# Patient Record
Sex: Male | Born: 1955 | Race: White | Hispanic: No | Smoking: Current every day smoker
Health system: Southern US, Community
[De-identification: ages and names within clinical notes are randomized; demographics above are authoritative.]

## PROBLEM LIST (undated history)

## (undated) DIAGNOSIS — H53452 Other localized visual field defect, left eye: Secondary | ICD-10-CM

## (undated) DIAGNOSIS — F1010 Alcohol abuse, uncomplicated: Secondary | ICD-10-CM

## (undated) DIAGNOSIS — I639 Cerebral infarction, unspecified: Secondary | ICD-10-CM

## (undated) DIAGNOSIS — J449 Chronic obstructive pulmonary disease, unspecified: Secondary | ICD-10-CM

## (undated) DIAGNOSIS — R0600 Dyspnea, unspecified: Secondary | ICD-10-CM

## (undated) DIAGNOSIS — J189 Pneumonia, unspecified organism: Secondary | ICD-10-CM

## (undated) DIAGNOSIS — H5440 Blindness, one eye, unspecified eye: Secondary | ICD-10-CM

## (undated) DIAGNOSIS — I10 Essential (primary) hypertension: Secondary | ICD-10-CM

## (undated) DIAGNOSIS — J869 Pyothorax without fistula: Secondary | ICD-10-CM

## (undated) DIAGNOSIS — I739 Peripheral vascular disease, unspecified: Secondary | ICD-10-CM

## (undated) HISTORY — PX: TALC PLEURODESIS: SHX2506

## (undated) HISTORY — PX: KNEE SURGERY: SHX244

## (undated) HISTORY — PX: BACK SURGERY: SHX140

## (undated) HISTORY — PX: CLAVICLE SURGERY: SHX598

---
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
## 2018-12-25 DIAGNOSIS — J189 Pneumonia, unspecified organism: Secondary | ICD-10-CM

## 2018-12-25 HISTORY — DX: Pneumonia, unspecified organism: J18.9

## 2019-09-07 ENCOUNTER — Other Ambulatory Visit: Payer: Self-pay

## 2019-09-07 ENCOUNTER — Inpatient Hospital Stay (HOSPITAL_COMMUNITY): Payer: Medicaid Other

## 2019-09-07 ENCOUNTER — Encounter (HOSPITAL_COMMUNITY): Payer: Self-pay | Admitting: Emergency Medicine

## 2019-09-07 ENCOUNTER — Emergency Department (HOSPITAL_COMMUNITY): Payer: Medicaid Other

## 2019-09-07 ENCOUNTER — Inpatient Hospital Stay (HOSPITAL_COMMUNITY)
Admission: EM | Admit: 2019-09-07 | Discharge: 2019-09-09 | DRG: 190 | Disposition: A | Discharge status: Home / Self Care | Payer: Medicaid Other | Attending: Family Medicine | Admitting: Family Medicine

## 2019-09-07 DIAGNOSIS — I6523 Occlusion and stenosis of bilateral carotid arteries: Secondary | ICD-10-CM | POA: Diagnosis present

## 2019-09-07 DIAGNOSIS — J9601 Acute respiratory failure with hypoxia: Secondary | ICD-10-CM | POA: Diagnosis present

## 2019-09-07 DIAGNOSIS — F17210 Nicotine dependence, cigarettes, uncomplicated: Secondary | ICD-10-CM | POA: Diagnosis present

## 2019-09-07 DIAGNOSIS — R0789 Other chest pain: Secondary | ICD-10-CM

## 2019-09-07 DIAGNOSIS — K8020 Calculus of gallbladder without cholecystitis without obstruction: Secondary | ICD-10-CM | POA: Diagnosis present

## 2019-09-07 DIAGNOSIS — I739 Peripheral vascular disease, unspecified: Secondary | ICD-10-CM | POA: Diagnosis present

## 2019-09-07 DIAGNOSIS — I771 Stricture of artery: Secondary | ICD-10-CM

## 2019-09-07 DIAGNOSIS — I10 Essential (primary) hypertension: Secondary | ICD-10-CM | POA: Diagnosis present

## 2019-09-07 DIAGNOSIS — W19XXXA Unspecified fall, initial encounter: Secondary | ICD-10-CM | POA: Diagnosis present

## 2019-09-07 DIAGNOSIS — R0902 Hypoxemia: Secondary | ICD-10-CM

## 2019-09-07 DIAGNOSIS — F10231 Alcohol dependence with withdrawal delirium: Secondary | ICD-10-CM | POA: Diagnosis present

## 2019-09-07 DIAGNOSIS — H548 Legal blindness, as defined in USA: Secondary | ICD-10-CM | POA: Diagnosis present

## 2019-09-07 DIAGNOSIS — J440 Chronic obstructive pulmonary disease with acute lower respiratory infection: Secondary | ICD-10-CM | POA: Diagnosis present

## 2019-09-07 DIAGNOSIS — Z20828 Contact with and (suspected) exposure to other viral communicable diseases: Secondary | ICD-10-CM | POA: Diagnosis present

## 2019-09-07 DIAGNOSIS — R55 Syncope and collapse: Secondary | ICD-10-CM

## 2019-09-07 DIAGNOSIS — J441 Chronic obstructive pulmonary disease with (acute) exacerbation: Principal | ICD-10-CM

## 2019-09-07 DIAGNOSIS — F10931 Alcohol use, unspecified with withdrawal delirium: Secondary | ICD-10-CM | POA: Diagnosis present

## 2019-09-07 DIAGNOSIS — R945 Abnormal results of liver function studies: Secondary | ICD-10-CM

## 2019-09-07 DIAGNOSIS — J189 Pneumonia, unspecified organism: Secondary | ICD-10-CM | POA: Diagnosis not present

## 2019-09-07 DIAGNOSIS — J449 Chronic obstructive pulmonary disease, unspecified: Secondary | ICD-10-CM | POA: Diagnosis present

## 2019-09-07 DIAGNOSIS — K7010 Alcoholic hepatitis without ascites: Secondary | ICD-10-CM | POA: Diagnosis present

## 2019-09-07 HISTORY — DX: Essential (primary) hypertension: I10

## 2019-09-07 HISTORY — DX: Alcohol abuse, uncomplicated: F10.10

## 2019-09-07 HISTORY — DX: Other localized visual field defect, left eye: H53.452

## 2019-09-07 HISTORY — DX: Chronic obstructive pulmonary disease, unspecified: J44.9

## 2019-09-07 HISTORY — DX: Pyothorax without fistula: J86.9

## 2019-09-07 HISTORY — DX: Blindness, one eye, unspecified eye: H54.40

## 2019-09-07 LAB — COMPREHENSIVE METABOLIC PANEL
ALT: 64 U/L — ABNORMAL HIGH (ref 0–44)
ALT: 63 U/L — ABNORMAL HIGH (ref 0–44)
AST: 56 U/L — ABNORMAL HIGH (ref 15–41)
AST: 52 U/L — ABNORMAL HIGH (ref 15–41)
Albumin: 4.1 g/dL (ref 3.5–5.0)
Albumin: 4 g/dL (ref 3.5–5.0)
Alkaline Phosphatase: 62 U/L (ref 38–126)
Alkaline Phosphatase: 49 U/L (ref 38–126)
Anion gap: 13 (ref 5–15)
Anion gap: 14 (ref 5–15)
BUN: 9 mg/dL (ref 8–23)
BUN: 9 mg/dL (ref 8–23)
CO2: 23 mmol/L (ref 22–32)
CO2: 20 mmol/L — ABNORMAL LOW (ref 22–32)
Calcium: 8.4 mg/dL — ABNORMAL LOW (ref 8.9–10.3)
Calcium: 8.4 mg/dL — ABNORMAL LOW (ref 8.9–10.3)
Chloride: 103 mmol/L (ref 98–111)
Chloride: 106 mmol/L (ref 98–111)
Creatinine, Ser: 0.72 mg/dL (ref 0.61–1.24)
Creatinine, Ser: 0.6 mg/dL — ABNORMAL LOW (ref 0.61–1.24)
GFR calc Af Amer: >60 mL/min (ref >60)
GFR calc Af Amer: >60 mL/min (ref >60)
GFR calc non Af Amer: >60 mL/min (ref >60)
GFR calc non Af Amer: >60 mL/min (ref >60)
Glucose, Bld: 141 mg/dL — ABNORMAL HIGH (ref 70–99)
Glucose, Bld: 173 mg/dL — ABNORMAL HIGH (ref 70–99)
Potassium: 4.1 mmol/L (ref 3.5–5.1)
Potassium: 3.9 mmol/L (ref 3.5–5.1)
Sodium: 139 mmol/L (ref 135–145)
Sodium: 140 mmol/L (ref 135–145)
Total Bilirubin: 1 mg/dL (ref 0.3–1.2)
Total Bilirubin: 1.1 mg/dL (ref 0.3–1.2)
Total Protein: 7.8 g/dL (ref 6.5–8.1)
Total Protein: 8 g/dL (ref 6.5–8.1)

## 2019-09-07 LAB — PHOSPHORUS: Phosphorus: 2.4 mg/dL — ABNORMAL LOW (ref 2.5–4.6)

## 2019-09-07 LAB — CBC
HCT: 53.8 % — ABNORMAL HIGH (ref 39.0–52.0)
Hemoglobin: 18.4 g/dL — ABNORMAL HIGH (ref 13.0–17.0)
MCH: 34.1 pg — ABNORMAL HIGH (ref 26.0–34.0)
MCHC: 34.2 g/dL (ref 30.0–36.0)
MCV: 99.8 fL (ref 80.0–100.0)
Platelets: 152 10*3/uL (ref 150–400)
RBC: 5.39 MIL/uL (ref 4.22–5.81)
RDW: 12.6 % (ref 11.5–15.5)
WBC: 5.5 10*3/uL (ref 4.0–10.5)
nRBC: 0 % (ref 0.0–0.2)

## 2019-09-07 LAB — ETHANOL: Alcohol, Ethyl (B): 139 mg/dL — ABNORMAL HIGH (ref <10)

## 2019-09-07 LAB — CBC WITH DIFFERENTIAL/PLATELET
Abs Immature Granulocytes: 0.03 10*3/uL (ref 0.00–0.07)
Basophils Absolute: 0.1 10*3/uL (ref 0.0–0.1)
Basophils Relative: 1 %
Eosinophils Absolute: 0.1 10*3/uL (ref 0.0–0.5)
Eosinophils Relative: 1 %
HCT: 55.1 % — ABNORMAL HIGH (ref 39.0–52.0)
Hemoglobin: 18.5 g/dL — ABNORMAL HIGH (ref 13.0–17.0)
Immature Granulocytes: 0 %
Lymphocytes Relative: 32 %
Lymphs Abs: 2.2 10*3/uL (ref 0.7–4.0)
MCH: 33.5 pg (ref 26.0–34.0)
MCHC: 33.6 g/dL (ref 30.0–36.0)
MCV: 99.6 fL (ref 80.0–100.0)
Monocytes Absolute: 0.4 10*3/uL (ref 0.1–1.0)
Monocytes Relative: 6 %
Neutro Abs: 4.2 10*3/uL (ref 1.7–7.7)
Neutrophils Relative %: 60 %
Platelets: 191 10*3/uL (ref 150–400)
RBC: 5.53 MIL/uL (ref 4.22–5.81)
RDW: 12.7 % (ref 11.5–15.5)
WBC: 6.9 10*3/uL (ref 4.0–10.5)
nRBC: 0 % (ref 0.0–0.2)

## 2019-09-07 LAB — SARS CORONAVIRUS 2 BY RT PCR (HOSPITAL ORDER, PERFORMED IN ~~LOC~~ HOSPITAL LAB): SARS Coronavirus 2: NEGATIVE

## 2019-09-07 LAB — CK TOTAL AND CKMB (NOT AT ARMC)
CK, MB: 4.7 ng/mL (ref 0.5–5.0)
Relative Index: 4.3 — ABNORMAL HIGH (ref 0.0–2.5)
Total CK: 109 U/L (ref 49–397)

## 2019-09-07 LAB — BLOOD GAS, ARTERIAL
Acid-base deficit: 5 mmol/L — ABNORMAL HIGH (ref 0.0–2.0)
Bicarbonate: 20.2 mmol/L (ref 20.0–28.0)
FIO2: 21
O2 Saturation: 88 %
Patient temperature: 37
pCO2 arterial: 36.6 mmHg (ref 32.0–48.0)
pH, Arterial: 7.348 — ABNORMAL LOW (ref 7.350–7.450)
pO2, Arterial: 59.9 mmHg — ABNORMAL LOW (ref 83.0–108.0)

## 2019-09-07 LAB — TROPONIN I (HIGH SENSITIVITY): Troponin I (High Sensitivity): 5 ng/L (ref <18)

## 2019-09-07 LAB — STREP PNEUMONIAE URINARY ANTIGEN: Strep Pneumo Urinary Antigen: NEGATIVE

## 2019-09-07 LAB — MAGNESIUM: Magnesium: 1.9 mg/dL (ref 1.7–2.4)

## 2019-09-07 MED ORDER — ALBUTEROL SULFATE (2.5 MG/3ML) 0.083% IN NEBU
2.5000 mg | INHALATION_SOLUTION | Freq: Four times a day (QID) | RESPIRATORY_TRACT | Status: DC
  Administered 2019-09-07 – 2019-09-09 (×10): 2.5 mg via RESPIRATORY_TRACT
  Filled 2019-09-07 (×11): qty 3

## 2019-09-07 MED ORDER — MORPHINE SULFATE (PF) 4 MG/ML IV SOLN
4.0000 mg | Freq: Four times a day (QID) | INTRAVENOUS | Status: DC | PRN
  Administered 2019-09-07 – 2019-09-09 (×5): 4 mg via INTRAVENOUS
  Filled 2019-09-07 (×5): qty 1

## 2019-09-07 MED ORDER — SODIUM CHLORIDE 0.9 % IV SOLN
500.0000 mg | INTRAVENOUS | Status: DC
  Administered 2019-09-08 – 2019-09-09 (×2): 500 mg via INTRAVENOUS
  Filled 2019-09-07 (×2): qty 500

## 2019-09-07 MED ORDER — SODIUM CHLORIDE 0.9 % IV SOLN
500.0000 mg | Freq: Once | INTRAVENOUS | Status: AC
  Administered 2019-09-07: 500 mg via INTRAVENOUS
  Filled 2019-09-07: qty 500

## 2019-09-07 MED ORDER — CHLORDIAZEPOXIDE HCL 25 MG PO CAPS
50.0000 mg | ORAL_CAPSULE | Freq: Once | ORAL | Status: DC
  Filled 2019-09-07: qty 2

## 2019-09-07 MED ORDER — ACETAMINOPHEN 650 MG RE SUPP: 650.0000 mg | Freq: Four times a day (QID) | RECTAL | Status: DC | PRN

## 2019-09-07 MED ORDER — ACETAMINOPHEN 325 MG PO TABS: 650.0000 mg | ORAL_TABLET | Freq: Four times a day (QID) | ORAL | Status: DC | PRN

## 2019-09-07 MED ORDER — ALBUTEROL SULFATE HFA 108 (90 BASE) MCG/ACT IN AERS
8.0000 | INHALATION_SPRAY | Freq: Once | RESPIRATORY_TRACT | Status: AC
  Administered 2019-09-07: 02:00:00 8 via RESPIRATORY_TRACT
  Filled 2019-09-07: qty 6.7

## 2019-09-07 MED ORDER — SODIUM CHLORIDE 0.9 % IV SOLN
1.0000 g | INTRAVENOUS | Status: DC
  Administered 2019-09-08 – 2019-09-09 (×2): 1 g via INTRAVENOUS
  Filled 2019-09-07 (×2): qty 10

## 2019-09-07 MED ORDER — CYCLOBENZAPRINE HCL 10 MG PO TABS
10.0000 mg | ORAL_TABLET | Freq: Once | ORAL | Status: AC
  Administered 2019-09-07: 10 mg via ORAL
  Filled 2019-09-07: qty 1

## 2019-09-07 MED ORDER — THIAMINE HCL 100 MG/ML IJ SOLN
100.0000 mg | Freq: Every day | INTRAMUSCULAR | Status: DC
  Filled 2019-09-07 (×2): qty 2

## 2019-09-07 MED ORDER — KETOROLAC TROMETHAMINE 30 MG/ML IJ SOLN
30.0000 mg | Freq: Once | INTRAMUSCULAR | Status: AC
  Administered 2019-09-07: 04:00:00 30 mg via INTRAVENOUS
  Filled 2019-09-07: qty 1

## 2019-09-07 MED ORDER — METHYLPREDNISOLONE SODIUM SUCC 40 MG IJ SOLR
40.0000 mg | Freq: Two times a day (BID) | INTRAMUSCULAR | Status: DC
  Administered 2019-09-07 – 2019-09-09 (×4): 40 mg via INTRAVENOUS
  Filled 2019-09-07 (×4): qty 1

## 2019-09-07 MED ORDER — VITAMIN B-1 100 MG PO TABS
100.0000 mg | ORAL_TABLET | Freq: Every day | ORAL | Status: DC
  Administered 2019-09-07 – 2019-09-09 (×3): 100 mg via ORAL
  Filled 2019-09-07 (×3): qty 1

## 2019-09-07 MED ORDER — CHLORDIAZEPOXIDE HCL 25 MG PO CAPS
25.0000 mg | ORAL_CAPSULE | Freq: Once | ORAL | Status: AC
  Administered 2019-09-08: 25 mg via ORAL
  Filled 2019-09-07: qty 1

## 2019-09-07 MED ORDER — FOLIC ACID 1 MG PO TABS
1.0000 mg | ORAL_TABLET | Freq: Every day | ORAL | Status: DC
  Administered 2019-09-07 – 2019-09-09 (×3): 1 mg via ORAL
  Filled 2019-09-07 (×3): qty 1

## 2019-09-07 MED ORDER — SODIUM CHLORIDE 0.9 % IV SOLN: 250.0000 mL | INTRAVENOUS | Status: DC | PRN

## 2019-09-07 MED ORDER — SODIUM CHLORIDE 0.9% FLUSH: 3.0000 mL | INTRAVENOUS | Status: DC | PRN

## 2019-09-07 MED ORDER — AMLODIPINE BESYLATE 5 MG PO TABS
10.0000 mg | ORAL_TABLET | Freq: Every day | ORAL | Status: DC
  Administered 2019-09-07 – 2019-09-09 (×3): 10 mg via ORAL
  Filled 2019-09-07 (×3): qty 2

## 2019-09-07 MED ORDER — ALBUTEROL SULFATE (2.5 MG/3ML) 0.083% IN NEBU: 2.5000 mg | INHALATION_SOLUTION | Freq: Four times a day (QID) | RESPIRATORY_TRACT | Status: DC | PRN

## 2019-09-07 MED ORDER — ENOXAPARIN SODIUM 40 MG/0.4ML ~~LOC~~ SOLN
40.0000 mg | SUBCUTANEOUS | Status: DC
  Administered 2019-09-08 – 2019-09-09 (×2): 40 mg via SUBCUTANEOUS
  Filled 2019-09-07 (×2): qty 0.4

## 2019-09-07 MED ORDER — METHYLPREDNISOLONE SODIUM SUCC 125 MG IJ SOLR
80.0000 mg | Freq: Three times a day (TID) | INTRAMUSCULAR | Status: DC
  Administered 2019-09-07: 80 mg via INTRAVENOUS
  Filled 2019-09-07: qty 2

## 2019-09-07 MED ORDER — LORAZEPAM 2 MG/ML IJ SOLN
0.0000 mg | Freq: Four times a day (QID) | INTRAMUSCULAR | Status: AC
  Administered 2019-09-07: 2 mg via INTRAVENOUS
  Administered 2019-09-07: 12:00:00 1 mg via INTRAVENOUS
  Administered 2019-09-08: 18:00:00 2 mg via INTRAVENOUS
  Filled 2019-09-07 (×4): qty 1

## 2019-09-07 MED ORDER — IOHEXOL 350 MG/ML SOLN
100.0000 mL | Freq: Once | INTRAVENOUS | Status: AC | PRN
  Administered 2019-09-07: 09:00:00 100 mL via INTRAVENOUS

## 2019-09-07 MED ORDER — PERFLUTREN LIPID MICROSPHERE
1.0000 mL | INTRAVENOUS | Status: AC | PRN
  Administered 2019-09-07: 4 mL via INTRAVENOUS
  Filled 2019-09-07: qty 10

## 2019-09-07 MED ORDER — LORAZEPAM 2 MG/ML IJ SOLN
1.0000 mg | INTRAMUSCULAR | Status: DC | PRN
  Administered 2019-09-07 – 2019-09-08 (×2): 2 mg via INTRAVENOUS
  Filled 2019-09-07 (×3): qty 1

## 2019-09-07 MED ORDER — LORAZEPAM 2 MG/ML IJ SOLN
0.0000 mg | Freq: Two times a day (BID) | INTRAMUSCULAR | Status: DC
  Administered 2019-09-09: 2 mg via INTRAVENOUS

## 2019-09-07 MED ORDER — NICOTINE 21 MG/24HR TD PT24
21.0000 mg | MEDICATED_PATCH | Freq: Every day | TRANSDERMAL | Status: DC | PRN
  Filled 2019-09-07: qty 1

## 2019-09-07 MED ORDER — UMECLIDINIUM BROMIDE 62.5 MCG/INH IN AEPB
1.0000 | INHALATION_SPRAY | Freq: Every day | RESPIRATORY_TRACT | Status: DC
  Administered 2019-09-07 – 2019-09-09 (×3): 1 via RESPIRATORY_TRACT
  Filled 2019-09-07: qty 7

## 2019-09-07 MED ORDER — OXYCODONE HCL 5 MG PO TABS
5.0000 mg | ORAL_TABLET | ORAL | Status: DC | PRN
  Administered 2019-09-07 – 2019-09-09 (×5): 5 mg via ORAL
  Filled 2019-09-07 (×6): qty 1

## 2019-09-07 MED ORDER — LORAZEPAM 1 MG PO TABS: 1.0000 mg | ORAL_TABLET | ORAL | Status: DC | PRN

## 2019-09-07 MED ORDER — ADULT MULTIVITAMIN W/MINERALS CH
1.0000 | ORAL_TABLET | Freq: Every day | ORAL | Status: DC
  Administered 2019-09-07 – 2019-09-09 (×3): 1 via ORAL
  Filled 2019-09-07 (×3): qty 1

## 2019-09-07 MED ORDER — LABETALOL HCL 5 MG/ML IV SOLN
10.0000 mg | INTRAVENOUS | Status: DC | PRN
  Administered 2019-09-07 (×2): 10 mg via INTRAVENOUS
  Filled 2019-09-07: qty 4

## 2019-09-07 MED ORDER — METHYLPREDNISOLONE SODIUM SUCC 125 MG IJ SOLR
125.0000 mg | Freq: Once | INTRAMUSCULAR | Status: AC
  Administered 2019-09-07: 02:00:00 125 mg via INTRAVENOUS
  Filled 2019-09-07: qty 2

## 2019-09-07 MED ORDER — SODIUM CHLORIDE 0.9 % IV SOLN
1.0000 g | Freq: Once | INTRAVENOUS | Status: AC
  Administered 2019-09-07: 04:00:00 1 g via INTRAVENOUS
  Filled 2019-09-07: qty 10

## 2019-09-07 MED ORDER — MORPHINE SULFATE (PF) 2 MG/ML IV SOLN
1.0000 mg | INTRAVENOUS | Status: DC | PRN
  Administered 2019-09-07 (×2): 1 mg via INTRAVENOUS
  Filled 2019-09-07 (×2): qty 1

## 2019-09-07 NOTE — ED Provider Notes (Signed)
[facility] EMERGENCY DEPARTMENT Provider Note   CSN: [id number] Arrival date & time: 09/07/19  0118   Time seen 1:34 AM  History   Chief Complaint Chief Complaint  Patient presents with  . Shortness of Breath    HPI Nathan Hart is a 63 y.o. male.     HPI patient states he has not seen a doctor in 3 years.  He states he has been diagnosed with COPD but he is not on home oxygen or any specific medications for it.  He states he continues to smoke 1-1/2 packs a day.  He relates he also had pneumococcal pneumonia about 10 years ago and had a thoracotomy done I suspect for a empyema.  He states he has a chronic daily cough that he states is not any worse.  He is unaware of fever and denies chills.  He has chronic rhinorrhea.  He denies sore throat, nausea, vomiting, or diarrhea.  He states he started feeling worse the evening of the 11th with cramps in his ribs and wheezing.  Patient denies being around anybody else who is sick.  He states he has been living in a camper at his son's house so he can avoid social interaction due to [place].  PCP none  Past Medical History:  Diagnosis Date  . COPD (chronic obstructive pulmonary disease) (HCC)     There are no active problems to display for this patient.   History reviewed. No pertinent surgical history.      Home Medications    Prior to Admission medications   Not on File    Family History No family history on file.  Social History Social History   Tobacco Use  . Smoking status: Not on file  Substance Use Topics  . Alcohol use: Not on file  . Drug use: Not on file  Patient moved here 3 years ago Patient states he is living in a camper at his son's house Patient is on disability for being legally blind   Allergies   Patient has no allergy information on record.   Review of Systems Review of Systems  All other systems reviewed and are negative.    Physical Exam Updated Vital Signs BP (!) 146/86    Pulse 95   Temp 98.4 F (36.9 C) (Oral)   Resp (!) 29   Ht 5\' 10"  (1.778 m)   Wt 79.4 kg   SpO2 91%   BMI 25.11 kg/m   Physical Exam Vitals signs and nursing note reviewed.  Constitutional:      General: He is in acute distress.     Appearance: He is well-developed.  HENT:     Head: Normocephalic and atraumatic.     Right Ear: External ear normal.     Left Ear: External ear normal.     Mouth/Throat:     Mouth: Mucous membranes are dry.  Eyes:     Extraocular Movements: Extraocular movements intact.     Conjunctiva/sclera: Conjunctivae normal.     Pupils: Pupils are equal, round, and reactive to light.  Neck:     Musculoskeletal: Normal range of motion.  Cardiovascular:     Rate and Rhythm: Normal rate and regular rhythm.  Pulmonary:     [place]: Tachypnea, accessory muscle usage, prolonged expiration and respiratory distress present.     Breath sounds: Decreased breath sounds and wheezing present.     Comments: Patient is clutching his chest when he tries to move. Musculoskeletal: Normal range of motion.  General: No swelling or tenderness.  Skin:    General: Skin is warm and dry.  Neurological:     General: No focal deficit present.     Mental Status: He is alert and oriented to person, place, and time.     Cranial Nerves: No cranial nerve deficit.  Psychiatric:        Mood and Affect: Mood is anxious.        Speech: Speech is rapid and pressured.        Behavior: Behavior is cooperative.      ED Treatments / Results  Labs (all labs ordered are listed, but only abnormal results are displayed) Results for orders placed or performed during the hospital encounter of 09/07/19  SARS Coronavirus 2 [facility] order, Performed in [facility] hospital lab) Nasopharyngeal Nasopharyngeal Swab   Specimen: Nasopharyngeal Swab  Result Value Ref Range   SARS Coronavirus 2 NEGATIVE NEGATIVE  Comprehensive metabolic panel  Result Value Ref Range   Sodium 139 135 - 145  mmol/L   Potassium 4.1 3.5 - 5.1 mmol/L   Chloride 103 98 - 111 mmol/L   CO2 23 22 - 32 mmol/L   Glucose, Bld 141 (H) 70 - 99 mg/dL   BUN 9 8 - 23 mg/dL   Creatinine, Ser 0.72 0.61 - 1.24 mg/dL   Calcium 8.4 (L) 8.9 - 10.3 mg/dL   Total Protein 7.8 6.5 - 8.1 g/dL   Albumin 4.1 3.5 - 5.0 g/dL   AST 56 (H) 15 - 41 U/L   ALT 64 (H) 0 - 44 U/L   Alkaline Phosphatase 62 38 - 126 U/L   Total Bilirubin 1.0 0.3 - 1.2 mg/dL   GFR calc non Af Amer >60 >60 mL/min   GFR calc Af Amer >60 >60 mL/min   Anion gap 13 5 - 15  CBC with Differential  Result Value Ref Range   WBC 6.9 4.0 - 10.5 K/uL   RBC 5.53 4.22 - 5.81 MIL/uL   Hemoglobin 18.5 (H) 13.0 - 17.0 g/dL   HCT 55.1 (H) 39.0 - 52.0 %   MCV 99.6 80.0 - 100.0 fL   MCH 33.5 26.0 - 34.0 pg   MCHC 33.6 30.0 - 36.0 g/dL   RDW 12.7 11.5 - 15.5 %   Platelets 191 150 - 400 K/uL   nRBC 0.0 0.0 - 0.2 %   Neutrophils Relative % 60 %   Neutro Abs 4.2 1.7 - 7.7 K/uL   Lymphocytes Relative 32 %   Lymphs Abs 2.2 0.7 - 4.0 K/uL   Monocytes Relative 6 %   Monocytes Absolute 0.4 0.1 - 1.0 K/uL   Eosinophils Relative 1 %   Eosinophils Absolute 0.1 0.0 - 0.5 K/uL   Basophils Relative 1 %   Basophils Absolute 0.1 0.0 - 0.1 K/uL   Immature Granulocytes 0 %   Abs Immature Granulocytes 0.03 0.00 - 0.07 K/uL  Blood gas, arterial  Result Value Ref Range   FIO2 21.00    pH, Arterial 7.348 (L) 7.350 - 7.450   pCO2 arterial 36.6 32.0 - 48.0 mmHg   pO2, Arterial 59.9 (L) 83.0 - 108.0 mmHg   Bicarbonate 20.2 20.0 - 28.0 mmol/L   Acid-base deficit 5.0 (H) 0.0 - 2.0 mmol/L   O2 Saturation 88.0 %   Patient temperature 37.0    Allens test (pass/fail) PASS PASS   Laboratory interpretation all normal except hypoxia on room air ABG, elevation of LFTs, elevated hemoglobin without elevated white blood cell  count   EKG EKG Interpretation  Date/Time:  Sunday September 07 2019 01:23:53 EDT Ventricular Rate:  94 PR Interval:    QRS Duration: 91 QT Interval:   363 QTC Calculation: 454 R Axis:   80 Text Interpretation:  Sinus rhythm Anteroseptal infarct, old Borderline repolarization abnormality Baseline wander in lead(s) II III aVF Electrode noise No old tracing to compare Confirmed by Rolland Porter [id number]) on 09/07/2019 1:31:55 AM   Radiology Dg Chest Port 1 View  Result Date: 09/07/2019 CLINICAL DATA:  Wheezing and shortness of breath. History of COPD. EXAM: PORTABLE CHEST 1 VIEW COMPARISON:  None. FINDINGS: Heart is normal in size. Aortic atherosclerosis. Mild bilateral hilar prominence. There is central peribronchial thickening. Patchy bibasilar opacities. No pleural effusion, right costophrenic angle not entirely included in the field of view. No pneumothorax. IMPRESSION: 1. Central bronchial thickening. Patchy bibasilar opacities, atelectasis versus pneumonia. 2. Mild bilateral hilar prominence is likely related to overlapping vascular structures, but nonspecific. Aortic Atherosclerosis (ICD10-I70.0). Electronically Signed   By: Keith Rake M.D.   On: 09/07/2019 02:08    Procedures .Critical Care Performed by: Rolland Porter, MD Authorized by: Rolland Porter, MD   Critical care provider statement:    Critical care time (minutes):  33   Critical care was necessary to treat or prevent imminent or life-threatening deterioration of the following conditions:  Respiratory failure   Critical care was time spent personally by me on the following activities:  Discussions with consultants, examination of patient, ordering and review of laboratory studies, ordering and review of radiographic studies, pulse oximetry and re-evaluation of patient's condition   (including critical care time)  Medications Ordered in ED Medications  azithromycin (ZITHROMAX) 500 mg in sodium chloride 0.9 % 250 mL IVPB (500 mg Intravenous New Bag/Given 09/07/19 0342)  albuterol (VENTOLIN HFA) 108 (90 Base) MCG/ACT inhaler 8 puff (8 puffs Inhalation Given 09/07/19 0156)   methylPREDNISolone sodium succinate (SOLU-MEDROL) 125 mg/2 mL injection 125 mg (125 mg Intravenous Given 09/07/19 0207)  cefTRIAXone (ROCEPHIN) 1 g in sodium chloride 0.9 % 100 mL IVPB (0 g Intravenous Stopped 09/07/19 0416)  ketorolac (TORADOL) 30 MG/ML injection 30 mg (30 mg Intravenous Given 09/07/19 0337)  cyclobenzaprine (FLEXERIL) tablet 10 mg (10 mg Oral Given 09/07/19 [id number])     Initial Impression / Assessment and Plan / ED Course  I have reviewed the triage vital signs and the nursing notes.  Pertinent labs & imaging results that were available during my care of the patient were reviewed by me and considered in my medical decision making (see chart for details).    Patient has no old records here.   Patient was given albuterol inhaler x8 puffs, and IV Solu-Medrol for suspected COPD exacerbation.  Recheck at 3:30 AM patient is very dramatic about his chest wall pain.  He was given IV Toradol and oral Flexeril.  When I listen to email the wheezing is gone however he has rales at the bases.  When I look in his x-ray they described bibasilar infiltrates.  He was started on community-acquired pneumonia antibiotics.  4:15 AM Dr. Maudie Mercury, hospitalist will admit  Final Clinical Impressions(s) / ED Diagnoses   Final diagnoses:  COPD exacerbation ([place])  Hypoxia  Community acquired pneumonia, unspecified laterality  Chest wall pain    Plan admission  Rolland Porter, MD, Barbette Or, MD 09/07/19 [id number]

## 2019-09-07 NOTE — Progress Notes (Signed)
  Echocardiogram 2D Echocardiogram with definity has been performed.  Darlina Sicilian M 09/07/2019, 4:04 PM

## 2019-09-07 NOTE — H&P (Signed)
TRH H&P    Patient Demographics:    Nathan Hart, is a 63 y.o. male  MRN: [id number]  DOB - December 17, 1956  Admit Date - 09/07/2019  Referring MD/NP/PA:   Rolland Porter  Outpatient Primary MD for the patient is Patient, No Pcp Per  Patient coming from:  home  Chief complaint-  Chest pain, syncope, cough   HPI:    Nathan Hart  is a 63 y.o. male,  w ETOH abuse, Tobacco dep, Copd, not on home o2, apparently was found on the floor and does not recall how he got there.  Pt notes right sided chest pain worse with cough, slight dyspnea.   Pt thinks that he might have cracked a rib. Possibly fell against something.  Pt denies fever, chills, productive cough, palp, n/v, abd pain, diarrhea, brbpr, black stool, dysuria, hematuria.  Drinks about 20 beers this past week , maybe more.   In ED,  T 98.4, P 97 R 32 Bp 177/98 pox 89% on RA  Wt 79.4 kg   CXR IMPRESSION: 1. Central bronchial thickening. Patchy bibasilar opacities, atelectasis versus pneumonia. 2. Mild bilateral hilar prominence is likely related to overlapping vascular structures, but nonspecific.  coivd-19 negative Na 139, K 4.1, Bun 9, Creatinine 0.72 Ast 56, Alt 64, Alk phos 62, T. bili 1.0 Wbc 6.9, Hgb 18.5, Plt 191  PH 7.348, Co2 36.6, Po2 59.9  Pt will be admitted for Copd exacerbation, ? CAP and abnormal liver function and ? Syncope vs inebriation.      Review of systems:    In addition to the HPI above,  No Fever-chills, No Headache, No changes with Vision or hearing, No problems swallowing food or Liquids, + slight sob No Abdominal pain, No Nausea or Vomiting, bowel movements are regular, No Blood in stool or Urine, No dysuria, No new skin rashes or bruises, No new joints pains-aches,  No new weakness, tingling, numbness in any extremity, No recent weight gain or loss, No polyuria, polydypsia or polyphagia, No significant  Mental Stressors.  All other systems reviewed and are negative.    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    COPD (chronic obstructive pulmonary disease) (HCC)    not on home o2   HTN (hypertension)       Past Surgical History:  Procedure Laterality Date   BACK SURGERY     CLAVICLE SURGERY     KNEE SURGERY     TALC PLEURODESIS     [place], [place]      Social History:      Social History   Tobacco Use   Smoking status: Current Every Day Smoker    Types: Cigarettes   Smokeless tobacco: Never Used  Substance Use Topics   Alcohol use: Yes    Alcohol/week: 20.0 standard drinks    Types: 20 Cans of beer per week       Family History :  Family History  Problem Relation Age of Onset   Cancer Mother        Home Medications:   Prior to Admission medications   Not on File     Allergies:    No Known Allergies   Physical Exam:   Vitals  Blood pressure (!) 155/98, pulse 97, temperature 98.4 F (36.9 C), temperature source Oral, resp. rate (!) 29, height '5\' 10"'  (1.778 m), weight 81.8 kg, SpO2 93 %.  1.  General: axoxo3  2. Psychiatric: euthymic  3. Neurologic: Nonfocal, cn 2-12 intact, reflexes 2+ symmetric, diffuse with no clonus, motor 5/5 in all 4 ext  4. HEENMT:  Anicteric, pupils 1.19m symmetric, direct, consensual, near intact Neck: no jvd  5. Respiratory : Slight bilateral exp wheeze, slight crackles right lung base  6. Cardiovascular : rrr s1, s2, no m/g/r,  R rib pain with palpation  7. Gastrointestinal:  Abd: soft, nt, nd, +bs  8. Skin:  Ext: no c/c/e,  No rash  9.Musculoskeletal:  Good ROM    Data Review:    CBC Recent Labs  Lab 09/07/19 0140  WBC 6.9  HGB 18.5*  HCT 55.1*  PLT 191  MCV 99.6  MCH 33.5  MCHC 33.6  RDW 12.7  LYMPHSABS 2.2  MONOABS 0.4  EOSABS 0.1  BASOSABS 0.1    ------------------------------------------------------------------------------------------------------------------  Results for orders placed or performed during the hospital encounter of 09/07/19 (from the past 48 hour(s))  SARS Coronavirus 2 ([facility], [facility]) Nasopharyngeal Nasopharyngeal Swab     Status: None   Collection Time: 09/07/19  1:29 AM   Specimen: Nasopharyngeal Swab  Result Value Ref Range   SARS Coronavirus 2 NEGATIVE NEGATIVE    Comment: (NOTE) If result is NEGATIVE SARS-CoV-2 target nucleic acids are NOT DETECTED. The SARS-CoV-2 RNA is generally detectable in upper and lower  respiratory specimens during the acute phase of infection. The lowest  concentration of SARS-CoV-2 viral copies this assay can detect is 250  copies / mL. A negative result does not preclude SARS-CoV-2 infection  and should not be used as the sole basis for treatment or other  patient management decisions.  A negative result may occur with  improper specimen collection / handling, submission of specimen other  than nasopharyngeal swab, presence of viral mutation(s) within the  areas targeted by this assay, and inadequate number of viral copies  (<250 copies / mL). A negative result must be combined with clinical  observations, patient history, and epidemiological information. If result is POSITIVE SARS-CoV-2 target nucleic acids are DETECTED. The SARS-CoV-2 RNA is generally detectable in upper and lower  respiratory specimens dur ing the acute phase of infection.  Positive  results are indicative of active infection with SARS-CoV-2.  Clinical  correlation with patient history and other diagnostic information is  necessary to determine patient infection status.  Positive results do  not rule out bacterial infection or co-infection with other viruses. If result is PRESUMPTIVE POSTIVE SARS-CoV-2 nucleic acids MAY BE PRESENT.   A presumptive positive result  was obtained on the submitted specimen  and confirmed on repeat testing.  While 2019 novel coronavirus  (SARS-CoV-2) nucleic acids may be present in the submitted sample  additional confirmatory testing may be necessary for epidemiological  and / or clinical management purposes  to differentiate between  SARS-CoV-2 and other Sarbecovirus currently known to infect humans.  If clinically indicated additional testing with an alternate test  methodology [phone] is advised. The  SARS-CoV-2 RNA is generally  detectable in upper and lower respiratory sp ecimens during the acute  phase of infection. The expected result is Negative. Fact Sheet for Patients:  StrictlyIdeas.no Fact Sheet for Healthcare Providers: BankingDealers.co.za This test is not yet approved or cleared by the [place] FDA and has been authorized for detection and/or diagnosis of SARS-CoV-2 by FDA under an Emergency Use Authorization (EUA).  This EUA will remain in effect (meaning this test can be used) for the duration of the COVID-19 declaration under Section 564(b)(1) of the Act, 21 U.S.C. section 360bbb-3(b)(1), unless the authorization is terminated or revoked sooner. Performed at [facility], [address], [place], [place] [address]   Comprehensive metabolic panel     Status: Abnormal   Collection Time: 09/07/19  1:40 AM  Result Value Ref Range   Sodium 139 135 - 145 mmol/L   Potassium 4.1 3.5 - 5.1 mmol/L   Chloride 103 98 - 111 mmol/L   CO2 23 22 - 32 mmol/L   Glucose, Bld 141 (H) 70 - 99 mg/dL   BUN 9 8 - 23 mg/dL   Creatinine, Ser 0.72 0.61 - 1.24 mg/dL   Calcium 8.4 (L) 8.9 - 10.3 mg/dL   Total Protein 7.8 6.5 - 8.1 g/dL   Albumin 4.1 3.5 - 5.0 g/dL   AST 56 (H) 15 - 41 U/L   ALT 64 (H) 0 - 44 U/L   Alkaline Phosphatase 62 38 - 126 U/L   Total Bilirubin 1.0 0.3 - 1.2 mg/dL   GFR calc non Af Amer >60 >60 mL/min   GFR calc Af Amer >60 >60 mL/min    Anion gap 13 5 - 15    Comment: Performed at [facility], [address] [place], [place] [address]  CBC with Differential     Status: Abnormal   Collection Time: 09/07/19  1:40 AM  Result Value Ref Range   WBC 6.9 4.0 - 10.5 K/uL   RBC 5.53 4.22 - 5.81 MIL/uL   Hemoglobin 18.5 (H) 13.0 - 17.0 g/dL   HCT 55.1 (H) 39.0 - 52.0 %   MCV 99.6 80.0 - 100.0 fL   MCH 33.5 26.0 - 34.0 pg   MCHC 33.6 30.0 - 36.0 g/dL   RDW 12.7 11.5 - 15.5 %   Platelets 191 150 - 400 K/uL   nRBC 0.0 0.0 - 0.2 %   Neutrophils Relative % 60 %   Neutro Abs 4.2 1.7 - 7.7 K/uL   Lymphocytes Relative 32 %   Lymphs Abs 2.2 0.7 - 4.0 K/uL   Monocytes Relative 6 %   Monocytes Absolute 0.4 0.1 - 1.0 K/uL   Eosinophils Relative 1 %   Eosinophils Absolute 0.1 0.0 - 0.5 K/uL   Basophils Relative 1 %   Basophils Absolute 0.1 0.0 - 0.1 K/uL   Immature Granulocytes 0 %   Abs Immature Granulocytes 0.03 0.00 - 0.07 K/uL    Comment: Performed at [facility], [address] [place], [place] [address]  Blood gas, arterial     Status: Abnormal   Collection Time: 09/07/19  4:00 AM  Result Value Ref Range   FIO2 21.00    pH, Arterial 7.348 (L) 7.350 - 7.450   pCO2 arterial 36.6 32.0 - 48.0 mmHg   pO2, Arterial 59.9 (L) 83.0 - 108.0 mmHg   Bicarbonate 20.2 20.0 - 28.0 mmol/L   Acid-base deficit 5.0 (H) 0.0 - 2.0 mmol/L   O2 Saturation 88.0 %   Patient  temperature 37.0    Allens test (pass/fail) PASS PASS    Comment: Performed at [facility], [address] [place], [place] [address]    Chemistries  Recent Labs  Lab 09/07/19 0140  NA 139  K 4.1  CL 103  CO2 23  GLUCOSE 141*  BUN 9  CREATININE 0.72  CALCIUM 8.4*  AST 56*  ALT 64*  ALKPHOS 62  BILITOT 1.0   ------------------------------------------------------------------------------------------------------------------  ------------------------------------------------------------------------------------------------------------------ GFR: Estimated  Creatinine Clearance: 97.6 mL/min (by C-G formula based on SCr of 0.72 mg/dL). Liver Function Tests: Recent Labs  Lab 09/07/19 0140  AST 56*  ALT 64*  ALKPHOS 62  BILITOT 1.0  PROT 7.8  ALBUMIN 4.1   No results for input(s): LIPASE, AMYLASE in the last 168 hours. No results for input(s): AMMONIA in the last 168 hours. Coagulation Profile: No results for input(s): INR, PROTIME in the last 168 hours. Cardiac Enzymes: No results for input(s): CKTOTAL, CKMB, CKMBINDEX, TROPONINI in the last 168 hours. BNP (last 3 results) No results for input(s): PROBNP in the last 8760 hours. HbA1C: No results for input(s): HGBA1C in the last 72 hours. CBG: No results for input(s): GLUCAP in the last 168 hours. Lipid Profile: No results for input(s): CHOL, HDL, LDLCALC, TRIG, CHOLHDL, LDLDIRECT in the last 72 hours. Thyroid Function Tests: No results for input(s): TSH, T4TOTAL, FREET4, T3FREE, THYROIDAB in the last 72 hours. Anemia Panel: No results for input(s): VITAMINB12, FOLATE, FERRITIN, TIBC, IRON, RETICCTPCT in the last 72 hours.  --------------------------------------------------------------------------------------------------------------- Urine analysis: No results found for: COLORURINE, APPEARANCEUR, LABSPEC, PHURINE, GLUCOSEU, HGBUR, BILIRUBINUR, KETONESUR, PROTEINUR, UROBILINOGEN, NITRITE, LEUKOCYTESUR    Imaging Results:    Dg Chest Port 1 View  Result Date: 09/07/2019 CLINICAL DATA:  Wheezing and shortness of breath. History of COPD. EXAM: PORTABLE CHEST 1 VIEW COMPARISON:  None. FINDINGS: Heart is normal in size. Aortic atherosclerosis. Mild bilateral hilar prominence. There is central peribronchial thickening. Patchy bibasilar opacities. No pleural effusion, right costophrenic angle not entirely included in the field of view. No pneumothorax. IMPRESSION: 1. Central bronchial thickening. Patchy bibasilar opacities, atelectasis versus pneumonia. 2. Mild bilateral hilar prominence is  likely related to overlapping vascular structures, but nonspecific. Aortic Atherosclerosis (ICD10-I70.0). Electronically Signed   By: Keith Rake M.D.   On: 09/07/2019 02:08    nsr at 94, nl axis, nl int, q in v1,2  No st-t changes c/w ischemia   Assessment & Plan:    Active Problems:   COPD exacerbation (HCC)   Chest wall pain   Syncope   Community acquired pneumonia   Abnormal liver function  Syncope Tele Trop I Check carotid ultrasound  Check cardiac echo Check CTA chest r/o PE  R sided chest pain, likely noncardiac since reproducible with palpation.  Tele Trop I  Check CTA chest r/o rib fracture Morphine sulfate 67m iv q4h prn   Copd exacerbation Solumedrol 839miv q8h Rocephin 1gm iv qday, Zithromax 50030mv qday Incruse 1puff qday Albuterol neb q6h and q6h prn   CAP Blood culture x2 Check urine strep antigen, urine legionella antigen Abx as above  Abnormal liver function Check acute hepatitis panel Check RUQ ultrasound  ETOH abuse Librium 39m70m x1 CIWA  Tobacco dep Pt counselled on smoking cessation x 3 minutes Nicotine patch prn      DVT Prophylaxis-   Lovenox - SCDs   AM Labs Ordered, also please review Full Orders  Family Communication: Admission, patients condition and plan of care including tests being ordered  have been discussed with the patient  who indicate understanding and agree with the plan and Code Status.  Code Status:  FULL CODE  Admission status:  Inpatient: Based on patients clinical presentation and evaluation of above clinical data, I have made determination that patient meets Inpatient criteria at this time.  Pt requires iv solumedrol and iv abx for Copd exacerbation as well as w/up of syncope.  Pt has high risk of clincal deterioration without admission and will require > 2nite stay.   Time spent in minutes : 70   Jani Gravel M.D on 09/07/2019 at 6:20 AM

## 2019-09-07 NOTE — Progress Notes (Signed)
BP earlier was 208/103 and 186/98 pulse 114.  Pain uncontrolled with 1mg  morphine q4.  Contacted Dr.  Denton Brick and he gave new medication orders.

## 2019-09-07 NOTE — Progress Notes (Signed)
Patient seen and evaluated, chart reviewed, please see EMR for updated orders. Please see full H&P dictated by admitting physician Dr. Maudie Mercury for same date of service.    Patient son at bedside, questions answered  RN Izora Gala at bedside   Brief Summary- 63 y.o. male,  w ETOH abuse, Tobacco dep, Copd, not on home o2 and HTN-- admitted 09/07/19 post fall in the setting of ongoing alcohol use   Lt eye vision loss, tremors noted  A/p 1) alcohol abuse--- patient and patient's son states that patient drinks about 15 beers on a daily basis--- significantly high risk for DTs -Completed Librium as prescribed by admitting provider, continue lorazepam per CIWA protocol along with multivitamins --Patient declines info on alcohol rehab or other services  2)HTN--BP running high due to rib cage pain and some degree of DTs -Previously on antihypertensives at home, but was not on antihypertensives PTA.  -Start amlodipine 10 mg daily may use PRN labetalol for elevated BP  3) right-sided rib cage pain--- suspect mechanical fall, CTA chest and abdominal ultrasound with cholelithiasis without acute cholecystitis or other acute findings   4) COPD/tobacco abuse--- patient not interested in quitting smoking, continue bronchodilators, currently on antibiotics and steroids for presumed COPD flareup  5) status post fall--- possible syncope in the setting of heavy alcohol use--- echo and carotid artery Dopplers pending -CTA chest without acute findings

## 2019-09-07 NOTE — ED Triage Notes (Signed)
Pt c/o shortness of breathe and body pain x 2 days.

## 2019-09-08 ENCOUNTER — Inpatient Hospital Stay (HOSPITAL_COMMUNITY): Payer: Medicaid Other

## 2019-09-08 DIAGNOSIS — F10931 Alcohol use, unspecified with withdrawal delirium: Secondary | ICD-10-CM | POA: Diagnosis present

## 2019-09-08 DIAGNOSIS — F10231 Alcohol dependence with withdrawal delirium: Secondary | ICD-10-CM | POA: Diagnosis present

## 2019-09-08 DIAGNOSIS — I739 Peripheral vascular disease, unspecified: Secondary | ICD-10-CM | POA: Diagnosis present

## 2019-09-08 LAB — CBC
HCT: 53.1 % — ABNORMAL HIGH (ref 39.0–52.0)
Hemoglobin: 18.1 g/dL — ABNORMAL HIGH (ref 13.0–17.0)
MCH: 34.2 pg — ABNORMAL HIGH (ref 26.0–34.0)
MCHC: 34.1 g/dL (ref 30.0–36.0)
MCV: 100.2 fL — ABNORMAL HIGH (ref 80.0–100.0)
Platelets: 142 10*3/uL — ABNORMAL LOW (ref 150–400)
RBC: 5.3 MIL/uL (ref 4.22–5.81)
RDW: 12.5 % (ref 11.5–15.5)
WBC: 8 10*3/uL (ref 4.0–10.5)
nRBC: 0 % (ref 0.0–0.2)

## 2019-09-08 LAB — COMPREHENSIVE METABOLIC PANEL
ALT: 52 U/L — ABNORMAL HIGH (ref 0–44)
AST: 41 U/L (ref 15–41)
Albumin: 3.8 g/dL (ref 3.5–5.0)
Alkaline Phosphatase: 49 U/L (ref 38–126)
Anion gap: 10 (ref 5–15)
BUN: 13 mg/dL (ref 8–23)
CO2: 27 mmol/L (ref 22–32)
Calcium: 8.9 mg/dL (ref 8.9–10.3)
Chloride: 102 mmol/L (ref 98–111)
Creatinine, Ser: 0.69 mg/dL (ref 0.61–1.24)
GFR calc Af Amer: >60 mL/min (ref >60)
GFR calc non Af Amer: >60 mL/min (ref >60)
Glucose, Bld: 215 mg/dL — ABNORMAL HIGH (ref 70–99)
Potassium: 3.9 mmol/L (ref 3.5–5.1)
Sodium: 139 mmol/L (ref 135–145)
Total Bilirubin: 1.6 mg/dL — ABNORMAL HIGH (ref 0.3–1.2)
Total Protein: 7.6 g/dL (ref 6.5–8.1)

## 2019-09-08 LAB — ECHOCARDIOGRAM COMPLETE
Height: 70 in
Weight: 2885.38 oz

## 2019-09-08 MED ORDER — METHOCARBAMOL 500 MG PO TABS
750.0000 mg | ORAL_TABLET | Freq: Four times a day (QID) | ORAL | Status: DC
  Administered 2019-09-08 – 2019-09-09 (×3): 750 mg via ORAL
  Filled 2019-09-08 (×3): qty 2

## 2019-09-08 MED ORDER — SODIUM CHLORIDE 0.9% FLUSH: 3.0000 mL | Freq: Two times a day (BID) | INTRAVENOUS | Status: DC

## 2019-09-08 MED ORDER — POLYETHYLENE GLYCOL 3350 17 G PO PACK
17.0000 g | PACK | Freq: Every day | ORAL | Status: DC
  Administered 2019-09-09: 09:00:00 17 g via ORAL
  Filled 2019-09-08: qty 1

## 2019-09-08 MED ORDER — IOHEXOL 350 MG/ML SOLN
100.0000 mL | Freq: Once | INTRAVENOUS | Status: AC | PRN
  Administered 2019-09-08: 10:00:00 100 mL via INTRAVENOUS

## 2019-09-08 MED ORDER — DIAZEPAM 5 MG PO TABS
5.0000 mg | ORAL_TABLET | Freq: Once | ORAL | Status: AC
  Administered 2019-09-08: 19:00:00 5 mg via ORAL
  Filled 2019-09-08: qty 1

## 2019-09-08 MED ORDER — SODIUM CHLORIDE 0.9 % IV SOLN
INTRAVENOUS | Status: DC
  Administered 2019-09-08 – 2019-09-09 (×2): via INTRAVENOUS

## 2019-09-08 MED ORDER — ATORVASTATIN CALCIUM 40 MG PO TABS
40.0000 mg | ORAL_TABLET | Freq: Every day | ORAL | Status: DC
  Administered 2019-09-08: 40 mg via ORAL
  Filled 2019-09-08: qty 1

## 2019-09-08 MED ORDER — ASPIRIN 81 MG PO CHEW
81.0000 mg | CHEWABLE_TABLET | Freq: Every day | ORAL | Status: DC
  Administered 2019-09-08 – 2019-09-09 (×2): 81 mg via ORAL
  Filled 2019-09-08 (×2): qty 1

## 2019-09-08 MED ORDER — DIAZEPAM 5 MG PO TABS
5.0000 mg | ORAL_TABLET | Freq: Once | ORAL | Status: AC
  Administered 2019-09-08: 5 mg via ORAL
  Filled 2019-09-08: qty 1

## 2019-09-08 MED ORDER — ATENOLOL 25 MG PO TABS
50.0000 mg | ORAL_TABLET | Freq: Every day | ORAL | Status: DC
  Administered 2019-09-08 – 2019-09-09 (×2): 50 mg via ORAL
  Filled 2019-09-08 (×2): qty 2

## 2019-09-08 MED ORDER — IOHEXOL 300 MG/ML  SOLN: 100.0000 mL | Freq: Once | INTRAMUSCULAR | Status: DC | PRN

## 2019-09-08 MED ORDER — SENNOSIDES-DOCUSATE SODIUM 8.6-50 MG PO TABS
2.0000 | ORAL_TABLET | Freq: Every day | ORAL | Status: DC
  Administered 2019-09-08: 23:00:00 2 via ORAL
  Filled 2019-09-08 (×2): qty 2

## 2019-09-08 NOTE — TOC Initial Note (Signed)
Transition of Care [facility]) - Initial/Assessment Note    Patient Details  Name: Nathan Hart MRN: [id number] Date of Birth: 04-14-1956  [facility]) CM/SW Contact:    Boneta Lucks, RN Phone Number: 09/08/2019, 1:01 PM  Clinical Narrative:      Patient admitted for COPD, Per MD discharge planning with be to go home on with Home 02.  [facility] took the referral. TOC to follow for orders and discharge.          Expected Discharge Plan: Home/Self Care Barriers to Discharge: Continued Medical Work up   Patient Goals and CMS Choice Patient states their goals for this hospitalization and ongoing recovery are:: to go home. CMS Medicare.gov Compare Post Acute Care list provided to:: Patient Choice offered to / list presented to : Patient  Expected Discharge Plan and Services Expected Discharge Plan: Home/Self Care   Discharge Planning Services: Other - See comment(Home 02)     Expected Discharge Date: 09/09/19               DME Arranged: [employer]xygen DME Agency: [employer] Date DME Agency Contacted: 09/08/19 Time DME Agency Contacted: 74 Representative spoke with at DME Agency: Blake Divine            Prior Living Arrangements/Services   Lives with:: Adult Children   Do you feel safe going back to the place where you live?: Yes      Need for Family Participation in Patient Care: Yes (Comment)        Activities of Daily Living Home Assistive Devices/Equipment: None ADL Screening (condition at time of admission) Patient's cognitive ability adequate to safely complete daily activities?: Yes Is the patient deaf or have difficulty hearing?: No Does the patient have difficulty seeing, even when wearing glasses/contacts?: 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) Does the patient have difficulty concentrating, remembering, or making decisions?: No Patient able to express need for assistance with ADLs?: Yes Does the patient have difficulty dressing or  bathing?: No Independently performs ADLs?: Yes (appropriate for developmental age) Does the patient have difficulty walking or climbing stairs?: Yes(due to breathing problems) Weakness of Legs: None Weakness of Arms/Hands: None     Admission diagnosis:  Chest wall pain [R07.89] Hypoxia [R09.02] COPD exacerbation ([place]) [J44.1] Community acquired pneumonia, unspecified laterality [J18.9] Patient Active Problem List   Diagnosis Date Noted  . COPD exacerbation ([place]) 09/07/2019  . Chest wall pain 09/07/2019  . Syncope 09/07/2019  . Community acquired pneumonia 09/07/2019  . Abnormal liver function 09/07/2019   PCP:  Patient, No Pcp Per Pharmacy:   [facility], [address] [place] [address] Phone: [phone] Fax: [phone]        Readmission Risk Interventions No flowsheet data found.

## 2019-09-08 NOTE — Progress Notes (Addendum)
Patient Demographics:    Nathan Hart, is a 63 y.o. male, DOB - 1956/12/09, QA:[id number]  Admit date - 09/07/2019   Admitting Physician Jani Gravel, MD  Outpatient Primary MD for the patient is Patient, No Pcp Per  LOS - 1   Chief Complaint  Patient presents with   Shortness of Breath        Subjective:    Nathan Hart today has no fevers, no emesis, c/o Rib cage pain,   tremors noted,   Assessment  & Plan :    Principal Problem:   COPD exacerbation ([place]) Active Problems:   PAD (peripheral artery disease) /Bil ICA Stenosis   Delirium tremens ([place])   Chest wall pain   Syncope   Abnormal liver function/Alcoholic Hepatitis   Brief Summary- 63 y.o. male,  w/ ETOH abuse, Tobacco dep, Copd, not on home o2 and HTN-- admitted 09/07/2019 post fall in the setting of ongoing alcohol use  A/p 1)Alcohol Abuse/Delirium Tremens--- patient and patient's son states that patient drinks about 15 beers on a daily basis--- significantly high risk for DTs -Completed Librium as prescribed by admitting provider, continue lorazepam per CIWA protocol along with multivitamins --Patient declines info on alcohol rehab or other services -Significant tremors and anxiety persist, BP is elevated despite BP meds   2)HTN--BP running high due to rib cage pain and some degree of Delirium tremors -Previously on antihypertensives at home, but was not on antihypertensives PTA.  -c/n amlodipine 10 mg daily may use PRN labetalol for elevated BP  3)Right-sided rib cage pain--- suspect mechanical fall, CTA chest without rib fractures or other acute findings and abdominal ultrasound with cholelithiasis without acute cholecystitis or other acute findings  4)COPD/Tobacco Abuse--- patient not interested in quitting smoking, continue bronchodilators, currently on antibiotics and steroids for presumed COPD flareup  5)Status post  fall--- possible syncope in the setting of heavy alcohol use--- echo with EF of 60 to [id number] , with diastolic dysfunction .Carotid artery Dopplers with right and left ICA stenosis -CTA chest without acute findings  6)PAD/Carotid atherosclerosis/Stenosis-- Extensive bilateral carotid atherosclerosis. Carotid Artery Dopplers-Bilateral ICA stenoses estimated at greater than 70% by ultrasound Criteria. -CTA Neck Advanced atherosclerosis with right ICA stenosis measuring ~75% and left bifurcation stenosis measuring ~70%. 2. 60% distal left common carotid stenosis. 3. Thready flow in the proximal non-dominant left vertebral artery which is patent at the dura and basilar. Discussed with Dr Estanislado Pandy (Neuro interventional Radiologist)--- recommends outpatient diagnostic cerebral angiogram  Disposition/Need for in-Hospital Stay- patient unable to be discharged at this time due to Delirium Tremens-requiring IV lorazepam  Code Status : Full Code  Family Communication:   NA (patient is alert, awake and coherent)   Disposition Plan  : home when delirium tremens symptoms improved  Consults  :   Dr Estanislado Pandy (Neuro interventional Radiologist)  DVT Prophylaxis  :  Lovenox   SCDs   Lab Results  Component Value Date   PLT 142 (L) 09/08/2019    Inpatient Medications  Scheduled Meds:  albuterol  2.5 mg Nebulization QID   amLODipine  10 mg Oral Daily   aspirin  81 mg Oral Daily   atenolol  50 mg Oral Daily   atorvastatin  40 mg Oral q1800   chlordiazePOXIDE  50 mg Oral Once   diazepam  5 mg Oral Once   enoxaparin (LOVENOX) injection  40 mg Subcutaneous [id number]   folic acid  1 mg Oral Daily   LORazepam  0-4 mg Intravenous Q6H   Followed by   Derrill Memo ON 09/09/2019] LORazepam  0-4 mg Intravenous Q12H   methocarbamol  750 mg Oral QID   methylPREDNISolone (SOLU-MEDROL) injection  40 mg Intravenous Q12H   multivitamin with minerals  1 tablet Oral Daily   polyethylene glycol  17 g Oral  Daily   senna-docusate  2 tablet Oral QHS   thiamine  100 mg Oral Daily   Or   thiamine  100 mg Intravenous Daily   umeclidinium bromide  1 puff Inhalation Daily   Continuous Infusions:  sodium chloride     sodium chloride 100 mL/hr at 09/08/19 0909   azithromycin 500 mg (09/08/19 0452)   cefTRIAXone (ROCEPHIN)  IV 1 g (09/08/19 0455)   PRN Meds:.sodium chloride, acetaminophen **OR** acetaminophen, albuterol, labetalol, LORazepam **OR** LORazepam, morphine injection, nicotine, oxyCODONE, sodium chloride flush    Anti-infectives (From admission, onward)   Start     Dose/Rate Route Frequency Ordered Stop   09/08/19 0400  cefTRIAXone (ROCEPHIN) 1 g in sodium chloride 0.9 % 100 mL IVPB     1 g 200 mL/hr over 30 Minutes Intravenous Every 24 hours 09/07/19 0446     09/08/19 0400  azithromycin (ZITHROMAX) 500 mg in sodium chloride 0.9 % 250 mL IVPB     500 mg 250 mL/hr over 60 Minutes Intravenous Every 24 hours 09/07/19 0446     09/07/19 0345  cefTRIAXone (ROCEPHIN) 1 g in sodium chloride 0.9 % 100 mL IVPB     1 g 200 mL/hr over 30 Minutes Intravenous  Once 09/07/19 0332 09/07/19 0416   09/07/19 0345  azithromycin (ZITHROMAX) 500 mg in sodium chloride 0.9 % 250 mL IVPB     500 mg 250 mL/hr over 60 Minutes Intravenous  Once 09/07/19 0332 09/07/19 0451        Objective:   Vitals:   09/08/19 1233 09/08/19 1500 09/08/19 1700 09/08/19 1800  BP:   (!) 159/83 (!) 148/83  Pulse:   96 89  Resp:    20  Temp:    97.6 F (36.4 C)  TempSrc:    Oral  SpO2: 90% 93%  97%  Weight:      Height:        Wt Readings from Last 3 Encounters:  09/08/19 82.3 kg     Intake/Output Summary (Last 24 hours) at 09/08/2019 1836 Last data filed at 09/08/2019 1700 Gross per 24 hour  Intake 240 ml  Output --  Net 240 ml   Physical Exam Gen:- Awake Alert, anxious HEENT:- [email or web address].AT, No sclera icterus, Eye---Lt eye vision loss,  Neck-Supple Neck,No JVD,.  Lungs-  CTAB , fair symmetrical air  movement CV- S1, S2 normal, regular  Abd-  +ve B.Sounds, Abd Soft, No tenderness,    Extremity/Skin:- No  edema, pedal pulses present  Psych-affect is anxious,  oriented x3 Neuro-no new focal deficits, +ve Tremors   Data Review:   Micro Results Recent Results (from the past 240 hour(s))  SARS Coronavirus 2 [facility] order, Performed in [facility] hospital lab) Nasopharyngeal Nasopharyngeal Swab     Status: None   Collection Time: 09/07/19  1:29 AM   Specimen: Nasopharyngeal Swab  Result Value Ref Range Status   SARS Coronavirus 2 NEGATIVE NEGATIVE Final    Comment: (NOTE)  If result is NEGATIVE SARS-CoV-2 target nucleic acids are NOT DETECTED. The SARS-CoV-2 RNA is generally detectable in upper and lower  respiratory specimens during the acute phase of infection. The lowest  concentration of SARS-CoV-2 viral copies this assay can detect is 250  copies / mL. A negative result does not preclude SARS-CoV-2 infection  and should not be used as the sole basis for treatment or other  patient management decisions.  A negative result may occur with  improper specimen collection / handling, submission of specimen other  than nasopharyngeal swab, presence of viral mutation(s) within the  areas targeted by this assay, and inadequate number of viral copies  (<250 copies / mL). A negative result must be combined with clinical  observations, patient history, and epidemiological information. If result is POSITIVE SARS-CoV-2 target nucleic acids are DETECTED. The SARS-CoV-2 RNA is generally detectable in upper and lower  respiratory specimens dur ing the acute phase of infection.  Positive  results are indicative of active infection with SARS-CoV-2.  Clinical  correlation with patient history and other diagnostic information is  necessary to determine patient infection status.  Positive results do  not rule out bacterial infection or co-infection with other viruses. If result is PRESUMPTIVE  POSTIVE SARS-CoV-2 nucleic acids MAY BE PRESENT.   A presumptive positive result was obtained on the submitted specimen  and confirmed on repeat testing.  While 2019 novel coronavirus  (SARS-CoV-2) nucleic acids may be present in the submitted sample  additional confirmatory testing may be necessary for epidemiological  and / or clinical management purposes  to differentiate between  SARS-CoV-2 and other Sarbecovirus currently known to infect humans.  If clinically indicated additional testing with an alternate test  methodology [phone]) is advised. The SARS-CoV-2 RNA is generally  detectable in upper and lower respiratory sp ecimens during the acute  phase of infection. The expected result is Negative. Fact Sheet for Patients:  StrictlyIdeas.no Fact Sheet for Healthcare Providers: BankingDealers.co.za This test is not yet approved or cleared by the [place] FDA and has been authorized for detection and/or diagnosis of SARS-CoV-2 by FDA under an Emergency Use Authorization (EUA).  This EUA will remain in effect (meaning this test can be used) for the duration of the COVID-19 declaration under Section 564(b)(1) of the Act, 21 U.S.C. section 360bbb-3(b)(1), unless the authorization is terminated or revoked sooner. Performed at [facility], [address] [place], [place] [address]   Culture, blood (routine x 2)     Status: None (Preliminary result)   Collection Time: 09/07/19  7:55 AM   Specimen: Right Antecubital; Blood  Result Value Ref Range Status   Specimen Description   Final    RIGHT ANTECUBITAL BOTTLES DRAWN AEROBIC AND ANAEROBIC   Special Requests Blood Culture adequate volume  Final   Culture   Final    NO GROWTH 1 DAY Performed at [facility], [address], [place], [place] [address]    Report Status PENDING  Incomplete  Culture, blood (routine x 2)     Status: None (Preliminary result)   Collection Time: 09/07/19   7:56 AM   Specimen: BLOOD RIGHT ARM  Result Value Ref Range Status   Specimen Description BLOOD RIGHT ARM BOTTLES DRAWN AEROBIC ONLY  Final   Special Requests Blood Culture adequate volume  Final   Culture   Final    NO GROWTH 1 DAY Performed at [facility], [address] [place], [place] [address]    Report Status PENDING  Incomplete    Radiology Reports Ct Head Wo Contrast  Result Date: 09/07/2019 CLINICAL DATA:  Found on the floor last night. EXAM: CT HEAD WITHOUT CONTRAST TECHNIQUE: Contiguous axial images were obtained from the base of the skull through the vertex without intravenous contrast. COMPARISON:  None. FINDINGS: Brain: No focal abnormality is seen affecting the brainstem or cerebellum. Right cerebral hemisphere shows an old right frontoparietal cortical and subcortical infarction consistent with MCA branch vessel territory infarction, progressed to atrophy, encephalomalacia and gliosis. There is low-density in the left basal ganglia which probably represents an old stroke, though the possibility of subacute infarction in that region is not completely excluded. No evidence of mass effect or hemorrhage. No hydrocephalus or extra-axial collection. Vascular: There is atherosclerotic calcification of the major vessels at the base of the brain. Skull: Negative Sinuses/Orbits: Chronic opacification of the right maxillary sinus. Other sinuses clear. Other: None IMPRESSION: No definite acute finding by CT. 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 Probably old left basal ganglia infarction. There is some potential there could be subacute infarction in the left basal ganglia. No hemorrhage or mass effect. Chronic inflammatory changes of the right maxillary sinus. Electronically Signed   By: Nelson Chimes M.D.   On: 09/07/2019 09:39   Ct Angio Neck W Or Wo Contrast  Result Date: 09/08/2019 CLINICAL DATA:  Syncope.  Bilateral ICA stenosis by ultrasound EXAM: CT ANGIOGRAPHY NECK  TECHNIQUE: Multidetector CT imaging of the neck was performed using the standard protocol during bolus administration of intravenous contrast. Multiplanar CT image reconstructions and MIPs were obtained to evaluate the vascular anatomy. Carotid stenosis measurements (when applicable) are obtained utilizing NASCET criteria, using the distal internal carotid diameter as the denominator. CONTRAST:  161mL OMNIPAQUE IOHEXOL 350 MG/ML SOLN COMPARISON:  None. FINDINGS: Aortic arch: Atherosclerotic plaque.  Three vessel branching. Right carotid system: Diffuse atheromatous wall thickening of the common carotid artery with particularly bulky plaque about the bifurcation. Proximal ICA stenosis measures 75%. Left carotid system: Extensive atherosclerotic plaque on the common carotid with 60% stenosis in the distal ICA. Bulky plaque at the bifurcation causes 70% stenosis when compared to the downstream ICA. No downstream beading. Heavily calcified ICA at the skull base. Vertebral arteries: Proximal atherosclerosis along the subclavians. No flow limiting stenosis in the subclavians. Strong right vertebral artery dominance without superimposed flow limiting stenosis. Thready/intermittent flow in the proximal left vertebral artery with improved flow by the dura. Heavily calcified proximal left V4 segment atherosclerotic plaque with at least moderate stenosis. The distal left V4 segment and left PICA are patent. Skeleton: Advanced cervical disc degeneration with osteitis and reversal of cervical lordosis. Other neck: Right maxillary sinusitis with complete opacification. Upper chest: Centrilobular emphysema. IMPRESSION: 1. Advanced atherosclerosis with right ICA stenosis measuring ~75% and left bifurcation stenosis measuring ~70%. 2. 60% distal left common carotid stenosis. 3. Thready flow in the proximal non-dominant left vertebral artery which is patent at the dura and basilar. Electronically Signed   By: Monte Fantasia M.D.    On: 09/08/2019 10:31   Ct Angio Chest Pe W Or Wo Contrast  Result Date: 09/07/2019 CLINICAL DATA:  Syncope. Found on floor at home last night. Right-sided chest pain. Possible right rib fracture. EXAM: CT ANGIOGRAPHY CHEST WITH CONTRAST TECHNIQUE: Multidetector CT imaging of the chest was performed using the standard protocol during bolus administration of intravenous contrast. Multiplanar CT image reconstructions and MIPs were obtained to evaluate the vascular anatomy. CONTRAST:  164mL OMNIPAQUE IOHEXOL 350 MG/ML SOLN COMPARISON:  None. FINDINGS: Cardiovascular: Heart is normal size. Mild calcified plaque over the left main, left anterior descending and lateral circumflex coronary arteries. Mild calcified plaque over the thoracic aorta. Thoracic aorta is otherwise normal in caliber. Pulmonary arterial system is well opacified and demonstrates no evidence of emboli. Remaining vascular structures are unremarkable. Mediastinum/Nodes: No mediastinal or hilar adenopathy. Remaining mediastinal structures are unremarkable. Lungs/Pleura: Lungs are adequately inflated with mild centrilobular emphysematous disease. Minimal lingular scarring. Tiny peripheral calcified granuloma over the left apex. Minimal opacification over the left lower lobe likely atelectasis and less likely infection. Small amount of aspirate material over the dependent portion of the distal trachea. Remaining airways are unremarkable. Upper Abdomen: Minimal sludge versus cholelithiasis as the gallbladder is only partially visualized. Calcified plaque over the abdominal aorta. 3.7 cm partially visualized right adrenal adenoma. No acute findings. Musculoskeletal: Degenerative change of the spine. Review of the MIP images confirms the above findings. IMPRESSION: No evidence of pulmonary embolism. Mild right basilar opacification likely atelectasis and less likely infection. Minimal aspirate material over the dependent portion of the distal trachea.  Minimal lingular scarring. Aortic Atherosclerosis (ICD10-I70.0) and Emphysema (ICD10-J43.9). Minimal atherosclerotic coronary artery disease. Mild cholelithiasis/sludge. 3.7 cm right adrenal adenoma partially visualized. Electronically Signed   By: Marin Olp M.D.   On: 09/07/2019 09:50   US Carotid Bilateral  Result Date: 09/07/2019 CLINICAL DATA:  Hypertension, syncope, hyperlipidemia EXAM: BILATERAL CAROTID DUPLEX ULTRASOUND TECHNIQUE: Pearline Cables scale imaging, color Doppler and duplex ultrasound were performed of bilateral carotid and vertebral arteries in the neck. COMPARISON:  None. FINDINGS: Criteria: Quantification of carotid stenosis is based on velocity parameters that correlate the residual internal carotid diameter with NASCET-based stenosis levels, using the diameter of the distal internal carotid lumen as the denominator for stenosis measurement. The following velocity measurements were obtained: RIGHT ICA: 425/69 cm/sec CCA: 0000000 cm/sec SYSTOLIC ICA/CCA RATIO:  3.6 ECA: 445 cm/sec LEFT ICA: 351/46 cm/sec CCA: [id number] cm/sec SYSTOLIC ICA/CCA RATIO:  2.6 ECA: 328 cm/sec RIGHT CAROTID ARTERY: Extensive carotid bifurcation atherosclerosis. Marked velocity elevation in the proximal right ICA measuring 425/69 centimeters/second. Exam is difficult because of the degree of calcification. Right ICA stenosis estimated at greater than 70% by ultrasound criteria. RIGHT VERTEBRAL ARTERY:  Antegrade LEFT CAROTID ARTERY: Extensive left carotid bifurcation atherosclerosis. Degree of calcification limits the exam. Proximal left ICA velocity elevation measures 351/46 centimeters/second with turbulent flow noted. Left ICA stenosis also estimated greater than 70%. LEFT VERTEBRAL ARTERY:  Not visualized, likely occluded IMPRESSION: Extensive bilateral carotid atherosclerosis. Bilateral ICA stenoses estimated at greater than 70% by ultrasound criteria. Patent antegrade right vertebral artery. Suspect occluded left  vertebral artery. These results will be called to the ordering clinician or representative by the Radiologist Assistant, and communication documented in the PACS or zVision Dashboard. Electronically Signed   By: Jerilynn Mages.  Shick M.D.   On: 09/07/2019 14:55   Dg Chest Port 1 View  Result Date: 09/07/2019 CLINICAL DATA:  Wheezing and shortness of breath. History of COPD. EXAM: PORTABLE CHEST 1 VIEW COMPARISON:  None. FINDINGS: Heart is normal in size. Aortic atherosclerosis. Mild bilateral hilar prominence. There is central peribronchial thickening. Patchy bibasilar opacities. No pleural effusion, right costophrenic angle not entirely included in the field of view. No pneumothorax. IMPRESSION: 1. Central bronchial thickening. Patchy bibasilar opacities, atelectasis versus pneumonia. 2. Mild bilateral hilar prominence is likely related to overlapping vascular structures, but nonspecific. Aortic Atherosclerosis (ICD10-I70.0). Electronically Signed   By: Keith Rake M.D.   On: 09/07/2019 02:08  US Abdomen Limited Ruq  Result Date: 09/07/2019 CLINICAL DATA:  Right upper quadrant pain and mid abdominal pain. EXAM: ULTRASOUND ABDOMEN LIMITED RIGHT UPPER QUADRANT COMPARISON:  Chest CT today. FINDINGS: Gallbladder: Mild to moderate cholelithiasis with borderline wall thickening of 3.3 mm. Largest stone measures 1 cm. Negative sonographic Murphy sign. No adjacent pericholecystic fluid. Common bile duct: Diameter: 3.6 mm. Liver: Moderate coarse increased parenchymal echogenicity without focal mass compatible with steatosis. Portal vein is patent on color Doppler imaging with normal direction of blood flow towards the liver. Other: No free fluid. IMPRESSION: Mild to moderate cholelithiasis with borderline wall thickening. No definitive findings for acute cholecystitis. Hepatic steatosis without focal mass. Electronically Signed   By: Marin Olp M.D.   On: 09/07/2019 10:51     CBC Recent Labs  Lab 09/07/19 0140  09/07/19 0747 09/08/19 0429  WBC 6.9 5.5 8.0  HGB 18.5* 18.4* 18.1*  HCT 55.1* 53.8* 53.1*  PLT 191 152 142*  MCV 99.6 99.8 100.2*  MCH 33.5 34.1* 34.2*  MCHC 33.6 34.2 34.1  RDW 12.7 12.6 12.5  LYMPHSABS 2.2  --   --   MONOABS 0.4  --   --   EOSABS 0.1  --   --   BASOSABS 0.1  --   --     Chemistries  Recent Labs  Lab 09/07/19 0140 09/07/19 0747 09/08/19 0429  NA 139 140 139  K 4.1 3.9 3.9  CL 103 106 102  CO2 23 20* 27  GLUCOSE 141* 173* 215*  BUN 9 9 13   CREATININE 0.72 0.60* 0.69  CALCIUM 8.4* 8.4* 8.9  MG  --  1.9  --   AST 56* 52* 41  ALT 64* 63* 52*  ALKPHOS 62 49 49  BILITOT 1.0 1.1 1.6*   ------------------------------------------------------------------------------------------------------------------ No results for input(s): CHOL, HDL, LDLCALC, TRIG, CHOLHDL, LDLDIRECT in the last 72 hours.  No results found for: HGBA1C ------------------------------------------------------------------------------------------------------------------ No results for input(s): TSH, T4TOTAL, T3FREE, THYROIDAB in the last 72 hours.  Invalid input(s): FREET3 ------------------------------------------------------------------------------------------------------------------ No results for input(s): VITAMINB12, FOLATE, FERRITIN, TIBC, IRON, RETICCTPCT in the last 72 hours.  Coagulation profile No results for input(s): INR, PROTIME in the last 168 hours.  No results for input(s): DDIMER in the last 72 hours.  Cardiac Enzymes Recent Labs  Lab 09/07/19 0747  CKMB 4.7   ------------------------------------------------------------------------------------------------------------------ No results found for: BNP   Roxan Hockey M.D on 09/08/2019 at 6:36 PM  Go to [email or web address] - for contact info  [facility] - Office  [phone]

## 2019-09-08 NOTE — Progress Notes (Signed)
DME Choices: [facility] [address] [place], [place]  [phone]  [facility] [address] [place], [place] [address] [phone]   [facility] [address] , [place] [address] [phone] 

## 2019-09-09 ENCOUNTER — Encounter (HOSPITAL_COMMUNITY): Payer: Self-pay

## 2019-09-09 LAB — LEGIONELLA PNEUMOPHILA SEROGP 1 UR AG: L. pneumophila Serogp 1 Ur Ag: NEGATIVE

## 2019-09-09 LAB — LIPID PANEL
Cholesterol: 167 mg/dL (ref 0–200)
HDL: 70 mg/dL (ref >40)
LDL Cholesterol: 81 mg/dL (ref 0–99)
Total CHOL/HDL Ratio: 2.4 RATIO
Triglycerides: 79 mg/dL (ref <150)
VLDL: 16 mg/dL (ref 0–40)

## 2019-09-09 LAB — HEPATITIS PANEL, ACUTE
HCV Ab: 0.4 s/co ratio (ref 0.0–0.9)
Hep A IgM: NEGATIVE
Hep B C IgM: NEGATIVE
Hepatitis B Surface Ag: NEGATIVE

## 2019-09-09 LAB — HIV ANTIBODY (ROUTINE TESTING W REFLEX): HIV Screen 4th Generation wRfx: NONREACTIVE

## 2019-09-09 MED ORDER — AMLODIPINE BESYLATE 10 MG PO TABS: 10.0000 mg | ORAL_TABLET | Freq: Every day | ORAL | 2 refills | Status: DC

## 2019-09-09 MED ORDER — OXYCODONE HCL 5 MG PO TABS: 5.0000 mg | ORAL_TABLET | Freq: Four times a day (QID) | ORAL | 0 refills | Status: AC | PRN

## 2019-09-09 MED ORDER — POLYETHYLENE GLYCOL 3350 17 G PO PACK: 17.0000 g | PACK | Freq: Every day | ORAL | 0 refills | Status: DC

## 2019-09-09 MED ORDER — METHOCARBAMOL 750 MG PO TABS: 750.0000 mg | ORAL_TABLET | Freq: Three times a day (TID) | ORAL | 0 refills | Status: DC

## 2019-09-09 MED ORDER — NICOTINE 21 MG/24HR TD PT24: 21.0000 mg | MEDICATED_PATCH | Freq: Every day | TRANSDERMAL | 0 refills | Status: DC | PRN

## 2019-09-09 MED ORDER — ATORVASTATIN CALCIUM 40 MG PO TABS: 40.0000 mg | ORAL_TABLET | Freq: Every evening | ORAL | 5 refills | Status: DC

## 2019-09-09 MED ORDER — ASPIRIN EC 81 MG PO TBEC: 81.0000 mg | DELAYED_RELEASE_TABLET | Freq: Every day | ORAL | 2 refills | Status: DC

## 2019-09-09 MED ORDER — DOXYCYCLINE HYCLATE 100 MG PO TABS: 100.0000 mg | ORAL_TABLET | Freq: Two times a day (BID) | ORAL | 0 refills | Status: AC

## 2019-09-09 MED ORDER — GUAIFENESIN ER 600 MG PO TB12: 600.0000 mg | ORAL_TABLET | Freq: Two times a day (BID) | ORAL | 0 refills | Status: AC

## 2019-09-09 MED ORDER — UMECLIDINIUM BROMIDE 62.5 MCG/INH IN AEPB: 1.0000 | INHALATION_SPRAY | Freq: Every day | RESPIRATORY_TRACT | 5 refills | Status: DC

## 2019-09-09 MED ORDER — FOLIC ACID 1 MG PO TABS: 1.0000 mg | ORAL_TABLET | Freq: Every day | ORAL | 5 refills | Status: DC

## 2019-09-09 MED ORDER — PREDNISONE 20 MG PO TABS: 40.0000 mg | ORAL_TABLET | Freq: Every day | ORAL | 0 refills | Status: DC

## 2019-09-09 MED ORDER — NICOTINE 21 MG/24HR TD PT24: 21.0000 mg | MEDICATED_PATCH | Freq: Every day | TRANSDERMAL | 0 refills | Status: DC

## 2019-09-09 MED ORDER — ALBUTEROL SULFATE (2.5 MG/3ML) 0.083% IN NEBU: 2.5000 mg | INHALATION_SOLUTION | Freq: Four times a day (QID) | RESPIRATORY_TRACT | 12 refills | Status: DC | PRN

## 2019-09-09 MED ORDER — ATENOLOL 50 MG PO TABS: 50.0000 mg | ORAL_TABLET | Freq: Every day | ORAL | 2 refills | Status: DC

## 2019-09-09 MED ORDER — DOXYCYCLINE HYCLATE 100 MG PO TABS: 100.0000 mg | ORAL_TABLET | Freq: Two times a day (BID) | ORAL | 0 refills | Status: DC

## 2019-09-09 MED ORDER — DIAZEPAM 5 MG PO TABS
5.0000 mg | ORAL_TABLET | Freq: Once | ORAL | Status: AC
  Administered 2019-09-09: 5 mg via ORAL
  Filled 2019-09-09: qty 1

## 2019-09-09 MED ORDER — AZITHROMYCIN 250 MG PO TABS: 500.0000 mg | ORAL_TABLET | Freq: Every day | ORAL | Status: DC

## 2019-09-09 MED ORDER — THIAMINE HCL 100 MG PO TABS: 100.0000 mg | ORAL_TABLET | Freq: Every day | ORAL | 5 refills | Status: DC

## 2019-09-09 NOTE — Discharge Summary (Signed)
Nathan Hart, is a 63 y.o. male  DOB July 20, 1956  MRN [id number].  Admission date:  09/07/2019  Admitting Physician  Jani Gravel, MD  Discharge Date:  09/09/2019   Primary MD  Patient, No Pcp Per  Recommendations for primary care physician for things to follow:   - 1)You are taking prednisone and aspirin so avoid ibuprofen/Advil/Aleve/Motrin/Goody Powders/Naproxen/BC powders/Meloxicam/Diclofenac/Indomethacin and other Nonsteroidal anti-inflammatory medications as these will make you more likely to bleed and can cause stomach ulcers, can also cause Kidney problems.  2)Use oxygen continuously at 2 L/min via nasal cannula as prescribed--- absolutely no open fires and No  smoking around oxygen due to risk of fire, and Death 3)Complete smoking cessation advised--- please use nicotine patch to help you quit smoking 4)Abstinence from alcohol strongly encouraged--consider alcohol rehab programs and AA meetings.  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 Delray Alt diagnostic Cerebral arteriogram--on Friday 09/12/2019 at 1000 am at [facility] have to be fasting for this test (Nothing by Mouth except for Medications)--- talk to the main entrance at [facility] ([address]-  -You will need a driver as you will be unable to drive after this procedure  Admission Diagnosis  Chest wall pain [R07.89] Hypoxia [R09.02] COPD exacerbation ([place]) [J44.1] Community acquired pneumonia, unspecified laterality [J18.9]  Discharge Diagnosis  Chest wall pain [R07.89] Hypoxia [R09.02] COPD exacerbation ([place]) [J44.1] Community acquired pneumonia, unspecified laterality [J18.9]   Principal Problem:   COPD exacerbation ([place]) Active Problems:   PAD (peripheral artery disease) /Bil ICA Stenosis   Delirium tremens ([place])   Chest wall pain  Syncope   Abnormal liver function/Alcoholic Hepatitis      Past Medical History:  Diagnosis Date   Abnormal peripheral vision of left eye    Blind right eye    COPD (chronic obstructive pulmonary disease) ([place])    not on home o2   Empyema lung ([place])    ETOH abuse    HTN (hypertension)     Past Surgical History:  Procedure Laterality Date   [place]       HPI  from the history and physical done on the day of admission:    - Nathan Hart  is a 63 y.o. male,  w ETOH abuse, Tobacco dep, Copd, not on home o2, apparently was found on the floor and does not recall how he got there.  Pt notes right sided chest pain worse with cough, slight dyspnea.   Pt thinks that he might have cracked a rib. Possibly fell against something.  Pt denies fever, chills, productive cough, palp, n/v, abd pain, diarrhea, brbpr, black stool, dysuria, hematuria.  Drinks about 20 beers this past week , maybe more.   In ED,  T 98.4, P 97 R 32 Bp 177/98 pox 89% on RA  Wt  79.4 kg   CXR IMPRESSION: 1. Central bronchial thickening. Patchy bibasilar opacities, atelectasis versus pneumonia. 2. Mild bilateral hilar prominence is likely related to overlapping vascular structures, but nonspecific.  coivd-19 negative Na 139, K 4.1, Bun 9, Creatinine 0.72 Ast 56, Alt 64, Alk phos 62, T. bili 1.0 Wbc 6.9, Hgb 18.5, Plt 191  PH 7.348, Co2 36.6, Po2 59.9  Pt will be admitted for Copd exacerbation, ? CAP and abnormal liver function and ? Syncope vs inebriation.     Hospital Course:    Brief Summary- 63 y.o.male,w/ ETOH abuse, Tobacco dep, Copd, not on home o2 and HTN-- admitted 09/07/2019 post fall in the setting of ongoing alcohol use  A/p 1)Alcohol Abuse/Delirium Tremens--- patient and patient's son states that patient drinks about 15 beers on a daily basis--- patient had delirium tremens during his  hospital stay, treated with benzos -delirium tremens appears to be resolving at this time -Continue multivitamins --Patient declines info on alcohol rehab or other services -  2)HTN--okay to discharge home on amlodipine and atenolol  3)Right-sided rib cage pain--- suspect mechanical fall, CTA chest without rib fractures or other acute findings and abdominal ultrasound with cholelithiasis without acute cholecystitis or other acute findings -Methocarbamol and as needed oxycodone as prescribed  4) acute COPD exacerbation /Tobacco Abuse--- patient not interested in quitting smoking, continue bronchodilators, -Treated with Solu-Medrol and antibiotics -Discharge home on bronchodilators, Mucinex doxycycline and prednisone  5)Status post fall--- possible syncope in the setting of heavy alcohol use--- echo with EF of 60 to 48% , with diastolic dysfunction .Carotid artery Dopplers with right and left ICA stenosis -CTA chest without acute findings  6)PAD/Carotid atherosclerosis/Stenosis-- Extensive bilateral carotid atherosclerosis. Carotid Artery Dopplers-Bilateral ICA stenoses estimated at greater than 70% by ultrasound Criteria. -CTA Neck Advanced atherosclerosis with right ICA stenosis measuring ~75% and left bifurcation stenosis measuring ~70%. 2. 60% distal left common carotid stenosis. 3. Thready flow in the proximal non-dominant left vertebral artery which is patent at the dura and basilar. Discussed with Dr Estanislado Pandy (Neuro interventional Radiologist)--- recommends outpatient diagnostic cerebral angiogram -HDL is 70 with an LDL of 81 and triglycerides of 79 in the setting of heavy alcohol use -Given severe PAD Even if his lipid panel is within desired limits, patient should still take Lipitor for it's Pleiotropic effects (beyond cholesterol lowering benefits) -follow with Dr. Delray Alt diagnostic Cerebral arteriogram--on Friday 09/12/2019 at 1000 am at [facility]-  7)Hypoxic Resp Failure--- suspect patient has acute hypoxic respiratory failure that was previously undiagnosed, patient has underlying COPD, and smokes 2 packs of cigarettes a day --Discharge on home O2 at 2 L via nasal cannula please see discharge instructions -Risk of oxygen and  open fire/smoking emphasized to patient who verbalizes understanding  Code Status : Full Code  Family Communication:   (patient is alert, awake and coherent) Discussed with son    Consults  :   Dr Estanislado Pandy (Neuro interventional Radiologist)--- -follow with Dr. Delray Alt diagnostic Cerebral arteriogram--on Friday 09/12/2019 at 1000 am at [facility]-   Discharge Condition: stable  Follow UP-follow with Dr. Delray Alt diagnostic Cerebral arteriogram--on Friday 09/12/2019 at 1000 am at [facility].   Why:  For Home Oxygen         Diet and Activity recommendation:  As advised  Discharge Instructions    Discharge Instructions    Call MD for:  difficulty breathing, headache or visual disturbances   Complete by: As  directed    Call MD for:  persistant dizziness or light-headedness   Complete by: As directed    Call MD for:  persistant nausea and vomiting   Complete by: As directed    Call MD for:  severe uncontrolled pain   Complete by: As directed    Call MD for:  temperature >100.4   Complete by: As directed    Diet - low sodium heart healthy   Complete by: As directed    Discharge instructions   Complete by: As directed    1) you are taking prednisone and aspirin so avoid ibuprofen/Advil/Aleve/Motrin/Goody Powders/Naproxen/BC powders/Meloxicam/Diclofenac/Indomethacin and other Nonsteroidal anti-inflammatory medications as these will make you more likely to bleed and can cause stomach ulcers, can also cause Kidney problems.  2) use oxygen continuously at 2 L/min via nasal cannula as prescribed---  absolutely no open fires and No  smoking around oxygen due to risk of fire, and Death 3) complete smoking cessation advised--- please use nicotine patch to help you quit smoking 4) abstinence from alcohol strongly encouraged--consider alcohol rehab programs and AA meetings.   Increase activity slowly   Complete by: As directed         Discharge Medications     Allergies as of 09/09/2019   No Known Allergies     Medication List    TAKE these medications   albuterol (2.5 MG/3ML) 0.083% nebulizer solution Commonly known as: PROVENTIL Take 3 mLs (2.5 mg total) by nebulization every 6 (six) hours as needed for wheezing or shortness of breath. For breathing   amLODipine 10 MG tablet Commonly known as: NORVASC Take 1 tablet (10 mg total) by mouth daily. For BP Start taking on: September 10, 2019   aspirin EC 81 MG tablet Take 1 tablet (81 mg total) by mouth daily with breakfast.   atenolol 50 MG tablet Commonly known as: TENORMIN Take 1 tablet (50 mg total) by mouth daily. For BP Start taking on: September 10, 2019   atorvastatin 40 MG tablet Commonly known as: LIPITOR Take 1 tablet (40 mg total) by mouth every evening. For Cholesterol   doxycycline 100 MG tablet Commonly known as: VIBRA-TABS Take 1 tablet (100 mg total) by mouth 2 (two) times daily for 5 days.   folic acid 1 MG tablet Commonly known as: FOLVITE Take 1 tablet (1 mg total) by mouth daily. Start taking on: September 10, 2019   guaiFENesin 600 MG 12 hr tablet Commonly known as: Mucinex Take 1 tablet (600 mg total) by mouth 2 (two) times daily for 10 days.   methocarbamol 750 MG tablet Commonly known as: ROBAXIN Take 1 tablet (750 mg total) by mouth 3 (three) times daily. For muscle pain   nicotine 21 mg/24hr patch Commonly known as: NICODERM CQ - dosed in mg/24 hours Place 1 patch (21 mg total) onto the skin daily.   oxyCODONE 5 MG immediate release tablet Commonly known as: Oxy IR/ROXICODONE Take  1 tablet (5 mg total) by mouth every 6 (six) hours as needed for up to 5 days for severe pain.   polyethylene glycol 17 g packet Commonly known as: MIRALAX / GLYCOLAX Take 17 g by mouth daily. For bowel Start taking on: September 10, 2019   predniSONE 20 MG tablet Commonly known as: Deltasone Take 2 tablets (40 mg total) by mouth daily with breakfast. For lungs   thiamine 100 MG tablet Take 1 tablet (100 mg total) by mouth daily. Start taking on: September 10, 2019  umeclidinium bromide 62.5 MCG/INH Aepb Commonly known as: INCRUSE ELLIPTA Inhale 1 puff into the lungs daily. For breathing Start taking on: September 10, 2019            Durable Medical Equipment  (From admission, onward)         Start     Ordered   09/09/19 1538  For home use only DME Nebulizer/meds  Once    Question Answer Comment  Patient needs a nebulizer to treat with the following condition Dyspnea   Length of Need Lifetime      09/09/19 1537   09/09/19 1537  For home use only DME Nebulizer machine  Once    Question Answer Comment  Patient needs a nebulizer to treat with the following condition COPD (chronic obstructive pulmonary disease) ([place])   Length of Need Lifetime      09/09/19 1537   09/09/19 1251  For home use only DME oxygen  Once    Comments: SATURATION QUALIFICATIONS: (Thisnote is usedto comply with regulatory documentation for home oxygen)  Patient Saturations on Room Air at Rest =90%  Patient Saturations on [employer] while Ambulating =87 %  Patient Saturations on2Liters of oxygen while Ambulating = 96 %   Patient needs continuous O2 at 2 L/min continuously via nasal cannula with humidifier, with gaseous portability and conserving device  Diagnosis--- COPD  Question Answer Comment  Length of Need Lifetime   Mode or (Route) Nasal cannula   Liters per Minute 2   Frequency Continuous (stationary and portable oxygen unit needed)   Oxygen conserving device Yes   Oxygen  delivery system Gas      09/09/19 1252          Major procedures and Radiology Reports - PLEASE review detailed and final reports for all details, in brief -   Ct Head Wo Contrast  Result Date: 09/07/2019 CLINICAL DATA:  Found on the floor last night. EXAM: CT HEAD WITHOUT CONTRAST TECHNIQUE: Contiguous axial images were obtained from the base of the skull through the vertex without intravenous contrast. COMPARISON:  None. FINDINGS: Brain: No focal abnormality is seen affecting the brainstem or cerebellum. Right cerebral hemisphere shows an old right frontoparietal cortical and subcortical infarction consistent with MCA branch vessel territory infarction, progressed to atrophy, encephalomalacia and gliosis. There is low-density in the left basal ganglia which probably represents an old stroke, though the possibility of subacute infarction in that region is not completely excluded. No evidence of mass effect or hemorrhage. No hydrocephalus or extra-axial collection. Vascular: There is atherosclerotic calcification of the major vessels at the base of the brain. Skull: Negative Sinuses/Orbits: Chronic opacification of the right maxillary sinus. Other sinuses clear. Other: None IMPRESSION: No definite acute finding by CT. 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 Probably old left basal ganglia infarction. There is some potential there could be subacute infarction in the left basal ganglia. No hemorrhage or mass effect. Chronic inflammatory changes of the right maxillary sinus. Electronically Signed   By: Nelson Chimes M.D.   On: 09/07/2019 09:39   Ct Angio Neck W Or Wo Contrast  Result Date: 09/08/2019 CLINICAL DATA:  Syncope.  Bilateral ICA stenosis by ultrasound EXAM: CT ANGIOGRAPHY NECK TECHNIQUE: Multidetector CT imaging of the neck was performed using the standard protocol during bolus administration of intravenous contrast. Multiplanar CT image reconstructions and MIPs were obtained to  evaluate the vascular anatomy. Carotid stenosis measurements (when applicable) are obtained utilizing NASCET criteria, using the distal internal carotid  diameter as the denominator. CONTRAST:  171m OMNIPAQUE IOHEXOL 350 MG/ML SOLN COMPARISON:  None. FINDINGS: Aortic arch: Atherosclerotic plaque.  Three vessel branching. Right carotid system: Diffuse atheromatous wall thickening of the common carotid artery with particularly bulky plaque about the bifurcation. Proximal ICA stenosis measures 75%. Left carotid system: Extensive atherosclerotic plaque on the common carotid with 60% stenosis in the distal ICA. Bulky plaque at the bifurcation causes 70% stenosis when compared to the downstream ICA. No downstream beading. Heavily calcified ICA at the skull base. Vertebral arteries: Proximal atherosclerosis along the subclavians. No flow limiting stenosis in the subclavians. Strong right vertebral artery dominance without superimposed flow limiting stenosis. Thready/intermittent flow in the proximal left vertebral artery with improved flow by the dura. Heavily calcified proximal left V4 segment atherosclerotic plaque with at least moderate stenosis. The distal left V4 segment and left PICA are patent. Skeleton: Advanced cervical disc degeneration with osteitis and reversal of cervical lordosis. Other neck: Right maxillary sinusitis with complete opacification. Upper chest: Centrilobular emphysema. IMPRESSION: 1. Advanced atherosclerosis with right ICA stenosis measuring ~75% and left bifurcation stenosis measuring ~70%. 2. 60% distal left common carotid stenosis. 3. Thready flow in the proximal non-dominant left vertebral artery which is patent at the dura and basilar. Electronically Signed   By: JMonte FantasiaM.D.   On: 09/08/2019 10:31   Ct Angio Chest Pe W Or Wo Contrast  Result Date: 09/07/2019 CLINICAL DATA:  Syncope. Found on floor at home last night. Right-sided chest pain. Possible right rib fracture. EXAM:  CT ANGIOGRAPHY CHEST WITH CONTRAST TECHNIQUE: Multidetector CT imaging of the chest was performed using the standard protocol during bolus administration of intravenous contrast. Multiplanar CT image reconstructions and MIPs were obtained to evaluate the vascular anatomy. CONTRAST:  1033mOMNIPAQUE IOHEXOL 350 MG/ML SOLN COMPARISON:  None. FINDINGS: Cardiovascular: Heart is normal size. Mild calcified plaque over the left main, left anterior descending and lateral circumflex coronary arteries. Mild calcified plaque over the thoracic aorta. Thoracic aorta is otherwise normal in caliber. Pulmonary arterial system is well opacified and demonstrates no evidence of emboli. Remaining vascular structures are unremarkable. Mediastinum/Nodes: No mediastinal or hilar adenopathy. Remaining mediastinal structures are unremarkable. Lungs/Pleura: Lungs are adequately inflated with mild centrilobular emphysematous disease. Minimal lingular scarring. Tiny peripheral calcified granuloma over the left apex. Minimal opacification over the left lower lobe likely atelectasis and less likely infection. Small amount of aspirate material over the dependent portion of the distal trachea. Remaining airways are unremarkable. Upper Abdomen: Minimal sludge versus cholelithiasis as the gallbladder is only partially visualized. Calcified plaque over the abdominal aorta. 3.7 cm partially visualized right adrenal adenoma. No acute findings. Musculoskeletal: Degenerative change of the spine. Review of the MIP images confirms the above findings. IMPRESSION: No evidence of pulmonary embolism. Mild right basilar opacification likely atelectasis and less likely infection. Minimal aspirate material over the dependent portion of the distal trachea. Minimal lingular scarring. Aortic Atherosclerosis (ICD10-I70.0) and Emphysema (ICD10-J43.9). Minimal atherosclerotic coronary artery disease. Mild cholelithiasis/sludge. 3.7 cm right adrenal adenoma partially  visualized. Electronically Signed   By: DaMarin Olp.D.   On: 09/07/2019 09:50   Us[place]arotid Bilateral  Result Date: 09/07/2019 CLINICAL DATA:  Hypertension, syncope, hyperlipidemia EXAM: BILATERAL CAROTID DUPLEX ULTRASOUND TECHNIQUE: GrPearline Cablescale imaging, color Doppler and duplex ultrasound were performed of bilateral carotid and vertebral arteries in the neck. COMPARISON:  None. FINDINGS: Criteria: Quantification of carotid stenosis is based on velocity parameters that correlate the residual internal carotid diameter with NASCET-based stenosis levels, using  the diameter of the distal internal carotid lumen as the denominator for stenosis measurement. The following velocity measurements were obtained: RIGHT ICA: 425/69 cm/sec CCA: 676/72 cm/sec SYSTOLIC ICA/CCA RATIO:  3.6 ECA: 445 cm/sec LEFT ICA: 351/46 cm/sec CCA: 094/70 cm/sec SYSTOLIC ICA/CCA RATIO:  2.6 ECA: 328 cm/sec RIGHT CAROTID ARTERY: Extensive carotid bifurcation atherosclerosis. Marked velocity elevation in the proximal right ICA measuring 425/69 centimeters/second. Exam is difficult because of the degree of calcification. Right ICA stenosis estimated at greater than 70% by ultrasound criteria. RIGHT VERTEBRAL ARTERY:  Antegrade LEFT CAROTID ARTERY: Extensive left carotid bifurcation atherosclerosis. Degree of calcification limits the exam. Proximal left ICA velocity elevation measures 351/46 centimeters/second with turbulent flow noted. Left ICA stenosis also estimated greater than 70%. LEFT VERTEBRAL ARTERY:  Not visualized, likely occluded IMPRESSION: Extensive bilateral carotid atherosclerosis. Bilateral ICA stenoses estimated at greater than 70% by ultrasound criteria. Patent antegrade right vertebral artery. 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 These results will be called to the ordering clinician or representative by the Radiologist Assistant, and communication documented in the PACS or zVision Dashboard. Electronically Signed   By: Jerilynn Mages.   Shick M.D.   On: 09/07/2019 14:55   Dg Chest Port 1 View  Result Date: 09/07/2019 CLINICAL DATA:  Wheezing and shortness of breath. History of COPD. EXAM: PORTABLE CHEST 1 VIEW COMPARISON:  None. FINDINGS: Heart is normal in size. Aortic atherosclerosis. Mild bilateral hilar prominence. There is central peribronchial thickening. Patchy bibasilar opacities. No pleural effusion, right costophrenic angle not entirely included in the field of view. No pneumothorax. IMPRESSION: 1. Central bronchial thickening. Patchy bibasilar opacities, atelectasis versus pneumonia. 2. Mild bilateral hilar prominence is likely related to overlapping vascular structures, but nonspecific. Aortic Atherosclerosis (ICD10-I70.0). Electronically Signed   By: Keith Rake M.D.   On: 09/07/2019 02:08   US Abdomen Limited Ruq  Result Date: 09/07/2019 CLINICAL DATA:  Right upper quadrant pain and mid abdominal pain. EXAM: ULTRASOUND ABDOMEN LIMITED RIGHT UPPER QUADRANT COMPARISON:  Chest CT today. FINDINGS: Gallbladder: Mild to moderate cholelithiasis with borderline wall thickening of 3.3 mm. Largest stone measures 1 cm. Negative sonographic Murphy sign. No adjacent pericholecystic fluid. Common bile duct: Diameter: 3.6 mm. Liver: Moderate coarse increased parenchymal echogenicity without focal mass compatible with steatosis. Portal vein is patent on color Doppler imaging with normal direction of blood flow towards the liver. Other: No free fluid. IMPRESSION: Mild to moderate cholelithiasis with borderline wall thickening. No definitive findings for acute cholecystitis. Hepatic steatosis without focal mass. Electronically Signed   By: Marin Olp M.D.   On: 09/07/2019 10:51    Micro Results    Recent Results (from the past 240 hour(s))  SARS Coronavirus 2 [facility] order, Performed in [facility] hospital lab) Nasopharyngeal Nasopharyngeal Swab     Status: None   Collection Time: 09/07/19  1:29 AM   Specimen: Nasopharyngeal  Swab  Result Value Ref Range Status   SARS Coronavirus 2 NEGATIVE NEGATIVE Final    Comment: (NOTE) If result is NEGATIVE SARS-CoV-2 target nucleic acids are NOT DETECTED. The SARS-CoV-2 RNA is generally detectable in upper and lower  respiratory specimens during the acute phase of infection. The lowest  concentration of SARS-CoV-2 viral copies this assay can detect is 250  copies / mL. A negative result does not preclude SARS-CoV-2 infection  and should not be used as the sole basis for treatment or other  patient management decisions.  A negative result may occur with  improper specimen collection / handling, submission of specimen other  than nasopharyngeal swab, presence of viral mutation(s) within the  areas targeted by this assay, and inadequate number of viral copies  (<250 copies / mL). A negative result must be combined with clinical  observations, patient history, and epidemiological information. If result is POSITIVE SARS-CoV-2 target nucleic acids are DETECTED. The SARS-CoV-2 RNA is generally detectable in upper and lower  respiratory specimens dur ing the acute phase of infection.  Positive  results are indicative of active infection with SARS-CoV-2.  Clinical  correlation with patient history and other diagnostic information is  necessary to determine patient infection status.  Positive results do  not rule out bacterial infection or co-infection with other viruses. If result is PRESUMPTIVE POSTIVE SARS-CoV-2 nucleic acids MAY BE PRESENT.   A presumptive positive result was obtained on the submitted specimen  and confirmed on repeat testing.  While 2019 novel coronavirus  (SARS-CoV-2) nucleic acids may be present in the submitted sample  additional confirmatory testing may be necessary for epidemiological  and / or clinical management purposes  to differentiate between  SARS-CoV-2 and other Sarbecovirus currently known to infect humans.  If clinically indicated  additional testing with an alternate test  methodology [phone]) is advised. The SARS-CoV-2 RNA is generally  detectable in upper and lower respiratory sp ecimens during the acute  phase of infection. The expected result is Negative. Fact Sheet for Patients:  StrictlyIdeas.no Fact Sheet for Healthcare Providers: BankingDealers.co.za This test is not yet approved or cleared by the [place] FDA and has been authorized for detection and/or diagnosis of SARS-CoV-2 by FDA under an Emergency Use Authorization (EUA).  This EUA will remain in effect (meaning this test can be used) for the duration of the COVID-19 declaration under Section 564(b)(1) of the Act, 21 U.S.C. section 360bbb-3(b)(1), unless the authorization is terminated or revoked sooner. Performed at [facility], [address], [place], [place] [address]   Culture, blood (routine x 2)     Status: None (Preliminary result)   Collection Time: 09/07/19  7:55 AM   Specimen: Right Antecubital; Blood  Result Value Ref Range Status   Specimen Description   Final    RIGHT ANTECUBITAL BOTTLES DRAWN AEROBIC AND ANAEROBIC   Special Requests Blood Culture adequate volume  Final   Culture   Final    NO GROWTH 2 DAYS Performed at [facility], [address], [place], [place] [address]    Report Status PENDING  Incomplete  Culture, blood (routine x 2)     Status: None (Preliminary result)   Collection Time: 09/07/19  7:56 AM   Specimen: BLOOD RIGHT ARM  Result Value Ref Range Status   Specimen Description BLOOD RIGHT ARM BOTTLES DRAWN AEROBIC ONLY  Final   Special Requests Blood Culture adequate volume  Final   Culture   Final    NO GROWTH 2 DAYS Performed at [facility], [address] [place], [place] [address]    Report Status PENDING  Incomplete       Today   Subjective    Nathan Hart today has no new concerns -Eager to go home          Patient has been seen and  examined prior to discharge   Objective   Blood pressure (!) 159/101, pulse 66, temperature 97.6 F (36.4 C), temperature source Oral, resp. rate 20, height _0  (1.778 m), weight 85.5 kg, SpO2 94 %.   Intake/Output Summary (Last 24 hours) at 09/09/2019 1539 Last data filed at 09/09/2019 [phone]  Gross per 24 hour  Intake 2654.38 ml  Output 1300 ml  Net 1354.38 ml    Exam Gen:- Awake Alert, no acute distress  HEENT:- [place].AT, No sclera icterus Neck-Supple Neck,No JVD,.  Nose- [place] 2L/min Lungs-improving air movement bilaterally, no wheezing  CV- S1, S2 normal, regular Abd-  +ve B.Sounds, Abd Soft, No tenderness,    Extremity/Skin:- No  edema,   good pulses Psych-affect is appropriate, oriented x3 Neuro-no new focal deficits,   Data Review   CBC w Diff:  Lab Results  Component Value Date   WBC 8.0 09/08/2019   HGB 18.1 (H) 09/08/2019   HCT 53.1 (H) 09/08/2019   PLT 142 (L) 09/08/2019   LYMPHOPCT 32 09/07/2019   MONOPCT 6 09/07/2019   EOSPCT 1 09/07/2019   BASOPCT 1 09/07/2019    CMP:  Lab Results  Component Value Date   NA 139 09/08/2019   K 3.9 09/08/2019   CL 102 09/08/2019   CO2 27 09/08/2019   BUN 13 09/08/2019   CREATININE 0.69 09/08/2019   PROT 7.6 09/08/2019   ALBUMIN 3.8 09/08/2019   BILITOT 1.6 (H) 09/08/2019   ALKPHOS 49 09/08/2019   AST 41 09/08/2019   ALT 52 (H) 09/08/2019  . Total Discharge time is about 33 minutes  Roxan Hockey M.D on 09/09/2019 at 3:39 PM  Go to [email or web address] -  for contact info  [facility] - Office  [phone]

## 2019-09-09 NOTE — Care Plan (Signed)
SATURATION QUALIFICATIONS: (This note is used to comply with regulatory documentation for home oxygen)  Patient Saturations on Room Air at Rest 90%  Patient Saturations on Room Air while Ambulating = 87%  Patient Saturations on 96% Liters of oxygen while Ambulating = 2L

## 2019-09-09 NOTE — Discharge Instructions (Signed)
1)You are taking prednisone and aspirin so avoid ibuprofen/Advil/Aleve/Motrin/Goody Powders/Naproxen/BC powders/Meloxicam/Diclofenac/Indomethacin and other Nonsteroidal anti-inflammatory medications as these will make you more likely to bleed and can cause stomach ulcers, can also cause Kidney problems.  2)Use oxygen continuously at 2 L/min via nasal cannula as prescribed--- absolutely no open fires and No  smoking around oxygen due to risk of fire, and Death 3)Complete smoking cessation advised--- please use nicotine patch to help you quit smoking 4)Abstinence from alcohol strongly encouraged--consider alcohol rehab programs and AA meetings.  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 Delray Alt diagnostic Cerebral arteriogram--on Friday 09/12/2019 at 1000 am at [facility] have to be fasting for this test (Nothing by Mouth except for Medications)--- talk to the main entrance at [facility] ([address]-  -You will need a driver as you will be unable to drive after this procedure

## 2019-09-09 NOTE — TOC Transition Note (Signed)
Transition of Care [facility]) - CM/SW Discharge Note   Patient Details  Name: Nathan Hart MRN: [id number] Date of Birth: 11-23-1956  [facility] CM/SW Contact:  Boneta Lucks, RN Phone Number: 09/09/2019, 1:21 PM   Clinical Narrative:   Patient discharging home with home oxygen, orders placed, Juliann Pulse with [place] on site to deliver tank.  Patient was offered SA resources refused. Patient states he will continue to smoke and drink. Patient was advised of danger of smoke and oxygen. " I will not do them at the same time."  States he understands danger.       Barriers to Discharge: Continued Medical Work up   Patient Goals and CMS Choice Patient states their goals for this hospitalization and ongoing recovery are:: to go home. CMS Medicare.gov Compare Post Acute Care list provided to:: Patient Choice offered to / list presented to : Patient  Discharge Placement     Discharge Plan and Services   Discharge Planning Services: Other - See comment(Home 02)            DME Arranged: Oxygen DME Agency: [employer] Date DME Agency Contacted: 09/08/19 Time DME Agency Contacted: 80 Representative spoke with at DME Agency: Blake Divine    Readmission Risk Interventions No flowsheet data found.

## 2019-09-11 ENCOUNTER — Other Ambulatory Visit: Payer: Self-pay | Admitting: Radiology

## 2019-09-11 ENCOUNTER — Other Ambulatory Visit (HOSPITAL_COMMUNITY): Payer: Self-pay | Admitting: Interventional Radiology

## 2019-09-11 DIAGNOSIS — I771 Stricture of artery: Secondary | ICD-10-CM

## 2019-09-12 ENCOUNTER — Encounter (HOSPITAL_COMMUNITY): Payer: Self-pay

## 2019-09-12 ENCOUNTER — Ambulatory Visit (HOSPITAL_COMMUNITY): Admit: 2019-09-12 | Payer: Medicaid Other

## 2019-09-12 ENCOUNTER — Ambulatory Visit (HOSPITAL_COMMUNITY): Payer: Medicaid Other

## 2019-09-12 ENCOUNTER — Other Ambulatory Visit (HOSPITAL_COMMUNITY): Payer: Self-pay | Admitting: Interventional Radiology

## 2019-09-12 ENCOUNTER — Ambulatory Visit (HOSPITAL_COMMUNITY)
Admission: RE | Admit: 2019-09-12 | Discharge: 2019-09-12 | Disposition: A | Discharge status: Home / Self Care | Payer: Medicaid Other | Source: Ambulatory Visit | Attending: Interventional Radiology | Admitting: Interventional Radiology

## 2019-09-12 ENCOUNTER — Other Ambulatory Visit: Payer: Self-pay

## 2019-09-12 DIAGNOSIS — Z79899 Other long term (current) drug therapy: Secondary | ICD-10-CM | POA: Diagnosis not present

## 2019-09-12 DIAGNOSIS — F1010 Alcohol abuse, uncomplicated: Secondary | ICD-10-CM | POA: Insufficient documentation

## 2019-09-12 DIAGNOSIS — I10 Essential (primary) hypertension: Secondary | ICD-10-CM | POA: Diagnosis not present

## 2019-09-12 DIAGNOSIS — Z7982 Long term (current) use of aspirin: Secondary | ICD-10-CM | POA: Diagnosis not present

## 2019-09-12 DIAGNOSIS — H5440 Blindness, one eye, unspecified eye: Secondary | ICD-10-CM | POA: Diagnosis not present

## 2019-09-12 DIAGNOSIS — F17210 Nicotine dependence, cigarettes, uncomplicated: Secondary | ICD-10-CM | POA: Insufficient documentation

## 2019-09-12 DIAGNOSIS — I6523 Occlusion and stenosis of bilateral carotid arteries: Secondary | ICD-10-CM | POA: Insufficient documentation

## 2019-09-12 DIAGNOSIS — I771 Stricture of artery: Secondary | ICD-10-CM

## 2019-09-12 DIAGNOSIS — J449 Chronic obstructive pulmonary disease, unspecified: Secondary | ICD-10-CM | POA: Insufficient documentation

## 2019-09-12 HISTORY — PX: IR ANGIO VERTEBRAL SEL VERTEBRAL UNI R MOD SED: IMG5368

## 2019-09-12 HISTORY — PX: IR ANGIO VERTEBRAL SEL SUBCLAVIAN INNOMINATE UNI L MOD SED: IMG5364

## 2019-09-12 HISTORY — PX: IR ANGIO INTRA EXTRACRAN SEL COM CAROTID INNOMINATE BILAT MOD SED: IMG5360

## 2019-09-12 HISTORY — PX: IR US GUIDE VASC ACCESS RIGHT: IMG2390

## 2019-09-12 LAB — BASIC METABOLIC PANEL
Anion gap: 8 (ref 5–15)
BUN: 15 mg/dL (ref 8–23)
CO2: 26 mmol/L (ref 22–32)
Calcium: 8.4 mg/dL — ABNORMAL LOW (ref 8.9–10.3)
Chloride: 103 mmol/L (ref 98–111)
Creatinine, Ser: 0.81 mg/dL (ref 0.61–1.24)
GFR calc Af Amer: >60 mL/min (ref >60)
GFR calc non Af Amer: >60 mL/min (ref >60)
Glucose, Bld: 117 mg/dL — ABNORMAL HIGH (ref 70–99)
Potassium: 3.7 mmol/L (ref 3.5–5.1)
Sodium: 137 mmol/L (ref 135–145)

## 2019-09-12 LAB — CBC
HCT: 49.3 % (ref 39.0–52.0)
Hemoglobin: 17.8 g/dL — ABNORMAL HIGH (ref 13.0–17.0)
MCH: 35.3 pg — ABNORMAL HIGH (ref 26.0–34.0)
MCHC: 36.1 g/dL — ABNORMAL HIGH (ref 30.0–36.0)
MCV: 97.8 fL (ref 80.0–100.0)
Platelets: 162 10*3/uL (ref 150–400)
RBC: 5.04 MIL/uL (ref 4.22–5.81)
RDW: 12.5 % (ref 11.5–15.5)
WBC: 8.6 10*3/uL (ref 4.0–10.5)
nRBC: 0 % (ref 0.0–0.2)

## 2019-09-12 LAB — ETHANOL: Alcohol, Ethyl (B): <10 mg/dL (ref <10)

## 2019-09-12 LAB — PROTIME-INR
INR: 1.1 (ref 0.8–1.2)
Prothrombin Time: 14 seconds (ref 11.4–15.2)

## 2019-09-12 MED ORDER — HEPARIN SODIUM (PORCINE) 1000 UNIT/ML IJ SOLN
INTRAMUSCULAR | Status: AC
  Filled 2019-09-12: qty 1

## 2019-09-12 MED ORDER — NITROGLYCERIN 1 MG/10 ML FOR IR/CATH LAB
INTRA_ARTERIAL | Status: AC | PRN
  Administered 2019-09-12: 400 ug via INTRA_ARTERIAL

## 2019-09-12 MED ORDER — NITROGLYCERIN 1 MG/10 ML FOR IR/CATH LAB
INTRA_ARTERIAL | Status: AC
  Filled 2019-09-12: qty 10

## 2019-09-12 MED ORDER — MIDAZOLAM HCL 2 MG/2ML IJ SOLN
INTRAMUSCULAR | Status: AC
  Filled 2019-09-12: qty 2

## 2019-09-12 MED ORDER — SODIUM CHLORIDE 0.9 % IV SOLN: Freq: Once | INTRAVENOUS | Status: DC

## 2019-09-12 MED ORDER — LIDOCAINE HCL 1 % IJ SOLN
INTRAMUSCULAR | Status: AC
  Filled 2019-09-12: qty 20

## 2019-09-12 MED ORDER — IOHEXOL 300 MG/ML  SOLN: 150.0000 mL | Freq: Once | INTRAMUSCULAR | Status: DC | PRN

## 2019-09-12 MED ORDER — VERAPAMIL HCL 2.5 MG/ML IV SOLN
INTRAVENOUS | Status: AC | PRN
  Administered 2019-09-12: 2.5 mg via INTRA_ARTERIAL

## 2019-09-12 MED ORDER — SODIUM CHLORIDE 0.9 % IV SOLN: INTRAVENOUS | Status: AC

## 2019-09-12 MED ORDER — FENTANYL CITRATE (PF) 100 MCG/2ML IJ SOLN
INTRAMUSCULAR | Status: AC | PRN
  Administered 2019-09-12: 25 ug via INTRAVENOUS

## 2019-09-12 MED ORDER — LIDOCAINE HCL 1 % IJ SOLN
INTRAMUSCULAR | Status: AC | PRN
  Administered 2019-09-12: 2 mL

## 2019-09-12 MED ORDER — HEPARIN SODIUM (PORCINE) 1000 UNIT/ML IJ SOLN
INTRAMUSCULAR | Status: AC | PRN
  Administered 2019-09-12: 2000 [IU] via INTRA_ARTERIAL

## 2019-09-12 MED ORDER — FENTANYL CITRATE (PF) 100 MCG/2ML IJ SOLN
INTRAMUSCULAR | Status: AC
  Filled 2019-09-12: qty 2

## 2019-09-12 MED ORDER — MIDAZOLAM HCL 2 MG/2ML IJ SOLN
INTRAMUSCULAR | Status: AC | PRN
  Administered 2019-09-12: 1 mg via INTRAVENOUS

## 2019-09-12 MED ORDER — VERAPAMIL HCL 2.5 MG/ML IV SOLN
INTRAVENOUS | Status: AC
  Filled 2019-09-12: qty 2

## 2019-09-12 NOTE — Discharge Instructions (Addendum)
Radial Site Care ° °This sheet gives you information about how to care for yourself after your procedure. Your health care provider may also give you more specific instructions. If you have problems or questions, contact your health care provider. °What can I expect after the procedure? °After the procedure, it is common to have: °· Bruising and tenderness at the catheter insertion area. °Follow these instructions at home: °Medicines °· Take over-the-counter and prescription medicines only as told by your health care provider. °Insertion site care °· Follow instructions from your health care provider about how to take care of your insertion site. Make sure you: °? Wash your hands with soap and water before you change your bandage (dressing). If soap and water are not available, use hand sanitizer. °? Change your dressing as told by your health care provider. °? Leave stitches (sutures), skin glue, or adhesive strips in place. These skin closures may need to stay in place for 2 weeks or longer. If adhesive strip edges start to loosen and curl up, you may trim the loose edges. Do not remove adhesive strips completely unless your health care provider tells you to do that. °· Check your insertion site every day for signs of infection. Check for: °? Redness, swelling, or pain. °? Fluid or blood. °? Pus or a bad smell. °? Warmth. °· Do not take baths, swim, or use a hot tub until your health care provider approves. °· You may shower 24-48 hours after the procedure, or as directed by your health care provider. °? Remove the dressing and gently wash the site with plain soap and water. °? Pat the area dry with a clean towel. °? Do not rub the site. That could cause bleeding. °· Do not apply powder or lotion to the site. °Activity ° °· For 24 hours after the procedure, or as directed by your health care provider: °? Do not flex or bend the affected arm. °? Do not push or pull heavy objects with the affected arm. °? Do not  drive yourself home from the hospital or clinic. You may drive 24 hours after the procedure unless your health care provider tells you not to. °? Do not operate machinery or power tools. °· Do not lift anything that is heavier than 10 lb (4.5 kg), or the limit that you are told, until your health care provider says that it is safe. °· Ask your health care provider when it is okay to: °? Return to work or school. °? Resume usual physical activities or sports. °? Resume sexual activity. °General instructions °· If the catheter site starts to bleed, raise your arm and put firm pressure on the site. If the bleeding does not stop, get help right away. This is a medical emergency. °· If you went home on the same day as your procedure, a responsible adult should be with you for the first 24 hours after you arrive home. °· Keep all follow-up visits as told by your health care provider. This is important. °Contact a health care provider if: °· You have a fever. °· You have redness, swelling, or yellow drainage around your insertion site. °Get help right away if: °· You have unusual pain at the radial site. °· The catheter insertion area swells very fast. °· The insertion area is bleeding, and the bleeding does not stop when you hold steady pressure on the area. °· Your arm or hand becomes pale, cool, tingly, or numb. °These symptoms may represent a serious problem   that is an emergency. Do not wait to see if the symptoms will go away. Get medical help right away. Call your local emergency services (911 in the [place] Do not drive yourself to the hospital. °Summary °· After the procedure, it is common to have bruising and tenderness at the site. °· Follow instructions from your health care provider about how to take care of your radial site wound. Check the wound every day for signs of infection. °· Do not lift anything that is heavier than 10 lb (4.5 kg), or the limit that you are told, until your health care provider says  that it is safe. °This information is not intended to replace advice given to you by your health care provider. Make sure you discuss any questions you have with your health care provider. °Document Released: 01/13/2011 Document Revised: 01/16/2018 Document Reviewed: 01/16/2018 °Elsevier Patient Education © 2020 [employer] Inc[employer] °Cerebral Angiogram, Care After °This sheet gives you information about how to care for yourself after your procedure. Your health care provider may also give you more specific instructions. If you have problems or questions, contact your health care provider. °What can I expect after the procedure? °After your procedure, it is common to have: °· Bruising and tenderness at the catheter insertion site. °· A mild headache. °Follow these instructions at home: °Insertion site care ° °· Follow instructions from your health care provider about how to take care of the insertion site. Make sure you: °? Wash your hands with soap and water before you change your bandage (dressing). If soap and water are not available, use hand sanitizer. °? Change your dressing as told by your health care provider. °? Leave stitches (sutures), skin glue, or adhesive strips in place. These skin closures may need to stay in place for 2 weeks or longer. If adhesive strip edges start to loosen and curl up, you may trim the loose edges. Do not remove adhesive strips completely unless your health care provider tells you to do that. °· Do not apply powder or lotion to the site. °· Do not take baths, swim, or use a hot tub until your health care provider says it is okay to do so. °· You may shower 24-48 hours after the procedure or as told by your health care provider. °? Gently wash the site with plain soap and water. °? Pat the area dry with a clean towel. °? Do not rub the site. This may cause bleeding. °· Check your incision area every day for signs of infection. Check for: °? Redness, swelling, or pain. °? Fluid or  blood. °? Warmth. °? Pus or a bad smell. °Activity °· Rest as told by your health care provider. °· Do not lift anything that is heavier than 10 lb (4.5 kg), or the limit that your health care provider tells you, until he or she says that it is safe. °· Do not drive for 24 hours if you were given a medicine to help you relax (sedative). °· Do not drive or use heavy machinery while taking prescription pain medicine. °General instructions ° °· Return to your normal activities as told by your health care provider. Ask your health care provider what activities are safe for you. °· If the catheter site starts to bleed, lie flat and put pressure on the site. °· Drink enough fluid to keep your urine clear or pale yellow. This helps flush the contrast dye from your body. °· Take over-the-counter and prescription medicines only as told   by your health care provider. °· Keep all follow-up visits as directed by your health care provider. This is important. °Contact a health care provider if: °· You have a fever or chills. °· You have redness, swelling, or more pain around your insertion site. °· You have fluid or blood coming from your insertion site. °· The insertion site feels warm to the touch. °· You have pus or a bad smell coming from your insertion site. °· You have more bruising around the insertion site. °· Blood collects in the tissue around the catheter site (hematoma), and the hematoma may be painful to the touch. °Get help right away if: °· You have vision changes or loss of vision. °· The catheter insertion area swells very fast. °· You have numbness or weakness on one side of your body. °· You have trouble talking, or you have slurred speech or cannot speak (aphasia). °· You feel confused or have trouble remembering. °· You have severe pain at the catheter insertion area. °· The catheter insertion area is bleeding, and the bleeding does not stop after 30 minutes of holding steady pressure on the site. °· The arm  or leg where the catheter was inserted is numb, tingling, or cold. °· You have chest pain. °· You have trouble breathing. °· You have a rash. °· You have trouble using the arm or leg where the catheter was inserted. °These symptoms may represent a serious problem that is an emergency. Do not wait to see if the symptoms will go away. Get medical help right away. Call your local emergency services (911 in [place] Do not drive yourself to the hospital. °Summary °· After your procedure, it is common to have bruising and tenderness at the site where the catheter was inserted. °· If your catheter insertion site begins to bleed, put direct pressure on it until the bleeding stops. °· After your procedure, it is important to rest and drink plenty of fluids. °· Return to your normal activities as told by your health care provider. Ask your health care provider what activities are safe for you. °This information is not intended to replace advice given to you by your health care provider. Make sure you discuss any questions you have with your health care provider. °Document Released: 04/27/2014 Document Revised: 11/23/2017 Document Reviewed: 01/15/2017 °Elsevier Patient Education © 2020 [employer] Inc[employer] °Moderate Conscious Sedation, Adult, Care After °These instructions provide you with information about caring for yourself after your procedure. Your health care provider may also give you more specific instructions. Your treatment has been planned according to current medical practices, but problems sometimes occur. Call your health care provider if you have any problems or questions after your procedure. °What can I expect after the procedure? °After your procedure, it is common: °· To feel sleepy for several hours. °· To feel clumsy and have poor balance for several hours. °· To have poor judgment for several hours. °· To vomit if you eat too soon. °Follow these instructions at home: °For at least 24 hours after the  procedure: ° °· Do not: °? Participate in activities where you could fall or become injured. °? Drive. °? Use heavy machinery. °? Drink alcohol. °? Take sleeping pills or medicines that cause drowsiness. °? Make important decisions or sign legal documents. °? Take care of children on your own. °· Rest. °Eating and drinking °· Follow the diet recommended by your health care provider. °· If you vomit: °? Drink water, juice, or soup when   you can drink without vomiting. °? Make sure you have little or no nausea before eating solid foods. °General instructions °· Have a responsible adult stay with you until you are awake and alert. °· Take over-the-counter and prescription medicines only as told by your health care provider. °· If you smoke, do not smoke without supervision. °· Keep all follow-up visits as told by your health care provider. This is important. °Contact a health care provider if: °· You keep feeling nauseous or you keep vomiting. °· You feel light-headed. °· You develop a rash. °· You have a fever. °Get help right away if: °· You have trouble breathing. °This information is not intended to replace advice given to you by your health care provider. Make sure you discuss any questions you have with your health care provider. °Document Released: 10/01/2013 Document Revised: 11/23/2017 Document Reviewed: 04/01/2016 °Elsevier Patient Education © 2020 Elsevier Inc. ° °

## 2019-09-12 NOTE — Procedures (Signed)
S/P bilateral CCA ,RT  VA and Lr subclavian arteriograms. RT Radial approach.. Findings. 1.Occluded Lt VA prox. 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

## 2019-09-12 NOTE — Progress Notes (Signed)
Patient ID: TREVORIS PIETERS, male   DOB: 01-03-1956, 63 y.o.   MRN: [id number]   S/P bilateral CCA ,RT  VA and Lt subclavian arteriograms. RT Radial approach.. Findings. 1.Occluded Lt VA prox. 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  Pt now to start Plavix 75 mg daily along with ASA 81 mg Rx given to wife Laverta Baltimore  To be scheduled R ICA revascularization in 2 weeks Will hear from [facility] scheduler for date and time  She has good understanding of plan

## 2019-09-12 NOTE — H&P (Signed)
Chief Complaint: Patient was seen in consultation today for cerebral arteriogram   Referring Physician(s): Dr Maurene Capes  Supervising Physician: Luanne Bras  Patient Status: [facility] - Out-pt  History of Present Illness: Nathan Hart is a 63 y.o. male   Pt presented to ED 09/07/19-- found down at home Right sided chest pain Known ETOH abuse     apparent fall at home  Work up included CT and [place]  US neck revealed Bilat Carotid stenosis  CTA:  IMPRESSION: 1. Advanced atherosclerosis with right ICA stenosis measuring ~75% and left bifurcation stenosis measuring ~70%. 2. 60% distal left common carotid stenosis. 3. Thready flow in the proximal non-dominant left vertebral artery which is patent at the dura and basilar.  TRH followed pt during admission--- DC 09/09/19 Dr Denton Brick discussed with Dr Estanislado Pandy CTA findings Planned for OP Cerebral arteriogram to evaluate stenosis  Scheduled now for cerebral arteriogram Pt is legally blind Denies dizziness; N/V Denies vision or speech changes  Complaint is pain in Rt side-- persistent  Past Medical History:  Diagnosis Date   Abnormal peripheral vision of left eye    Blind right eye    COPD (chronic obstructive pulmonary disease) ([place])    not on home o2   Empyema lung (HCC)    ETOH abuse    HTN (hypertension)     Past Surgical History:  Procedure Laterality Date   BACK SURGERY     CLAVICLE SURGERY     KNEE SURGERY     TALC PLEURODESIS     [place]    Allergies: Patient has no known allergies.  Medications: Prior to Admission medications   Medication Sig Start Date End Date Taking? Authorizing Provider  albuterol (PROVENTIL) (2.5 MG/3ML) 0.083% nebulizer solution Take 3 mLs (2.5 mg total) by nebulization every 6 (six) hours as needed for wheezing or shortness of breath. For breathing 09/09/19   Roxan Hockey, MD  amLODipine (NORVASC) 10 MG tablet Take 1 tablet (10 mg total) by mouth  daily. For BP 09/10/19   Roxan Hockey, MD  aspirin EC 81 MG tablet Take 1 tablet (81 mg total) by mouth daily with breakfast. 09/09/19 09/08/20  Roxan Hockey, MD  atenolol (TENORMIN) 50 MG tablet Take 1 tablet (50 mg total) by mouth daily. For BP 09/10/19   Emokpae, Courage, MD  atorvastatin (LIPITOR) 40 MG tablet Take 1 tablet (40 mg total) by mouth every evening. For Cholesterol 09/09/19   Roxan Hockey, MD  doxycycline (VIBRA-TABS) 100 MG tablet Take 1 tablet (100 mg total) by mouth 2 (two) times daily for 5 days. 09/09/19 09/14/19  Roxan Hockey, MD  folic acid (FOLVITE) 1 MG tablet Take 1 tablet (1 mg total) by mouth daily. 09/10/19   Roxan Hockey, MD  guaiFENesin (MUCINEX) 600 MG 12 hr tablet Take 1 tablet (600 mg total) by mouth 2 (two) times daily for 10 days. 09/09/19 09/19/19  Roxan Hockey, MD  methocarbamol (ROBAXIN) 750 MG tablet Take 1 tablet (750 mg total) by mouth 3 (three) times daily. For muscle pain 09/09/19   Roxan Hockey, MD  nicotine (NICODERM CQ - DOSED IN MG/24 HOURS) 21 mg/24hr patch Place 1 patch (21 mg total) onto the skin daily. 09/09/19   Roxan Hockey, MD  oxyCODONE (OXY IR/ROXICODONE) 5 MG immediate release tablet Take 1 tablet (5 mg total) by mouth every 6 (six) hours as needed for up to 5 days for severe pain. 09/09/19 09/14/19  Roxan Hockey, MD  polyethylene glycol (MIRALAX / Floria Raveling)  17 g packet Take 17 g by mouth daily. For bowel 09/10/19   Roxan Hockey, MD  predniSONE (DELTASONE) 20 MG tablet Take 2 tablets (40 mg total) by mouth daily with breakfast. For lungs 09/09/19   Roxan Hockey, MD  thiamine 100 MG tablet Take 1 tablet (100 mg total) by mouth daily. 09/10/19   Roxan Hockey, MD  umeclidinium bromide (INCRUSE ELLIPTA) 62.5 MCG/INH AEPB Inhale 1 puff into the lungs daily. For breathing 09/10/19   Roxan Hockey, MD     Family History  Problem Relation Age of Onset   Cancer Mother     Social History   Socioeconomic History     Marital status: Legally Separated    Spouse name: Not on file   Number of children: Not on file   Years of education: Not on file   Highest education level: Not on file  Occupational History   Not on file  Social Needs   Financial resource strain: Not on file   Food insecurity    Worry: Not on file    Inability: Not on file   Transportation needs    Medical: Not on file    Non-medical: Not on file  Tobacco Use   Smoking status: Current Every Day Smoker    Types: Cigarettes   Smokeless tobacco: Never Used  Substance and Sexual Activity   Alcohol use: Yes    Alcohol/week: 20.0 standard drinks    Types: 20 Cans of beer per week   Drug use: Not on file   Sexual activity: Not on file  Lifestyle   Physical activity    Days per week: Not on file    Minutes per session: Not on file   Stress: Not on file  Relationships   Social connections    Talks on phone: Not on file    Gets together: Not on file    Attends religious service: Not on file    Active member of club or organization: Not on file    Attends meetings of clubs or organizations: Not on file    Relationship status: Not on file  Other Topics Concern   Not on file  Social History Narrative   Not on file    Review of Systems: A 12 point ROS discussed and pertinent positives are indicated in the HPI above.  All other systems are negative.  Review of Systems  Constitutional: Positive for activity change and appetite change. Negative for fatigue and fever.  HENT: Negative for sore throat and tinnitus.   Eyes:       Legally blind  Respiratory: Negative for cough.   Gastrointestinal: Negative for abdominal pain.  Musculoskeletal: Negative for back pain and gait problem.  Neurological: Negative for dizziness, tremors, seizures, syncope, facial asymmetry, speech difficulty, weakness, light-headedness, numbness and headaches.  Psychiatric/Behavioral: Negative for behavioral problems and confusion.     Vital Signs: BP (!) 154/77    Pulse 63    Temp 97.8 F (36.6 C) (Skin)    Resp 18    Ht 5\' 10"  (1.778 m)    Wt 175 lb (79.4 kg)    SpO2 100%    BMI 25.11 kg/m   Physical Exam Vitals signs reviewed.  Constitutional:      Appearance: Normal appearance.  HENT:     Head: Atraumatic.     Mouth/Throat:     Mouth: Mucous membranes are moist.  Eyes:     Extraocular Movements: Extraocular movements intact.  Neck:  Musculoskeletal: Normal range of motion.  Cardiovascular:     Rate and Rhythm: Normal rate and regular rhythm.     Heart sounds: Normal heart sounds.  Pulmonary:     Effort: Pulmonary effort is normal.     Breath sounds: Normal breath sounds.  Abdominal:     Palpations: Abdomen is soft.  Musculoskeletal: Normal range of motion.     Comments: Right sided chest area pain  Skin:    General: Skin is warm and dry.  Neurological:     Mental Status: He is alert and oriented to person, place, and time.  Psychiatric:        Mood and Affect: Mood normal.        Behavior: Behavior normal.        Thought Content: Thought content normal.        Judgment: Judgment normal.     Imaging: Ct Head Wo Contrast  Result Date: 09/07/2019 CLINICAL DATA:  Found on the floor last night. EXAM: CT HEAD WITHOUT CONTRAST TECHNIQUE: Contiguous axial images were obtained from the base of the skull through the vertex without intravenous contrast. COMPARISON:  None. FINDINGS: Brain: No focal abnormality is seen affecting the brainstem or cerebellum. Right cerebral hemisphere shows an old right frontoparietal cortical and subcortical infarction consistent with MCA branch vessel territory infarction, progressed to atrophy, encephalomalacia and gliosis. There is low-density in the left basal ganglia which probably represents an old stroke, though the possibility of subacute infarction in that region is not completely excluded. No evidence of mass effect or hemorrhage. No hydrocephalus or extra-axial  collection. Vascular: There is atherosclerotic calcification of the major vessels at the base of the brain. Skull: Negative Sinuses/Orbits: Chronic opacification of the right maxillary sinus. Other sinuses clear. Other: None IMPRESSION: No definite acute finding by CT. 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 Probably old left basal ganglia infarction. There is some potential there could be subacute infarction in the left basal ganglia. No hemorrhage or mass effect. Chronic inflammatory changes of the right maxillary sinus. Electronically Signed   By: Nelson Chimes M.D.   On: 09/07/2019 09:39   Ct Angio Neck W Or Wo Contrast  Result Date: 09/08/2019 CLINICAL DATA:  Syncope.  Bilateral ICA stenosis by ultrasound EXAM: CT ANGIOGRAPHY NECK TECHNIQUE: Multidetector CT imaging of the neck was performed using the standard protocol during bolus administration of intravenous contrast. Multiplanar CT image reconstructions and MIPs were obtained to evaluate the vascular anatomy. Carotid stenosis measurements (when applicable) are obtained utilizing NASCET criteria, using the distal internal carotid diameter as the denominator. CONTRAST:  183mL OMNIPAQUE IOHEXOL 350 MG/ML SOLN COMPARISON:  None. FINDINGS: Aortic arch: Atherosclerotic plaque.  Three vessel branching. Right carotid system: Diffuse atheromatous wall thickening of the common carotid artery with particularly bulky plaque about the bifurcation. Proximal ICA stenosis measures 75%. Left carotid system: Extensive atherosclerotic plaque on the common carotid with 60% stenosis in the distal ICA. Bulky plaque at the bifurcation causes 70% stenosis when compared to the downstream ICA. No downstream beading. Heavily calcified ICA at the skull base. Vertebral arteries: Proximal atherosclerosis along the subclavians. No flow limiting stenosis in the subclavians. Strong right vertebral artery dominance without superimposed flow limiting stenosis.  Thready/intermittent flow in the proximal left vertebral artery with improved flow by the dura. Heavily calcified proximal left V4 segment atherosclerotic plaque with at least moderate stenosis. The distal left V4 segment and left PICA are patent. Skeleton: Advanced cervical disc degeneration with osteitis and reversal of  cervical lordosis. Other neck: Right maxillary sinusitis with complete opacification. Upper chest: Centrilobular emphysema. IMPRESSION: 1. Advanced atherosclerosis with right ICA stenosis measuring ~75% and left bifurcation stenosis measuring ~70%. 2. 60% distal left common carotid stenosis. 3. Thready flow in the proximal non-dominant left vertebral artery which is patent at the dura and basilar. Electronically Signed   By: Monte Fantasia M.D.   On: 09/08/2019 10:31   Ct Angio Chest Pe W Or Wo Contrast  Result Date: 09/07/2019 CLINICAL DATA:  Syncope. Found on floor at home last night. Right-sided chest pain. Possible right rib fracture. EXAM: CT ANGIOGRAPHY CHEST WITH CONTRAST TECHNIQUE: Multidetector CT imaging of the chest was performed using the standard protocol during bolus administration of intravenous contrast. Multiplanar CT image reconstructions and MIPs were obtained to evaluate the vascular anatomy. CONTRAST:  133mL OMNIPAQUE IOHEXOL 350 MG/ML SOLN COMPARISON:  None. FINDINGS: Cardiovascular: Heart is normal size. Mild calcified plaque over the left main, left anterior descending and lateral circumflex coronary arteries. Mild calcified plaque over the thoracic aorta. Thoracic aorta is otherwise normal in caliber. Pulmonary arterial system is well opacified and demonstrates no evidence of emboli. Remaining vascular structures are unremarkable. Mediastinum/Nodes: No mediastinal or hilar adenopathy. Remaining mediastinal structures are unremarkable. Lungs/Pleura: Lungs are adequately inflated with mild centrilobular emphysematous disease. Minimal lingular scarring. Tiny peripheral  calcified granuloma over the left apex. Minimal opacification over the left lower lobe likely atelectasis and less likely infection. Small amount of aspirate material over the dependent portion of the distal trachea. Remaining airways are unremarkable. Upper Abdomen: Minimal sludge versus cholelithiasis as the gallbladder is only partially visualized. Calcified plaque over the abdominal aorta. 3.7 cm partially visualized right adrenal adenoma. No acute findings. Musculoskeletal: Degenerative change of the spine. Review of the MIP images confirms the above findings. IMPRESSION: No evidence of pulmonary embolism. Mild right basilar opacification likely atelectasis and less likely infection. Minimal aspirate material over the dependent portion of the distal trachea. Minimal lingular scarring. Aortic Atherosclerosis (ICD10-I70.0) and Emphysema (ICD10-J43.9). Minimal atherosclerotic coronary artery disease. Mild cholelithiasis/sludge. 3.7 cm right adrenal adenoma partially visualized. Electronically Signed   By: Marin Olp M.D.   On: 09/07/2019 09:50   US Carotid Bilateral  Result Date: 09/07/2019 CLINICAL DATA:  Hypertension, syncope, hyperlipidemia EXAM: BILATERAL CAROTID DUPLEX ULTRASOUND TECHNIQUE: Pearline Cables scale imaging, color Doppler and duplex ultrasound were performed of bilateral carotid and vertebral arteries in the neck. COMPARISON:  None. FINDINGS: Criteria: Quantification of carotid stenosis is based on velocity parameters that correlate the residual internal carotid diameter with NASCET-based stenosis levels, using the diameter of the distal internal carotid lumen as the denominator for stenosis measurement. The following velocity measurements were obtained: RIGHT ICA: 425/69 cm/sec CCA: 0000000 cm/sec SYSTOLIC ICA/CCA RATIO:  3.6 ECA: 445 cm/sec LEFT ICA: 351/46 cm/sec CCA: [id number] cm/sec SYSTOLIC ICA/CCA RATIO:  2.6 ECA: 328 cm/sec RIGHT CAROTID ARTERY: Extensive carotid bifurcation atherosclerosis.  Marked velocity elevation in the proximal right ICA measuring 425/69 centimeters/second. Exam is difficult because of the degree of calcification. Right ICA stenosis estimated at greater than 70% by ultrasound criteria. RIGHT VERTEBRAL ARTERY:  Antegrade LEFT CAROTID ARTERY: Extensive left carotid bifurcation atherosclerosis. Degree of calcification limits the exam. Proximal left ICA velocity elevation measures 351/46 centimeters/second with turbulent flow noted. Left ICA stenosis also estimated greater than 70%. LEFT VERTEBRAL ARTERY:  Not visualized, likely occluded IMPRESSION: Extensive bilateral carotid atherosclerosis. Bilateral ICA stenoses estimated at greater than 70% by ultrasound criteria. Patent antegrade right vertebral artery. Suspect occluded left vertebral  artery. These results will be called to the ordering clinician or representative by the Radiologist Assistant, and communication documented in the PACS or zVision Dashboard. Electronically Signed   By: Jerilynn Mages.  Shick M.D.   On: 09/07/2019 14:55   Dg Chest Port 1 View  Result Date: 09/07/2019 CLINICAL DATA:  Wheezing and shortness of breath. History of COPD. EXAM: PORTABLE CHEST 1 VIEW COMPARISON:  None. FINDINGS: Heart is normal in size. Aortic atherosclerosis. Mild bilateral hilar prominence. There is central peribronchial thickening. Patchy bibasilar opacities. No pleural effusion, right costophrenic angle not entirely included in the field of view. No pneumothorax. IMPRESSION: 1. Central bronchial thickening. Patchy bibasilar opacities, atelectasis versus pneumonia. 2. Mild bilateral hilar prominence is likely related to overlapping vascular structures, but nonspecific. Aortic Atherosclerosis (ICD10-I70.0). Electronically Signed   By: Keith Rake M.D.   On: 09/07/2019 02:08   US Abdomen Limited Ruq  Result Date: 09/07/2019 CLINICAL DATA:  Right upper quadrant pain and mid abdominal pain. EXAM: ULTRASOUND ABDOMEN LIMITED RIGHT UPPER  QUADRANT COMPARISON:  Chest CT today. FINDINGS: Gallbladder: Mild to moderate cholelithiasis with borderline wall thickening of 3.3 mm. Largest stone measures 1 cm. Negative sonographic Murphy sign. No adjacent pericholecystic fluid. Common bile duct: Diameter: 3.6 mm. Liver: Moderate coarse increased parenchymal echogenicity without focal mass compatible with steatosis. Portal vein is patent on color Doppler imaging with normal direction of blood flow towards the liver. Other: No free fluid. IMPRESSION: Mild to moderate cholelithiasis with borderline wall thickening. No definitive findings for acute cholecystitis. Hepatic steatosis without focal mass. Electronically Signed   By: Marin Olp M.D.   On: 09/07/2019 10:51    Labs:  CBC: Recent Labs    09/07/19 0140 09/07/19 0747 09/08/19 0429  WBC 6.9 5.5 8.0  HGB 18.5* 18.4* 18.1*  HCT 55.1* 53.8* 53.1*  PLT 191 152 142*    COAGS: No results for input(s): INR, APTT in the last 8760 hours.  BMP: Recent Labs    09/07/19 0140 09/07/19 0747 09/08/19 0429  NA 139 140 139  K 4.1 3.9 3.9  CL 103 106 102  CO2 23 20* 27  GLUCOSE 141* 173* 215*  BUN 9 9 13   CALCIUM 8.4* 8.4* 8.9  CREATININE 0.72 0.60* 0.69  GFRNONAA >60 >60 >60  GFRAA >60 >60 >60    LIVER FUNCTION TESTS: Recent Labs    09/07/19 0140 09/07/19 0747 09/08/19 0429  BILITOT 1.0 1.1 1.6*  AST 56* 52* 41  ALT 64* 63* 52*  ALKPHOS 62 49 49  PROT 7.8 8.0 7.6  ALBUMIN 4.1 4.0 3.8    TUMOR MARKERS: No results for input(s): AFPTM, CEA, CA199, CHROMGRNA in the last 8760 hours.  Assessment and Plan:  Bilateral carotid artery stenosis on imaging - noted during recent admission for COPD exacerbation Scheduled now for Cerebral arteriogram Risks and benefits of cerebral angiogram with intervention were discussed with the patient including, but not limited to bleeding, infection, vascular injury, contrast induced renal failure, stroke or even death.  This  interventional procedure involves the use of X-rays and because of the nature of the planned procedure, it is possible that we will have prolonged use of X-ray fluoroscopy.  Potential radiation risks to you include (but are not limited to) the following: - A slightly elevated risk for cancer  several years later in life. This risk is typically less than 0.5% percent. This risk is low in comparison to the normal incidence of human cancer, which is 33% for women and 50%  for men according to the [employer]. - Radiation induced injury can include skin redness, resembling a rash, tissue breakdown / ulcers and hair loss (which can be temporary or permanent).   The likelihood of either of these occurring depends on the difficulty of the procedure and whether you are sensitive to radiation due to previous procedures, disease, or genetic conditions.   IF your procedure requires a prolonged use of radiation, you will be notified and given written instructions for further action.  It is your responsibility to monitor the irradiated area for the 2 weeks following the procedure and to notify your physician if you are concerned that you have suffered a radiation induced injury.    All of the patient's questions were answered, patient is agreeable to proceed.  Consent signed and in chart.  Thank you for this interesting consult.  I greatly enjoyed meeting Nathan Hart and look forward to participating in their care.  A copy of this report was sent to the requesting provider on this date.  Electronically Signed: Lavonia Drafts, PA-C 09/12/2019, 11:24 AM   I spent a total of  30 Minutes   in face to face in clinical consultation, greater than 50% of which was counseling/coordinating care for cerebral arteriogram

## 2019-09-13 LAB — CULTURE, BLOOD (ROUTINE X 2)
Culture: NO GROWTH
Culture: NO GROWTH
Special Requests: ADEQUATE
Special Requests: ADEQUATE

## 2019-09-15 ENCOUNTER — Encounter (HOSPITAL_COMMUNITY): Payer: Self-pay | Admitting: Interventional Radiology

## 2019-09-15 ENCOUNTER — Other Ambulatory Visit (HOSPITAL_COMMUNITY): Payer: Self-pay | Admitting: Interventional Radiology

## 2019-09-15 DIAGNOSIS — I771 Stricture of artery: Secondary | ICD-10-CM

## 2019-09-16 ENCOUNTER — Other Ambulatory Visit (HOSPITAL_COMMUNITY): Payer: Self-pay | Admitting: Interventional Radiology

## 2019-09-16 DIAGNOSIS — I771 Stricture of artery: Secondary | ICD-10-CM

## 2019-09-20 ENCOUNTER — Other Ambulatory Visit (HOSPITAL_COMMUNITY)
Admission: RE | Admit: 2019-09-20 | Discharge: 2019-09-20 | Disposition: A | Discharge status: Home / Self Care | Payer: Medicaid Other | Source: Ambulatory Visit | Attending: Interventional Radiology | Admitting: Interventional Radiology

## 2019-09-20 DIAGNOSIS — Z20828 Contact with and (suspected) exposure to other viral communicable diseases: Secondary | ICD-10-CM | POA: Insufficient documentation

## 2019-09-20 DIAGNOSIS — Z01812 Encounter for preprocedural laboratory examination: Secondary | ICD-10-CM | POA: Diagnosis present

## 2019-09-21 LAB — NOVEL CORONAVIRUS, NAA (HOSP ORDER, SEND-OUT TO REF LAB; TAT 18-24 HRS): SARS-CoV-2, NAA: NOT DETECTED

## 2019-09-23 ENCOUNTER — Encounter (HOSPITAL_COMMUNITY): Payer: Self-pay | Admitting: *Deleted

## 2019-09-23 ENCOUNTER — Other Ambulatory Visit: Payer: Self-pay

## 2019-09-23 ENCOUNTER — Other Ambulatory Visit: Payer: Self-pay | Admitting: Radiology

## 2019-09-23 NOTE — Progress Notes (Signed)
I called, what I thought was Nathan. Hart phone and his son answered. Patient's son, also Nathan Hart reported that he,patient's wife or patient's daughter in law usually answer the questions for hospital. "Nathan Hart, patient's daughter- in- law fixes patient's medications, she is at home with Dad now." I was given Nathan Hart's cell number and I called her and asked to speak to Nathan Hart- the patient and she allowed me too. Nathan Hart gave permission for hospital, Drs to speak to his son, Nathan Hart, patient's wife,Nathan Hart and patient's daughter-in- law Nathan Hart.  I askedpatient iof he cauld talk to me and update history and she did. I did speak with Nathan Hart regarding medications and instrustructions for AM. Nathan Hart denies chest pain or shortness of breath. Patient was tested for Covid and has been in quarantine since that time.

## 2019-09-23 NOTE — Anesthesia Preprocedure Evaluation (Addendum)
Anesthesia Evaluation  Patient identified by MRN, date of birth, ID band Patient awake    Reviewed: Allergy & Precautions, NPO status , Patient's Chart, lab work & pertinent test results  Airway Mallampati: II  TM Distance: >3 FB Neck ROM: Full    Dental  (+) Edentulous Upper, Dental Advisory Given   Pulmonary shortness of breath, COPD,  oxygen dependent, Current Smoker,    breath sounds clear to auscultation       Cardiovascular hypertension, Pt. on medications and Pt. on home beta blockers + Peripheral Vascular Disease   Rhythm:Regular Rate:Normal     Neuro/Psych CVA    GI/Hepatic negative GI ROS, (+)     substance abuse  alcohol use,   Endo/Other  negative endocrine ROS  Renal/GU negative Renal ROS     Musculoskeletal   Abdominal   Peds  Hematology negative hematology ROS (+)   Anesthesia Other Findings   Reproductive/Obstetrics                          Lab Results  Component Value Date   WBC 8.6 09/12/2019   HGB 17.8 (H) 09/12/2019   HCT 49.3 09/12/2019   MCV 97.8 09/12/2019   PLT 162 09/12/2019   Lab Results  Component Value Date   CREATININE 0.81 09/12/2019   BUN 15 09/12/2019   NA 137 09/12/2019   K 3.7 09/12/2019   CL 103 09/12/2019   CO2 26 09/12/2019    Anesthesia Physical Anesthesia Plan  ASA: III  Anesthesia Plan: General   Post-op Pain Management:    Induction: Intravenous  PONV Risk Score and Plan: 1 and Ondansetron, Dexamethasone and Treatment may vary due to age or medical condition  Airway Management Planned: Oral ETT  Additional Equipment: Arterial line  Intra-op Plan:   Post-operative Plan: Extubation in OR and Possible Post-op intubation/ventilation  Informed Consent: I have reviewed the patients History and Physical, chart, labs and discussed the procedure including the risks, benefits and alternatives for the proposed anesthesia with the  patient or authorized representative who has indicated his/her understanding and acceptance.     Dental advisory given  Plan Discussed with: CRNA  Anesthesia Plan Comments: (Hx of ETOH abuse, COPD -- admitted 09/07/2019 post unwitnessed fall in the setting of ongoing alcohol use. Per notes, pt drinks about 15 beers/day--- he had delirium tremens during admission, treated with benzos. Pt noted to have resp failure due to exacerbation of underlying COPD, he was discharged on 2L O2. Echo with EF of 60 to [id number] and diastolic dysfunction. Work up also included CT and [place] - US neck revealed Bilat Carotid stenosis. Findings were discussed with Dr Estanislado Pandy who recommended for OP Cerebral arteriogram. Arteriogram 09/12/19 showed Occluded Lt VA prox and approx 90% RT ICA stenosis prox, and 50 % stenosis of Lt CCA distally. Now scheduled for endovascular revascularization of the Right internal carotid artery.  TTE 09/07/19:  1. The left ventricle has normal systolic function with an ejection fraction of 60-65% (Contrast enhancement employed). The cavity size was normal. There is moderate concentric left ventricular hypertrophy. Left ventricular diastolic Doppler parameters  are consistent with impaired relaxation. No evidence of left ventricular regional wall motion abnormalities.  2. Left atrial size was not well visualized.  3. The mitral valve is grossly normal. There is mild mitral annular calcification present.  4. The tricuspid valve is grossly normal.  5. The aortic valve is tricuspid.  6. The aorta is normal  unless otherwise noted.  7. The interatrial septum was not well visualized.)      Anesthesia Quick Evaluation

## 2019-09-24 ENCOUNTER — Ambulatory Visit (HOSPITAL_COMMUNITY): Payer: Medicaid Other | Admitting: Certified Registered Nurse Anesthetist

## 2019-09-24 ENCOUNTER — Inpatient Hospital Stay (HOSPITAL_COMMUNITY)
Admission: RE | Admit: 2019-09-24 | Discharge: 2019-09-25 | DRG: 025 | Disposition: A | Discharge status: Home / Self Care | Payer: Medicaid Other | Attending: Interventional Radiology | Admitting: Interventional Radiology

## 2019-09-24 ENCOUNTER — Encounter (HOSPITAL_COMMUNITY): Payer: Self-pay

## 2019-09-24 ENCOUNTER — Encounter (HOSPITAL_COMMUNITY)
Admission: RE | Disposition: A | Discharge status: Home / Self Care | Payer: Self-pay | Source: Home / Self Care | Attending: Interventional Radiology

## 2019-09-24 ENCOUNTER — Ambulatory Visit (HOSPITAL_COMMUNITY)
Admission: RE | Admit: 2019-09-24 | Discharge: 2019-09-24 | Disposition: A | Discharge status: Home / Self Care | Payer: Medicaid Other | Source: Ambulatory Visit | Attending: Interventional Radiology | Admitting: Interventional Radiology

## 2019-09-24 ENCOUNTER — Other Ambulatory Visit: Payer: Self-pay

## 2019-09-24 DIAGNOSIS — Z7289 Other problems related to lifestyle: Secondary | ICD-10-CM | POA: Diagnosis not present

## 2019-09-24 DIAGNOSIS — Z7902 Long term (current) use of antithrombotics/antiplatelets: Secondary | ICD-10-CM

## 2019-09-24 DIAGNOSIS — J449 Chronic obstructive pulmonary disease, unspecified: Secondary | ICD-10-CM | POA: Diagnosis present

## 2019-09-24 DIAGNOSIS — G9389 Other specified disorders of brain: Secondary | ICD-10-CM | POA: Diagnosis present

## 2019-09-24 DIAGNOSIS — Z8673 Personal history of transient ischemic attack (TIA), and cerebral infarction without residual deficits: Secondary | ICD-10-CM | POA: Diagnosis not present

## 2019-09-24 DIAGNOSIS — I771 Stricture of artery: Secondary | ICD-10-CM

## 2019-09-24 DIAGNOSIS — Z7952 Long term (current) use of systemic steroids: Secondary | ICD-10-CM | POA: Diagnosis not present

## 2019-09-24 DIAGNOSIS — Z8701 Personal history of pneumonia (recurrent): Secondary | ICD-10-CM

## 2019-09-24 DIAGNOSIS — H5462 Unqualified visual loss, left eye, normal vision right eye: Secondary | ICD-10-CM | POA: Diagnosis present

## 2019-09-24 DIAGNOSIS — I739 Peripheral vascular disease, unspecified: Secondary | ICD-10-CM | POA: Diagnosis present

## 2019-09-24 DIAGNOSIS — Z7982 Long term (current) use of aspirin: Secondary | ICD-10-CM | POA: Diagnosis not present

## 2019-09-24 DIAGNOSIS — Z9981 Dependence on supplemental oxygen: Secondary | ICD-10-CM | POA: Diagnosis not present

## 2019-09-24 DIAGNOSIS — F17210 Nicotine dependence, cigarettes, uncomplicated: Secondary | ICD-10-CM | POA: Diagnosis present

## 2019-09-24 DIAGNOSIS — I10 Essential (primary) hypertension: Secondary | ICD-10-CM | POA: Diagnosis present

## 2019-09-24 DIAGNOSIS — I6521 Occlusion and stenosis of right carotid artery: Principal | ICD-10-CM | POA: Diagnosis present

## 2019-09-24 DIAGNOSIS — Z20828 Contact with and (suspected) exposure to other viral communicable diseases: Secondary | ICD-10-CM | POA: Diagnosis present

## 2019-09-24 DIAGNOSIS — H5461 Unqualified visual loss, right eye, normal vision left eye: Secondary | ICD-10-CM | POA: Diagnosis present

## 2019-09-24 DIAGNOSIS — J869 Pyothorax without fistula: Secondary | ICD-10-CM | POA: Diagnosis present

## 2019-09-24 DIAGNOSIS — I6529 Occlusion and stenosis of unspecified carotid artery: Secondary | ICD-10-CM | POA: Diagnosis present

## 2019-09-24 DIAGNOSIS — H3530 Unspecified macular degeneration: Secondary | ICD-10-CM | POA: Diagnosis present

## 2019-09-24 HISTORY — DX: Peripheral vascular disease, unspecified: I73.9

## 2019-09-24 HISTORY — DX: Dyspnea, unspecified: R06.00

## 2019-09-24 HISTORY — PX: RADIOLOGY WITH ANESTHESIA: SHX6223

## 2019-09-24 HISTORY — DX: Cerebral infarction, unspecified: I63.9

## 2019-09-24 HISTORY — PX: IR INTRAVSC STENT CERV CAROTID W/EMB-PROT MOD SED INCL ANGIO: IMG2303

## 2019-09-24 LAB — URINALYSIS, COMPLETE (UACMP) WITH MICROSCOPIC
Bacteria, UA: NONE SEEN
Bilirubin Urine: NEGATIVE
Glucose, UA: NEGATIVE mg/dL
Hgb urine dipstick: NEGATIVE
Ketones, ur: NEGATIVE mg/dL
Leukocytes,Ua: NEGATIVE
Nitrite: NEGATIVE
Protein, ur: NEGATIVE mg/dL
Specific Gravity, Urine: 1.016 (ref 1.005–1.030)
pH: 6 (ref 5.0–8.0)

## 2019-09-24 LAB — CBC WITH DIFFERENTIAL/PLATELET
Abs Immature Granulocytes: 0.03 10*3/uL (ref 0.00–0.07)
Basophils Absolute: 0.1 10*3/uL (ref 0.0–0.1)
Basophils Relative: 1 %
Eosinophils Absolute: 0.1 10*3/uL (ref 0.0–0.5)
Eosinophils Relative: 2 %
HCT: 49.9 % (ref 39.0–52.0)
Hemoglobin: 16.9 g/dL (ref 13.0–17.0)
Immature Granulocytes: 0 %
Lymphocytes Relative: 22 %
Lymphs Abs: 1.5 10*3/uL (ref 0.7–4.0)
MCH: 33.9 pg (ref 26.0–34.0)
MCHC: 33.9 g/dL (ref 30.0–36.0)
MCV: 100.2 fL — ABNORMAL HIGH (ref 80.0–100.0)
Monocytes Absolute: 0.5 10*3/uL (ref 0.1–1.0)
Monocytes Relative: 7 %
Neutro Abs: 4.6 10*3/uL (ref 1.7–7.7)
Neutrophils Relative %: 68 %
Platelets: 173 10*3/uL (ref 150–400)
RBC: 4.98 MIL/uL (ref 4.22–5.81)
RDW: 12.6 % (ref 11.5–15.5)
WBC: 6.9 10*3/uL (ref 4.0–10.5)
nRBC: 0 % (ref 0.0–0.2)

## 2019-09-24 LAB — BASIC METABOLIC PANEL
Anion gap: 10 (ref 5–15)
BUN: 7 mg/dL — ABNORMAL LOW (ref 8–23)
CO2: 19 mmol/L — ABNORMAL LOW (ref 22–32)
Calcium: 8.4 mg/dL — ABNORMAL LOW (ref 8.9–10.3)
Chloride: 108 mmol/L (ref 98–111)
Creatinine, Ser: 0.62 mg/dL (ref 0.61–1.24)
GFR calc Af Amer: >60 mL/min (ref >60)
GFR calc non Af Amer: >60 mL/min (ref >60)
Glucose, Bld: 134 mg/dL — ABNORMAL HIGH (ref 70–99)
Potassium: 3.6 mmol/L (ref 3.5–5.1)
Sodium: 137 mmol/L (ref 135–145)

## 2019-09-24 LAB — POCT ACTIVATED CLOTTING TIME
Activated Clotting Time: 191 seconds
Activated Clotting Time: 180 seconds

## 2019-09-24 LAB — MRSA PCR SCREENING: MRSA by PCR: NEGATIVE

## 2019-09-24 LAB — PROTIME-INR
INR: 1.1 (ref 0.8–1.2)
Prothrombin Time: 13.6 seconds (ref 11.4–15.2)

## 2019-09-24 LAB — HEPARIN LEVEL (UNFRACTIONATED): Heparin Unfractionated: <0.1 IU/mL — ABNORMAL LOW (ref 0.30–0.70)

## 2019-09-24 LAB — PLATELET INHIBITION P2Y12: Platelet Function  P2Y12: 8 [PRU] — ABNORMAL LOW (ref 182–335)

## 2019-09-24 SURGERY — IR WITH ANESTHESIA
Anesthesia: General

## 2019-09-24 MED ORDER — SODIUM CHLORIDE 0.9 % IV SOLN: INTRAVENOUS | Status: DC

## 2019-09-24 MED ORDER — FOLIC ACID 1 MG PO TABS: 1.0000 mg | ORAL_TABLET | Freq: Every day | ORAL | Status: DC

## 2019-09-24 MED ORDER — CHLORHEXIDINE GLUCONATE CLOTH 2 % EX PADS
6.0000 | MEDICATED_PAD | Freq: Every day | CUTANEOUS | Status: DC
  Administered 2019-09-24: 6 via TOPICAL

## 2019-09-24 MED ORDER — ACETAMINOPHEN 325 MG PO TABS
650.0000 mg | ORAL_TABLET | ORAL | Status: DC | PRN
  Administered 2019-09-25 (×2): 650 mg via ORAL
  Filled 2019-09-24 (×2): qty 2

## 2019-09-24 MED ORDER — IOHEXOL 300 MG/ML  SOLN
50.0000 mL | Freq: Once | INTRAMUSCULAR | Status: AC | PRN
  Administered 2019-09-24: 20 mL via INTRA_ARTERIAL

## 2019-09-24 MED ORDER — GLYCOPYRROLATE PF 0.2 MG/ML IJ SOSY
PREFILLED_SYRINGE | INTRAMUSCULAR | Status: DC | PRN
  Administered 2019-09-24 (×2): .1 mg via INTRAVENOUS

## 2019-09-24 MED ORDER — METHOCARBAMOL 750 MG PO TABS: 750.0000 mg | ORAL_TABLET | Freq: Three times a day (TID) | ORAL | Status: DC

## 2019-09-24 MED ORDER — FENTANYL CITRATE (PF) 250 MCG/5ML IJ SOLN
INTRAMUSCULAR | Status: DC | PRN
  Administered 2019-09-24 (×6): 25 ug via INTRAVENOUS

## 2019-09-24 MED ORDER — NITROGLYCERIN 1 MG/10 ML FOR IR/CATH LAB
INTRA_ARTERIAL | Status: AC
  Filled 2019-09-24: qty 10

## 2019-09-24 MED ORDER — ACETAMINOPHEN 160 MG/5ML PO SOLN: 650.0000 mg | ORAL | Status: DC | PRN

## 2019-09-24 MED ORDER — CLEVIDIPINE BUTYRATE 0.5 MG/ML IV EMUL: 0.0000 mg/h | INTRAVENOUS | Status: DC

## 2019-09-24 MED ORDER — ATROPINE SULFATE 0.4 MG/ML IV SOSY
PREFILLED_SYRINGE | INTRAVENOUS | Status: DC | PRN
  Administered 2019-09-24: .2 mg via INTRAVENOUS

## 2019-09-24 MED ORDER — ONDANSETRON HCL 4 MG/2ML IJ SOLN
INTRAMUSCULAR | Status: DC | PRN
  Administered 2019-09-24: 4 mg via INTRAVENOUS

## 2019-09-24 MED ORDER — HEPARIN (PORCINE) 25000 UT/250ML-% IV SOLN: 700.0000 [IU]/h | INTRAVENOUS | Status: DC

## 2019-09-24 MED ORDER — ASPIRIN 81 MG PO CHEW: 324.0000 mg | CHEWABLE_TABLET | Freq: Every day | ORAL | Status: DC

## 2019-09-24 MED ORDER — EPHEDRINE SULFATE-NACL 50-0.9 MG/10ML-% IV SOSY
PREFILLED_SYRINGE | INTRAVENOUS | Status: DC | PRN
  Administered 2019-09-24 (×5): 5 mg via INTRAVENOUS

## 2019-09-24 MED ORDER — ATORVASTATIN CALCIUM 40 MG PO TABS: 40.0000 mg | ORAL_TABLET | Freq: Every evening | ORAL | Status: DC

## 2019-09-24 MED ORDER — HEPARIN (PORCINE) 25000 UT/250ML-% IV SOLN
950.0000 [IU]/h | INTRAVENOUS | Status: DC
  Administered 2019-09-24: 14:00:00 700 [IU]/h via INTRAVENOUS

## 2019-09-24 MED ORDER — SODIUM CHLORIDE 0.9 % IV SOLN
INTRAVENOUS | Status: DC
  Administered 2019-09-24: 15:00:00 via INTRAVENOUS

## 2019-09-24 MED ORDER — HEPARIN SODIUM (PORCINE) 1000 UNIT/ML IJ SOLN
INTRAMUSCULAR | Status: DC | PRN
  Administered 2019-09-24: 3000 [IU] via INTRAVENOUS

## 2019-09-24 MED ORDER — ATENOLOL 50 MG PO TABS: 50.0000 mg | ORAL_TABLET | Freq: Every day | ORAL | Status: DC

## 2019-09-24 MED ORDER — ASPIRIN EC 325 MG PO TBEC
325.0000 mg | DELAYED_RELEASE_TABLET | ORAL | Status: DC
  Filled 2019-09-24: qty 1

## 2019-09-24 MED ORDER — MIDAZOLAM HCL 5 MG/5ML IJ SOLN
INTRAMUSCULAR | Status: DC | PRN
  Administered 2019-09-24 (×4): 0.5 mg via INTRAVENOUS

## 2019-09-24 MED ORDER — EPTIFIBATIDE 20 MG/10ML IV SOLN
INTRAVENOUS | Status: AC
  Filled 2019-09-24: qty 10

## 2019-09-24 MED ORDER — THIAMINE HCL 100 MG PO TABS: 100.0000 mg | ORAL_TABLET | Freq: Every day | ORAL | Status: DC

## 2019-09-24 MED ORDER — IOHEXOL 300 MG/ML  SOLN
150.0000 mL | Freq: Once | INTRAMUSCULAR | Status: AC | PRN
  Administered 2019-09-24: 75 mL via INTRA_ARTERIAL

## 2019-09-24 MED ORDER — ACETAMINOPHEN 650 MG RE SUPP: 650.0000 mg | RECTAL | Status: DC | PRN

## 2019-09-24 MED ORDER — AMLODIPINE BESYLATE 10 MG PO TABS: 10.0000 mg | ORAL_TABLET | Freq: Every day | ORAL | Status: DC

## 2019-09-24 MED ORDER — CEFAZOLIN SODIUM-DEXTROSE 2-4 GM/100ML-% IV SOLN
2.0000 g | INTRAVENOUS | Status: AC
  Administered 2019-09-24: 09:00:00 2 g via INTRAVENOUS
  Filled 2019-09-24: qty 100

## 2019-09-24 MED ORDER — CLOPIDOGREL BISULFATE 75 MG PO TABS
75.0000 mg | ORAL_TABLET | ORAL | Status: DC
  Filled 2019-09-24: qty 1

## 2019-09-24 MED ORDER — ALBUTEROL SULFATE (2.5 MG/3ML) 0.083% IN NEBU: 2.5000 mg | INHALATION_SOLUTION | Freq: Four times a day (QID) | RESPIRATORY_TRACT | Status: DC | PRN

## 2019-09-24 MED ORDER — LACTATED RINGERS IV SOLN
INTRAVENOUS | Status: DC | PRN
  Administered 2019-09-24: 09:00:00 via INTRAVENOUS

## 2019-09-24 MED ORDER — ACETAMINOPHEN 325 MG PO TABS: 650.0000 mg | ORAL_TABLET | Freq: Four times a day (QID) | ORAL | Status: DC | PRN

## 2019-09-24 MED ORDER — NIMODIPINE 30 MG PO CAPS: 0.0000 mg | ORAL_CAPSULE | ORAL | Status: DC

## 2019-09-24 MED ORDER — ASPIRIN 325 MG PO TABS
325.0000 mg | ORAL_TABLET | Freq: Every day | ORAL | Status: DC
  Administered 2019-09-25: 325 mg via ORAL
  Filled 2019-09-24: qty 1

## 2019-09-24 MED ORDER — HEPARIN (PORCINE) 25000 UT/250ML-% IV SOLN
500.0000 [IU]/h | INTRAVENOUS | Status: DC
  Administered 2019-09-24: 500 [IU]/h via INTRAVENOUS

## 2019-09-24 MED ORDER — CLOPIDOGREL BISULFATE 75 MG PO TABS
75.0000 mg | ORAL_TABLET | Freq: Every day | ORAL | Status: DC
  Administered 2019-09-25: 10:00:00 75 mg via ORAL
  Filled 2019-09-24: qty 1

## 2019-09-24 MED ORDER — CLOPIDOGREL BISULFATE 75 MG PO TABS: 75.0000 mg | ORAL_TABLET | Freq: Every day | ORAL | Status: DC

## 2019-09-24 MED ORDER — HEPARIN (PORCINE) 25000 UT/250ML-% IV SOLN
INTRAVENOUS | Status: AC
  Filled 2019-09-24: qty 250

## 2019-09-24 MED ORDER — LIDOCAINE HCL 1 % IJ SOLN
INTRAMUSCULAR | Status: AC
  Filled 2019-09-24: qty 20

## 2019-09-24 NOTE — Anesthesia Procedure Notes (Signed)
Arterial Line Insertion Start/End9/30/2020 9:00 AM, 09/24/2019 9:06 AM Performed by: Harden Mo, CRNA, CRNA  Patient location: Pre-op. Preanesthetic checklist: patient identified, IV checked, site marked, risks and benefits discussed, surgical consent, monitors and equipment checked, pre-op evaluation and anesthesia consent Lidocaine 1% used for infiltration Left, radial was placed Catheter size: 20 G Hand hygiene performed  and maximum sterile barriers used  Allen's test indicative of satisfactory collateral circulation Attempts: 1 Procedure performed without using ultrasound guided technique. Ultrasound Notes:anatomy identified, needle tip was noted to be adjacent to the nerve/plexus identified and no ultrasound evidence of intravascular and/or intraneural injection Following insertion, dressing applied and Biopatch. Post procedure assessment: normal  Patient tolerated the procedure well with no immediate complications.

## 2019-09-24 NOTE — Transfer of Care (Signed)
Immediate Anesthesia Transfer of Care Note  Patient: MAKAEL STEIN  Procedure(s) Performed: IR WITH ANESTHESIA/STENTING (N/A )  Patient Location: PACU  Anesthesia Type:MAC  Level of Consciousness: awake, alert  and oriented  Airway & Oxygen Therapy: Patient Spontanous Breathing and Patient connected to nasal cannula oxygen  Post-op Assessment: Report given to RN, Post -op Vital signs reviewed and stable and Patient moving all extremities X 4  Post vital signs: Reviewed and stable  Last Vitals:  Vitals Value Taken Time  BP 162/132 09/24/19 1147  Temp    Pulse 62 09/24/19 1148  Resp 20 09/24/19 1148  SpO2 96 % 09/24/19 1148  Vitals shown include unvalidated device data.  Last Pain:  Vitals:   09/24/19 0656  TempSrc:   PainSc: 5       Patients Stated Pain Goal: 3 (53/00/51 1021)  Complications: No apparent anesthesia complications

## 2019-09-24 NOTE — Progress Notes (Signed)
ANTICOAGULATION CONSULT NOTE - Initial Consult  Pharmacy Consult for Heparin Indication: Post Interventional Neuroradiology Procedure  No Known Allergies  Patient Measurements: Height: 5\' 10"  (177.8 cm) Weight: 175 lb (79.4 kg) IBW/kg (Calculated) : 73 Heparin Dosing Weight: 79.4 kg  Vital Signs: Temp: 99.5 F (37.5 C) (09/30 1300) Temp Source: Oral (09/30 0619) BP: 110/55 (09/30 1315) Pulse Rate: 49 (09/30 1300)  Labs: Recent Labs    09/24/19 0623  HGB 16.9  HCT 49.9  PLT 173  LABPROT 13.6  INR 1.1  CREATININE 0.62    Estimated Creatinine Clearance: 97.6 mL/min (by C-G formula based on SCr of 0.62 mg/dL).   Medical History: Past Medical History:  Diagnosis Date  . Abnormal peripheral vision of left eye   . Blind right eye   . COPD (chronic obstructive pulmonary disease) (HCC)     home o2 hs and prn  . Dyspnea    with activity  . Empyema lung ([place])   . ETOH abuse   . HTN (hypertension)   . Peripheral vascular disease ([place])   . Pneumonia 2020  . Stroke [facility])     Medications:  Infusions:  . sodium chloride    . clevidipine    . heparin    . heparin      Assessment: 52 YOM with right ICA stenosis who is now s/p endovascular revascularization (angioplasty/stent placement) of right ICA stenosis. No anticoagulation PTA. Pharmacy consulted to dose heparin.  Patient was started on heparin 500 units/hr post-procedure. CBC this AM WNL. Given indication, will increase the dose per protocol, target a lower level, and not give a bolus dose.  Goal of Therapy:  0.1-0.25 Monitor platelets by anticoagulation protocol: Yes   Plan:  Increase Heparin IV to 700 units/hr 6-hr heparin level Daily HL and CBC Monitor bleeding  Richardine Service 09/24/2019,1:57 PM

## 2019-09-24 NOTE — Procedures (Signed)
S/P RT  Common carotid arteriogram followed by revascularization of severe  prox RT ICA stenosis with stent assisted angioplasty with distal protection . S.Cherica Heiden MD

## 2019-09-24 NOTE — Sedation Documentation (Signed)
Right groin sheath removed, quick clot closure device used.

## 2019-09-24 NOTE — Progress Notes (Signed)
Patient ID: Nathan Hart, male   DOB: [birth year], 63 y.o.   MRN: [id number] INR. Post procedure. Clinically stable. Denies any H/As,N/V or changes in his vision. AAOx 3 speech clear unchanged. No facial asymmetry. Tongue midline. Motor. No drift. RT groin soft.. Distal pulses.palpable. Arlean Hopping MD

## 2019-09-24 NOTE — Anesthesia Postprocedure Evaluation (Signed)
Anesthesia Post Note  Patient: Nathan Hart  Procedure(s) Performed: IR WITH ANESTHESIA/STENTING (N/A )     Patient location during evaluation: PACU Anesthesia Type: MAC Level of consciousness: awake and alert Pain management: pain level controlled Vital Signs Assessment: post-procedure vital signs reviewed and stable Respiratory status: spontaneous breathing, nonlabored ventilation, respiratory function stable and patient connected to nasal cannula oxygen Cardiovascular status: stable and blood pressure returned to baseline Postop Assessment: no apparent nausea or vomiting Anesthetic complications: no    Last Vitals:  Vitals:   09/24/19 1300 09/24/19 1315  BP:  (!) 110/55  Pulse: (!) 49   Resp: (!) 21   Temp: 37.5 C   SpO2: 96%     Last Pain:  Vitals:   09/24/19 1315  TempSrc:   PainSc: 0-No pain                 Tiajuana Amass

## 2019-09-24 NOTE — Progress Notes (Signed)
NIR.  Patient underwent an image-guided cerebral arteriogram with endovascular revascularization of proximal right ICA severe pre-occlusive stenosis using stent assisted angioplasty this AM by Dr. Estanislado Pandy.  Saw patient in neuro ICU alongside Dr. Estanislado Pandy. Patient awake and alert laying in bed with no complaints at this time. Denies headache, weakness, numbness/tingling, dizziness, vision changes, hearing changes, tinnitus, or speech difficulty.  Alert, awake, and oriented x3. Speech and comprehension intact. PERRL bilaterally. No facial asymmetry. Tongue midline. Motor power symmetric proportional to effort.No No pronator drift. Fine motor and coordination intact and symmetric. Distal pulses- left DP 2+, right DP palpable with doppler, bilateral PTs 1+ (no change from prior to procedure). Right groin incision soft without active bleeding or hematoma.  Plan to stay in neuro ICU for overnight observation. BP goal = 0000000 systolic. Can raise [place] to remove neck brace 0800 09/25/2019. Advance diet as tolerated. Continue taking Plavix 75 mg once daily and Aspirin 325 mg once daily. Continue IV continuous Heparin. Other plans per Dr. Estanislado Pandy. NIR to follow.   Bea Graff Ameliarose Shark, PA-C 09/24/2019, 4:03 PM

## 2019-09-24 NOTE — Progress Notes (Signed)
ANTICOAGULATION CONSULT NOTE - Initial Consult  Pharmacy Consult for Heparin Indication: Post Interventional Neuroradiology Procedure  No Known Allergies  Patient Measurements: Height: 5\' 10"  (177.8 cm) Weight: 178 lb 5.6 oz (80.9 kg) IBW/kg (Calculated) : 73 Heparin Dosing Weight: 79.4 kg  Vital Signs: Temp: 98.1 F (36.7 C) (09/30 2000) Temp Source: Oral (09/30 2000) BP: 98/54 (09/30 1900) Pulse Rate: 45 (09/30 2000)  Labs: Recent Labs    09/24/19 0623 09/24/19 2030  HGB 16.9  --   HCT 49.9  --   PLT 173  --   LABPROT 13.6  --   INR 1.1  --   HEPARINUNFRC  --  <0.10*  CREATININE 0.62  --     Estimated Creatinine Clearance: 97.6 mL/min (by C-G formula based on SCr of 0.62 mg/dL).  Assessment: 51 YOM with right ICA stenosis who is now s/p endovascular revascularization (angioplasty/stent placement) of right ICA stenosis. No anticoagulation pta  Initial hep lvl undetectable  Goal of Therapy:  0.1-0.25 Monitor platelets by anticoagulation protocol: Yes   Plan:  Increase Heparin IV to 850 units/hr Daily HL cbc F/U stop at 0700 per protocol?  Levester Fresh, PharmD, BCPS, BCCCP Clinical Pharmacist [phone]  Please check AMION for all [place] numbers  09/24/2019 9:26 PM

## 2019-09-24 NOTE — Anesthesia Procedure Notes (Signed)
Procedure Name: MAC Date/Time: 09/24/2019 9:20 AM Performed by: Harden Mo, CRNA Pre-anesthesia Checklist: Patient identified, Emergency Drugs available, Suction available and Patient being monitored Patient Re-evaluated:Patient Re-evaluated prior to induction Oxygen Delivery Method: Nasal cannula Preoxygenation: Pre-oxygenation with 100% oxygen Induction Type: IV induction Placement Confirmation: positive ETCO2 and breath sounds checked- equal and bilateral Dental Injury: Teeth and Oropharynx as per pre-operative assessment

## 2019-09-24 NOTE — H&P (Addendum)
Chief Complaint: Patient was seen in consultation today for proximal right ICA stenosis/cerebral arteriogram with possible endovascular revascularization.  Referring Physician(s): Denton Brick, Courage  Supervising Physician: Luanne Bras  Patient Status: [facility] - Out-pt  History of Present Illness: Nathan Hart is a 63 y.o. 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 He is know to [facility] and has been followed by Dr. Estanislado Pandy since 9/21020. He first presented to our department at the request of Dr. Denton Brick for management of bilateral ICA stenosis seen on CTA. He underwent an image-guided diagnostic cerebral arteriogram 09/12/2019 by Dr. Estanislado Pandy. At that time, it was recommended that patient undergo endovascular revascularization for his right ICA stenosis to prevent future CVA.  Diagnostic cerebral arteriogram 09/12/2019: 1. Approximately 90% stenosis of the right internal carotid artery proximally at the bulb by the NASCET criteria.  2. Occluded left vertebral artery proximally with distal reconstitution  of the left vertebrobasilar junction to the left posterior-inferior cerebellar artery from the right vertebral artery retrogradely.  3. Approximately 50% stenosis of the left common carotid artery proximal to the bifurcation, and of approximately 50% stenosis of the left internal carotid artery in the horizontal petrous segment secondary to a circumferential atherosclerotic plaque.   Patient presents today for possible endovascular revascularization (angioplasty/stent placement) of right ICA stenosis. Patient awake and alert laying in bed with no complaints at this time. Denies fever, chills, chest pain, dyspnea, abdominal pain, headache, weakness, numbness/tingling, dizziness, vision changes, hearing changes, tinnitus, or  speech difficulty.  Currently taking Plavix 75 mg once daily and Aspirin 81 mg once daily.   Past Medical History:  Diagnosis Date   Abnormal peripheral vision of left eye    Blind right eye    COPD (chronic obstructive pulmonary disease) (HCC)     home o2 hs and prn   Dyspnea    with activity   Empyema lung (HCC)    ETOH abuse    HTN (hypertension)    Peripheral vascular disease ([employer])    Pneumonia 2020   Stroke [facility])     Past Surgical History:  Procedure Laterality Date   BACK SURGERY     disc lumar x 2    CLAVICLE SURGERY Right    IR ANGIO INTRA EXTRACRAN SEL COM CAROTID INNOMINATE BILAT MOD SED  09/12/2019   IR ANGIO VERTEBRAL SEL SUBCLAVIAN INNOMINATE UNI L MOD SED  09/12/2019   IR ANGIO VERTEBRAL SEL VERTEBRAL UNI R MOD SED  09/12/2019   IR US GUIDE VASC ACCESS RIGHT  09/12/2019   KNEE SURGERY Right     x 3 ACL   TALC PLEURODESIS     [place], [place]    Allergies: Patient has no known allergies.  Medications: Prior to Admission medications   Medication Sig Start Date End Date Taking? Authorizing Provider  amLODipine (NORVASC) 10 MG tablet Take 1 tablet (10 mg total) by mouth daily. For BP 09/10/19  Yes Emokpae, Courage, MD  aspirin EC 81 MG tablet Take 1 tablet (81 mg total) by mouth daily with breakfast. 09/09/19 09/08/20 Yes Emokpae, Courage, MD  atenolol (TENORMIN) 50 MG tablet Take 1 tablet (50 mg total) by mouth daily. For BP 09/10/19  Yes Emokpae, Courage, MD  atorvastatin (LIPITOR) 40 MG tablet Take 1 tablet (40 mg total) by mouth every evening. For Cholesterol 09/09/19  Yes Roxan Hockey, MD  clopidogrel (PLAVIX) 75  MG tablet Take 75 mg by mouth daily.   Yes [provider]  folic acid (FOLVITE) 1 MG tablet Take 1 tablet (1 mg total) by mouth daily. 09/10/19  Yes Emokpae, Courage, MD  methocarbamol (ROBAXIN) 750 MG tablet Take 1 tablet (750 mg total) by mouth 3 (three) times daily. For muscle pain 09/09/19  Yes Emokpae, Courage, MD    thiamine 100 MG tablet Take 1 tablet (100 mg total) by mouth daily. 09/10/19  Yes Roxan Hockey, MD  acetaminophen (TYLENOL) 325 MG tablet Take 650 mg by mouth every 6 (six) hours as needed for moderate pain or headache.    [provider]  albuterol (PROVENTIL) (2.5 MG/3ML) 0.083% nebulizer solution Take 3 mLs (2.5 mg total) by nebulization every 6 (six) hours as needed for wheezing or shortness of breath. For breathing 09/09/19   Roxan Hockey, MD  nicotine (NICODERM CQ - DOSED IN MG/24 HOURS) 21 mg/24hr patch Place 1 patch (21 mg total) onto the skin daily. Patient not taking: Reported on 09/22/2019 09/09/19   Roxan Hockey, MD  polyethylene glycol (MIRALAX / GLYCOLAX) 17 g packet Take 17 g by mouth daily. For bowel Patient not taking: Reported on 09/22/2019 09/10/19   Roxan Hockey, MD  predniSONE (DELTASONE) 20 MG tablet Take 2 tablets (40 mg total) by mouth daily with breakfast. For lungs Patient not taking: Reported on 09/22/2019 09/09/19   Roxan Hockey, MD  umeclidinium bromide (INCRUSE ELLIPTA) 62.5 MCG/INH AEPB Inhale 1 puff into the lungs daily. For breathing 09/10/19   Roxan Hockey, MD     Family History  Problem Relation Age of Onset   Cancer Mother     Social History   Socioeconomic History   Marital status: Legally Separated    Spouse name: Not on file   Number of children: Not on file   Years of education: Not on file   Highest education level: Not on file  Occupational History   Not on file  Social Needs   Financial resource strain: Not on file   Food insecurity    Worry: Not on file    Inability: Not on file   Transportation needs    Medical: Not on file    Non-medical: Not on file  Tobacco Use   Smoking status: Current Every Day Smoker    Packs/day: 1.50    Years: 9.00    Pack years: 13.50    Types: Cigarettes   Smokeless tobacco: Never Used  Substance and Sexual Activity   Alcohol use: Yes    Alcohol/week: 20.0 standard  drinks    Types: 20 Cans of beer per week   Drug use: Never   Sexual activity: Not on file  Lifestyle   Physical activity    Days per week: Not on file    Minutes per session: Not on file   Stress: Not on file  Relationships   Social connections    Talks on phone: Not on file    Gets together: Not on file    Attends religious service: Not on file    Active member of club or organization: Not on file    Attends meetings of clubs or organizations: Not on file    Relationship status: Not on file  Other Topics Concern   Not on file  Social History Narrative   Not on file     Review of Systems: A 12 point ROS discussed and pertinent positives are indicated in the HPI above.  All other systems  are negative.  Review of Systems  Constitutional: Negative for chills and fever.  HENT: Negative for hearing loss and tinnitus.   Eyes: Negative for visual disturbance.  Respiratory: Negative for shortness of breath and wheezing.   Cardiovascular: Negative for chest pain and palpitations.  Gastrointestinal: Negative for abdominal pain.  Neurological: Negative for dizziness, speech difficulty, weakness, numbness and headaches.  Psychiatric/Behavioral: Negative for behavioral problems and confusion.    Vital Signs: BP (!) 169/65    Pulse 65    Temp 97.7 F (36.5 C) (Oral)    Resp 18    Ht 5\' 10"  (1.778 m)    Wt 175 lb (79.4 kg)    SpO2 100%    BMI 25.11 kg/m   Physical Exam Vitals signs and nursing note reviewed.  Constitutional:      General: He is not in acute distress.    Appearance: Normal appearance.  Cardiovascular:     Rate and Rhythm: Normal rate and regular rhythm.     Heart sounds: Normal heart sounds. No murmur.  Pulmonary:     Effort: Pulmonary effort is normal. No respiratory distress.     Breath sounds: Normal breath sounds. No wheezing.  Skin:    General: Skin is warm and dry.  Neurological:     Mental Status: He is alert and oriented to person, place, and  time.  Psychiatric:        Mood and Affect: Mood normal.        Behavior: Behavior normal.        Thought Content: Thought content normal.        Judgment: Judgment normal.      MD Evaluation Airway: WNL Heart: WNL Abdomen: WNL Chest/ Lungs: WNL ASA  Classification: 3 Mallampati/Airway Score: Two   Imaging: Ct Head Wo Contrast  Result Date: 09/07/2019 CLINICAL DATA:  Found on the floor last night. EXAM: CT HEAD WITHOUT CONTRAST TECHNIQUE: Contiguous axial images were obtained from the base of the skull through the vertex without intravenous contrast. COMPARISON:  None. FINDINGS: Brain: No focal abnormality is seen affecting the brainstem or cerebellum. Right cerebral hemisphere shows an old right frontoparietal cortical and subcortical infarction consistent with MCA branch vessel territory infarction, progressed to atrophy, encephalomalacia and gliosis. There is low-density in the left basal ganglia which probably represents an old stroke, though the possibility of subacute infarction in that region is not completely excluded. No evidence of mass effect or hemorrhage. No hydrocephalus or extra-axial collection. Vascular: There is atherosclerotic calcification of the major vessels at the base of the brain. Skull: Negative Sinuses/Orbits: Chronic opacification of the right maxillary sinus. Other sinuses clear. Other: None IMPRESSION: No definite acute finding by CT. 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 Probably old left basal ganglia infarction. There is some potential there could be subacute infarction in the left basal ganglia. No hemorrhage or mass effect. Chronic inflammatory changes of the right maxillary sinus. Electronically Signed   By: Nelson Chimes M.D.   On: 09/07/2019 09:39   Ct Angio Neck W Or Wo Contrast  Result Date: 09/08/2019 CLINICAL DATA:  Syncope.  Bilateral ICA stenosis by ultrasound EXAM: CT ANGIOGRAPHY NECK TECHNIQUE: Multidetector CT imaging of the  neck was performed using the standard protocol during bolus administration of intravenous contrast. Multiplanar CT image reconstructions and MIPs were obtained to evaluate the vascular anatomy. Carotid stenosis measurements (when applicable) are obtained utilizing NASCET criteria, using the distal internal carotid diameter as the denominator. CONTRAST:  159mL OMNIPAQUE  IOHEXOL 350 MG/ML SOLN COMPARISON:  None. FINDINGS: Aortic arch: Atherosclerotic plaque.  Three vessel branching. Right carotid system: Diffuse atheromatous wall thickening of the common carotid artery with particularly bulky plaque about the bifurcation. Proximal ICA stenosis measures 75%. Left carotid system: Extensive atherosclerotic plaque on the common carotid with 60% stenosis in the distal ICA. Bulky plaque at the bifurcation causes 70% stenosis when compared to the downstream ICA. No downstream beading. Heavily calcified ICA at the skull base. Vertebral arteries: Proximal atherosclerosis along the subclavians. No flow limiting stenosis in the subclavians. Strong right vertebral artery dominance without superimposed flow limiting stenosis. Thready/intermittent flow in the proximal left vertebral artery with improved flow by the dura. Heavily calcified proximal left V4 segment atherosclerotic plaque with at least moderate stenosis. The distal left V4 segment and left PICA are patent. Skeleton: Advanced cervical disc degeneration with osteitis and reversal of cervical lordosis. Other neck: Right maxillary sinusitis with complete opacification. Upper chest: Centrilobular emphysema. IMPRESSION: 1. Advanced atherosclerosis with right ICA stenosis measuring ~75% and left bifurcation stenosis measuring ~70%. 2. 60% distal left common carotid stenosis. 3. Thready flow in the proximal non-dominant left vertebral artery which is patent at the dura and basilar. Electronically Signed   By: Monte Fantasia M.D.   On: 09/08/2019 10:31   Ct Angio Chest Pe W  Or Wo Contrast  Result Date: 09/07/2019 CLINICAL DATA:  Syncope. Found on floor at home last night. Right-sided chest pain. Possible right rib fracture. EXAM: CT ANGIOGRAPHY CHEST WITH CONTRAST TECHNIQUE: Multidetector CT imaging of the chest was performed using the standard protocol during bolus administration of intravenous contrast. Multiplanar CT image reconstructions and MIPs were obtained to evaluate the vascular anatomy. CONTRAST:  185mL OMNIPAQUE IOHEXOL 350 MG/ML SOLN COMPARISON:  None. FINDINGS: Cardiovascular: Heart is normal size. Mild calcified plaque over the left main, left anterior descending and lateral circumflex coronary arteries. Mild calcified plaque over the thoracic aorta. Thoracic aorta is otherwise normal in caliber. Pulmonary arterial system is well opacified and demonstrates no evidence of emboli. Remaining vascular structures are unremarkable. Mediastinum/Nodes: No mediastinal or hilar adenopathy. Remaining mediastinal structures are unremarkable. Lungs/Pleura: Lungs are adequately inflated with mild centrilobular emphysematous disease. Minimal lingular scarring. Tiny peripheral calcified granuloma over the left apex. Minimal opacification over the left lower lobe likely atelectasis and less likely infection. Small amount of aspirate material over the dependent portion of the distal trachea. Remaining airways are unremarkable. Upper Abdomen: Minimal sludge versus cholelithiasis as the gallbladder is only partially visualized. Calcified plaque over the abdominal aorta. 3.7 cm partially visualized right adrenal adenoma. No acute findings. Musculoskeletal: Degenerative change of the spine. Review of the MIP images confirms the above findings. IMPRESSION: No evidence of pulmonary embolism. Mild right basilar opacification likely atelectasis and less likely infection. Minimal aspirate material over the dependent portion of the distal trachea. Minimal lingular scarring. Aortic Atherosclerosis  (ICD10-I70.0) and Emphysema (ICD10-J43.9). Minimal atherosclerotic coronary artery disease. Mild cholelithiasis/sludge. 3.7 cm right adrenal adenoma partially visualized. Electronically Signed   By: Marin Olp M.D.   On: 09/07/2019 09:50   US Carotid Bilateral  Result Date: 09/07/2019 CLINICAL DATA:  Hypertension, syncope, hyperlipidemia EXAM: BILATERAL CAROTID DUPLEX ULTRASOUND TECHNIQUE: Pearline Cables scale imaging, color Doppler and duplex ultrasound were performed of bilateral carotid and vertebral arteries in the neck. COMPARISON:  None. FINDINGS: Criteria: Quantification of carotid stenosis is based on velocity parameters that correlate the residual internal carotid diameter with NASCET-based stenosis levels, using the diameter of the distal internal carotid lumen  as the denominator for stenosis measurement. The following velocity measurements were obtained: RIGHT ICA: 425/69 cm/sec CCA: 0000000 cm/sec SYSTOLIC ICA/CCA RATIO:  3.6 ECA: 445 cm/sec LEFT ICA: 351/46 cm/sec CCA: [id number] cm/sec SYSTOLIC ICA/CCA RATIO:  2.6 ECA: 328 cm/sec RIGHT CAROTID ARTERY: Extensive carotid bifurcation atherosclerosis. Marked velocity elevation in the proximal right ICA measuring 425/69 centimeters/second. Exam is difficult because of the degree of calcification. Right ICA stenosis estimated at greater than 70% by ultrasound criteria. RIGHT VERTEBRAL ARTERY:  Antegrade LEFT CAROTID ARTERY: Extensive left carotid bifurcation atherosclerosis. Degree of calcification limits the exam. Proximal left ICA velocity elevation measures 351/46 centimeters/second with turbulent flow noted. Left ICA stenosis also estimated greater than 70%. LEFT VERTEBRAL ARTERY:  Not visualized, likely occluded IMPRESSION: Extensive bilateral carotid atherosclerosis. Bilateral ICA stenoses estimated at greater than 70% by ultrasound criteria. Patent antegrade right vertebral artery. 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 These results will be called to the  ordering clinician or representative by the Radiologist Assistant, and communication documented in the PACS or zVision Dashboard. Electronically Signed   By: Jerilynn Mages.  Shick M.D.   On: 09/07/2019 14:55   Ir US Guide Vasc Access Right  Result Date: 09/15/2019 CLINICAL DATA:  Recent history of syncope. 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 Abnormal ultrasound of the carotids indicative of bilateral internal carotid artery significant stenosis. EXAM: BILATERAL COMMON CAROTID AND INNOMINATE ANGIOGRAPHY; IR ANGIO VERTEBRAL SEL VERTEBRAL UNI RIGHT MOD SED; IR ULTRASOUND GUIDANCE VASC ACCESS RIGHT COMPARISON:  CT angiogram of the head and neck of September 08, 2019. Ultrasound of the carotids of September 07, 2019. MEDICATIONS: Heparin 2000 units intra-arterial. No antibiotic was administered within 1 hour of the procedure. ANESTHESIA/SEDATION: Versed 1 mg IV; Fentanyl 25 mcg IV Moderate Sedation Time:  44 minutes The patient was continuously monitored during the procedure by the interventional radiology nurse under my direct supervision. CONTRAST:  Isovue 300 approximately 60 mL. FLUOROSCOPY TIME:  Fluoroscopy Time: 18 minutes 6 seconds (791 mGy). COMPLICATIONS: None immediate. TECHNIQUE: Informed written consent was obtained from the patient after a thorough discussion of the procedural risks, benefits and alternatives. All questions were addressed. Maximal Sterile Barrier Technique was utilized including caps, mask, sterile gowns, sterile gloves, sterile drape, hand hygiene and skin antiseptic. A timeout was performed prior to the initiation of the procedure. The right forearm to the wrist was prepped and draped in the usual sterile fashion. The right radial artery was then palpated. A dorsal palmar anastomosis was then verified successfully. Right radial artery was then interrogated with ultrasound and its morphology documented with ultrasound  imaging. Using ultrasound guidance, transradial access into the right radial artery was obtained using micropuncture set over a 0.018 inch micro guidewire. Over the micro guidewire, a 4/5 French radial sheath was then inserted without difficulty. The obturator and the guidewire were removed. Good aspiration was obtained from the side port of radial sheath. A diluted cocktail of 2.5% verapamil, 2000 units of heparin, and 200 mcg of nitroglycerin were then injected through the sheath without event. A right radial artery angiogram was then performed. Under intermittent fluoroscopy, over a 0.035 inch Roadrunner guidewire, a 5 French Simmons 2 catheter was then advanced to the aortic arch region, with selective cannulations performed of the right vertebral artery, the right common carotid artery, the left common carotid artery, and the left subclavian artery. Following the procedure, the radial sheath was removed with hemostasis obtained with a wrist band.  Distal radial pulse was verified to be present distal to the wrist band. FINDINGS: Origin of the dominant right vertebral artery is widely patent. The vessel is seen to opacify to the cranial skull base. Wide patency is seen of the right vertebrobasilar junction and the right posterior-inferior cerebellar artery. The basilar artery, the right posterior cerebral artery, the superior cerebellar arteries and the anterior-inferior cerebellar arteries opacify into the capillary and venous phases. There is retrograde opacification of the left vertebrobasilar junction to the left posterior-inferior cerebellar artery with no significant antegrade clearance from the left vertebrobasilar junction. Nonvisualization of the left posterior cerebral artery is probably related to inflow of unopacified blood via the left posterior communicating artery. The right common carotid arteriogram demonstrates a moderate stenosis at the origin of the right external carotid artery. Its branches  opacify,however. The right internal carotid artery at the bulb demonstrates severe approximately 90% stenosis secondary to a circumferential smooth plaque. No intraluminal filling defects or ulcerations is seen. More distally the right internal carotid artery is seen to opacify to the cranial skull base. Wide patency is seen of the petrous cavernous and supraclinoid segments. The right middle cerebral artery and the right anterior cerebral artery opacify into the capillary and venous phases. The left subclavian arteriogram demonstrates an occluded left vertebral artery proximally. No distal reconstitution of the left vertebral artery is seen from the thyrocervical trunk branches. The left common carotid arteriogram demonstrates diffuse mild-to-moderate atherosclerotic disease of the middle 1/3 to the distal 1/3 of the left common carotid artery with approximately 50% stenosis of the distal left common carotid artery proximal to the left carotid bifurcation. The left external carotid artery demonstrates mild stenosis at its origin. Its branches opacify. The left internal carotid artery at the bulb has a relatively smooth shallow plaque along the posterior wall with no evidence of ulcerations or of intraluminal filling defects. There is a less than 20% stenosis by the NASCET criteria. More distally the left internal carotid artery is seen to opacify to the cranial skull base. There is approximately 50% stenosis of the horizontal petrous segment of the left internal carotid artery secondary to a circumferential plaque. Distal to this the petrous, the cavernous and the supraclinoid segments are widely patent. The left middle cerebral artery and the left anterior cerebral artery opacify into the capillary and venous phases. A left posterior communicating artery is seen opacifying the left posterior cerebral artery. IMPRESSION: Approximately 90% stenosis of the right internal carotid artery proximally at the bulb by the  NASCET criteria. Occluded left vertebral artery proximally with distal reconstitution of the left vertebrobasilar junction to the left posterior-inferior cerebellar artery from the right vertebral artery retrogradely. Approximately 50% stenosis of the left common carotid artery proximal to the bifurcation, and of approximately 50% stenosis of the left internal carotid artery in the horizontal petrous segment secondary to a circumferential atherosclerotic plaque. PLAN: Findings reviewed with the patient and the patient's spouse. Patient started on Plavix 75 mg a day in addition to the aspirin 81 mg a day. Endovascular revascularization of the left internal carotid artery to prevent catastrophic right cerebral hemispheric ischemic stroke reviewed with the patient and the patient's spouse. Electronically Signed   By: Luanne Bras M.D.   On: 09/12/2019 15:16   Dg Chest Port 1 View  Result Date: 09/07/2019 CLINICAL DATA:  Wheezing and shortness of breath. History of COPD. EXAM: PORTABLE CHEST 1 VIEW COMPARISON:  None. FINDINGS: Heart is normal in size. Aortic atherosclerosis. Mild bilateral  hilar prominence. There is central peribronchial thickening. Patchy bibasilar opacities. No pleural effusion, right costophrenic angle not entirely included in the field of view. No pneumothorax. IMPRESSION: 1. Central bronchial thickening. Patchy bibasilar opacities, atelectasis versus pneumonia. 2. Mild bilateral hilar prominence is likely related to overlapping vascular structures, but nonspecific. Aortic Atherosclerosis (ICD10-I70.0). Electronically Signed   By: Keith Rake M.D.   On: 09/07/2019 02:08   US Abdomen Limited Ruq  Result Date: 09/07/2019 CLINICAL DATA:  Right upper quadrant pain and mid abdominal pain. EXAM: ULTRASOUND ABDOMEN LIMITED RIGHT UPPER QUADRANT COMPARISON:  Chest CT today. FINDINGS: Gallbladder: Mild to moderate cholelithiasis with borderline wall thickening of 3.3 mm. Largest stone  measures 1 cm. Negative sonographic Murphy sign. No adjacent pericholecystic fluid. Common bile duct: Diameter: 3.6 mm. Liver: Moderate coarse increased parenchymal echogenicity without focal mass compatible with steatosis. Portal vein is patent on color Doppler imaging with normal direction of blood flow towards the liver. Other: No free fluid. IMPRESSION: Mild to moderate cholelithiasis with borderline wall thickening. No definitive findings for acute cholecystitis. Hepatic steatosis without focal mass. Electronically Signed   By: Marin Olp M.D.   On: 09/07/2019 10:51   Ir Angio Intra Extracran Sel Com Carotid Innominate Bilat Mod Sed  Result Date: 09/15/2019 CLINICAL DATA:  Recent history of syncope. 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 Abnormal ultrasound of the carotids indicative of bilateral internal carotid artery significant stenosis. EXAM: BILATERAL COMMON CAROTID AND INNOMINATE ANGIOGRAPHY; IR ANGIO VERTEBRAL SEL VERTEBRAL UNI RIGHT MOD SED; IR ULTRASOUND GUIDANCE VASC ACCESS RIGHT COMPARISON:  CT angiogram of the head and neck of September 08, 2019. Ultrasound of the carotids of September 07, 2019. MEDICATIONS: Heparin 2000 units intra-arterial. No antibiotic was administered within 1 hour of the procedure. ANESTHESIA/SEDATION: Versed 1 mg IV; Fentanyl 25 mcg IV Moderate Sedation Time:  44 minutes The patient was continuously monitored during the procedure by the interventional radiology nurse under my direct supervision. CONTRAST:  Isovue 300 approximately 60 mL. FLUOROSCOPY TIME:  Fluoroscopy Time: 18 minutes 6 seconds (791 mGy). COMPLICATIONS: None immediate. TECHNIQUE: Informed written consent was obtained from the patient after a thorough discussion of the procedural risks, benefits and alternatives. All questions were addressed. Maximal Sterile Barrier Technique was utilized including caps, mask, sterile gowns,  sterile gloves, sterile drape, hand hygiene and skin antiseptic. A timeout was performed prior to the initiation of the procedure. The right forearm to the wrist was prepped and draped in the usual sterile fashion. The right radial artery was then palpated. A dorsal palmar anastomosis was then verified successfully. Right radial artery was then interrogated with ultrasound and its morphology documented with ultrasound imaging. Using ultrasound guidance, transradial access into the right radial artery was obtained using micropuncture set over a 0.018 inch micro guidewire. Over the micro guidewire, a 4/5 French radial sheath was then inserted without difficulty. The obturator and the guidewire were removed. Good aspiration was obtained from the side port of radial sheath. A diluted cocktail of 2.5% verapamil, 2000 units of heparin, and 200 mcg of nitroglycerin were then injected through the sheath without event. A right radial artery angiogram was then performed. Under intermittent fluoroscopy, over a 0.035 inch Roadrunner guidewire, a 5 French Simmons 2 catheter was then advanced to the aortic arch region, with selective cannulations performed of the right vertebral artery, the right common carotid artery, the left common carotid artery, and the left subclavian artery. Following  the procedure, the radial sheath was removed with hemostasis obtained with a wrist band. Distal radial pulse was verified to be present distal to the wrist band. FINDINGS: Origin of the dominant right vertebral artery is widely patent. The vessel is seen to opacify to the cranial skull base. Wide patency is seen of the right vertebrobasilar junction and the right posterior-inferior cerebellar artery. The basilar artery, the right posterior cerebral artery, the superior cerebellar arteries and the anterior-inferior cerebellar arteries opacify into the capillary and venous phases. There is retrograde opacification of the left vertebrobasilar  junction to the left posterior-inferior cerebellar artery with no significant antegrade clearance from the left vertebrobasilar junction. Nonvisualization of the left posterior cerebral artery is probably related to inflow of unopacified blood via the left posterior communicating artery. The right common carotid arteriogram demonstrates a moderate stenosis at the origin of the right external carotid artery. Its branches opacify,however. The right internal carotid artery at the bulb demonstrates severe approximately 90% stenosis secondary to a circumferential smooth plaque. No intraluminal filling defects or ulcerations is seen. More distally the right internal carotid artery is seen to opacify to the cranial skull base. Wide patency is seen of the petrous cavernous and supraclinoid segments. The right middle cerebral artery and the right anterior cerebral artery opacify into the capillary and venous phases. The left subclavian arteriogram demonstrates an occluded left vertebral artery proximally. No distal reconstitution of the left vertebral artery is seen from the thyrocervical trunk branches. The left common carotid arteriogram demonstrates diffuse mild-to-moderate atherosclerotic disease of the middle 1/3 to the distal 1/3 of the left common carotid artery with approximately 50% stenosis of the distal left common carotid artery proximal to the left carotid bifurcation. The left external carotid artery demonstrates mild stenosis at its origin. Its branches opacify. The left internal carotid artery at the bulb has a relatively smooth shallow plaque along the posterior wall with no evidence of ulcerations or of intraluminal filling defects. There is a less than 20% stenosis by the NASCET criteria. More distally the left internal carotid artery is seen to opacify to the cranial skull base. There is approximately 50% stenosis of the horizontal petrous segment of the left internal carotid artery secondary to a  circumferential plaque. Distal to this the petrous, the cavernous and the supraclinoid segments are widely patent. The left middle cerebral artery and the left anterior cerebral artery opacify into the capillary and venous phases. A left posterior communicating artery is seen opacifying the left posterior cerebral artery. 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 Occluded left vertebral artery proximally with distal reconstitution of the left vertebrobasilar junction to the left posterior-inferior cerebellar artery from the right vertebral artery retrogradely. Approximately 50% stenosis of the left common carotid artery proximal to the bifurcation, and of approximately 50% stenosis of the left internal carotid artery in the horizontal petrous segment secondary to a circumferential atherosclerotic plaque. PLAN: Findings reviewed with the patient and the patient's spouse. Patient started on Plavix 75 mg a day in addition to the aspirin 81 mg a day. Endovascular revascularization of the left internal carotid artery to prevent catastrophic right cerebral hemispheric ischemic stroke reviewed with the patient and the patient's spouse. Electronically Signed   By: Luanne Bras M.D.   On: 09/12/2019 15:16   Ir Angio Vertebral Sel Subclavian Innominate Uni L Mod Sed  Result Date: 09/15/2019 CLINICAL DATA:  Recent history of syncope. 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 Abnormal ultrasound of the carotids indicative of bilateral internal carotid artery significant stenosis.  EXAM: BILATERAL COMMON CAROTID AND INNOMINATE ANGIOGRAPHY; IR ANGIO VERTEBRAL SEL VERTEBRAL UNI RIGHT MOD SED; IR ULTRASOUND GUIDANCE VASC ACCESS RIGHT  COMPARISON:  CT angiogram of the head and neck of September 08, 2019. Ultrasound of the carotids of September 07, 2019.  MEDICATIONS:  Heparin 2000 units intra-arterial. No antibiotic was administered within 1 hour of the procedure.  ANESTHESIA/SEDATION: Versed 1 mg IV; Fentanyl 25 mcg IV  Moderate Sedation Time:  44 minutes  The patient was continuously monitored during the procedure by the interventional radiology nurse under my direct supervision.  CONTRAST:  Isovue 300 approximately 60 mL.  FLUOROSCOPY TIME:  Fluoroscopy Time: 18 minutes 6 seconds (791 mGy).  COMPLICATIONS: None immediate.  TECHNIQUE: Informed written consent was obtained from the patient after a thorough discussion of the procedural risks, benefits and alternatives. All questions were addressed. Maximal Sterile Barrier Technique was utilized including caps, mask, sterile gowns, sterile gloves, sterile drape, hand hygiene and skin antiseptic. A timeout was performed prior to the initiation of the procedure.  The right forearm to the wrist was prepped and draped in the usual sterile fashion.  The right radial artery was then palpated. A dorsal palmar anastomosis was then verified successfully. Right radial artery was then interrogated with ultrasound and its morphology documented with ultrasound imaging. Using ultrasound guidance, transradial access into the right radial artery was obtained using micropuncture set over a 0.018 inch micro guidewire. Over the micro guidewire, a 4/5 French radial sheath was then inserted without difficulty. The obturator and the guidewire were removed. Good aspiration was obtained from the side port of radial sheath. A diluted cocktail of 2.5% verapamil, 2000 units of heparin, and 200 mcg of nitroglycerin were then injected through the sheath without event. A right radial artery angiogram was then performed.  Under intermittent fluoroscopy, over a 0.035 inch Roadrunner guidewire, a 5 French Simmons 2 catheter was then advanced to the aortic arch region, with selective cannulations performed of the right vertebral artery, the right common  carotid artery, the left common carotid artery, and the left subclavian artery. Following the procedure, the radial sheath was removed with hemostasis obtained with a wrist band. Distal radial pulse was verified to be present distal to the wrist band.  FINDINGS: Origin of the dominant right vertebral artery is widely patent.  The vessel is seen to opacify to the cranial skull base. Wide patency is seen of the right vertebrobasilar junction and the right posterior-inferior cerebellar artery. The basilar artery, the right posterior cerebral artery, the superior cerebellar arteries and the anterior-inferior cerebellar arteries opacify into the capillary and venous phases.  There is retrograde opacification of the left vertebrobasilar junction to the left posterior-inferior cerebellar artery with no significant antegrade clearance from the left vertebrobasilar junction.  Nonvisualization of the left posterior cerebral artery is probably related to inflow of unopacified blood via the left posterior communicating artery.  The right common carotid arteriogram demonstrates a moderate stenosis at the origin of the right external carotid artery. Its branches opacify,however.  The right internal carotid artery at the bulb demonstrates severe approximately 90% stenosis secondary to a circumferential smooth plaque. No intraluminal filling defects or ulcerations is seen.  More distally the right internal carotid artery is seen to opacify to the cranial skull base.  Wide patency is seen of the petrous cavernous  and supraclinoid segments.  The right middle cerebral artery and the right anterior cerebral artery opacify into the capillary and venous phases.  The left subclavian arteriogram demonstrates an occluded left vertebral artery proximally. No distal reconstitution of the left vertebral artery is seen from the thyrocervical trunk branches.  The left common carotid arteriogram demonstrates diffuse mild-to-moderate  atherosclerotic disease of the middle 1/3 to the distal 1/3 of the left common carotid artery with approximately 50% stenosis of the distal left common carotid artery proximal to the left carotid bifurcation.  The left external carotid artery demonstrates mild stenosis at its origin. Its branches opacify. The left internal carotid artery at the bulb has a relatively smooth shallow plaque along the posterior wall with no evidence of ulcerations or of intraluminal filling defects. There is a less than 20% stenosis by the NASCET criteria.  More distally the left internal carotid artery is seen to opacify to the cranial skull base.  There is approximately 50% stenosis of the horizontal petrous segment of the left internal carotid artery secondary to a circumferential plaque. Distal to this the petrous, the cavernous and the supraclinoid segments are widely patent.  The left middle cerebral artery and the left anterior cerebral artery opacify into the capillary and venous phases. A left posterior communicating artery is seen opacifying the left posterior cerebral artery.  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  Occluded left vertebral artery proximally with distal reconstitution of the left vertebrobasilar junction to the left posterior-inferior cerebellar artery from the right vertebral artery retrogradely.  Approximately 50% stenosis of the left common carotid artery proximal to the bifurcation, and of approximately 50% stenosis of the left internal carotid artery in the horizontal petrous segment secondary to a circumferential atherosclerotic plaque.  PLAN: Findings reviewed with the patient and the patient's spouse. Patient started on Plavix 75 mg a day in addition to the aspirin 81 mg a day. Endovascular revascularization of the left internal carotid artery to prevent catastrophic right cerebral hemispheric ischemic stroke  reviewed with the patient and the patient's spouse.   Electronically Signed   By: Luanne Bras M.D.   On: 09/12/2019 15:16  Ir Angio Vertebral Sel Vertebral Uni R Mod Sed  Result Date: 09/15/2019 CLINICAL DATA:  Recent history of syncope. 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 Abnormal ultrasound of the carotids indicative of bilateral internal carotid artery significant stenosis. EXAM: BILATERAL COMMON CAROTID AND INNOMINATE ANGIOGRAPHY; IR ANGIO VERTEBRAL SEL VERTEBRAL UNI RIGHT MOD SED; IR ULTRASOUND GUIDANCE VASC ACCESS RIGHT COMPARISON:  CT angiogram of the head and neck of September 08, 2019. Ultrasound of the carotids of September 07, 2019. MEDICATIONS: Heparin 2000 units intra-arterial. No antibiotic was administered within 1 hour of the procedure. ANESTHESIA/SEDATION: Versed 1 mg IV; Fentanyl 25 mcg IV Moderate Sedation Time:  44 minutes The patient was continuously monitored during the procedure by the interventional radiology nurse under my direct supervision. CONTRAST:  Isovue 300 approximately 60 mL. FLUOROSCOPY TIME:  Fluoroscopy Time: 18 minutes 6 seconds (791 mGy). COMPLICATIONS: None immediate. TECHNIQUE: Informed written consent was obtained from the patient after a thorough discussion of the procedural risks, benefits and alternatives. All questions were addressed. Maximal Sterile Barrier Technique was utilized including caps, mask, sterile gowns, sterile gloves, sterile drape, hand hygiene and skin antiseptic. A timeout was performed prior to the initiation of the procedure. The  right forearm to the wrist was prepped and draped in the usual sterile fashion. The right radial artery was then palpated. A dorsal palmar anastomosis was then verified successfully. Right radial artery was then interrogated with ultrasound and its morphology documented with ultrasound imaging. Using ultrasound guidance, transradial  access into the right radial artery was obtained using micropuncture set over a 0.018 inch micro guidewire. Over the micro guidewire, a 4/5 French radial sheath was then inserted without difficulty. The obturator and the guidewire were removed. Good aspiration was obtained from the side port of radial sheath. A diluted cocktail of 2.5% verapamil, 2000 units of heparin, and 200 mcg of nitroglycerin were then injected through the sheath without event. A right radial artery angiogram was then performed. Under intermittent fluoroscopy, over a 0.035 inch Roadrunner guidewire, a 5 French Simmons 2 catheter was then advanced to the aortic arch region, with selective cannulations performed of the right vertebral artery, the right common carotid artery, the left common carotid artery, and the left subclavian artery. Following the procedure, the radial sheath was removed with hemostasis obtained with a wrist band. Distal radial pulse was verified to be present distal to the wrist band. FINDINGS: Origin of the dominant right vertebral artery is widely patent. The vessel is seen to opacify to the cranial skull base. Wide patency is seen of the right vertebrobasilar junction and the right posterior-inferior cerebellar artery. The basilar artery, the right posterior cerebral artery, the superior cerebellar arteries and the anterior-inferior cerebellar arteries opacify into the capillary and venous phases. There is retrograde opacification of the left vertebrobasilar junction to the left posterior-inferior cerebellar artery with no significant antegrade clearance from the left vertebrobasilar junction. Nonvisualization of the left posterior cerebral artery is probably related to inflow of unopacified blood via the left posterior communicating artery. The right common carotid arteriogram demonstrates a moderate stenosis at the origin of the right external carotid artery. Its branches opacify,however. The right internal carotid  artery at the bulb demonstrates severe approximately 90% stenosis secondary to a circumferential smooth plaque. No intraluminal filling defects or ulcerations is seen. More distally the right internal carotid artery is seen to opacify to the cranial skull base. Wide patency is seen of the petrous cavernous and supraclinoid segments. The right middle cerebral artery and the right anterior cerebral artery opacify into the capillary and venous phases. The left subclavian arteriogram demonstrates an occluded left vertebral artery proximally. No distal reconstitution of the left vertebral artery is seen from the thyrocervical trunk branches. The left common carotid arteriogram demonstrates diffuse mild-to-moderate atherosclerotic disease of the middle 1/3 to the distal 1/3 of the left common carotid artery with approximately 50% stenosis of the distal left common carotid artery proximal to the left carotid bifurcation. The left external carotid artery demonstrates mild stenosis at its origin. Its branches opacify. The left internal carotid artery at the bulb has a relatively smooth shallow plaque along the posterior wall with no evidence of ulcerations or of intraluminal filling defects. There is a less than 20% stenosis by the NASCET criteria. More distally the left internal carotid artery is seen to opacify to the cranial skull base. There is approximately 50% stenosis of the horizontal petrous segment of the left internal carotid artery secondary to a circumferential plaque. Distal to this the petrous, the cavernous and the supraclinoid segments are widely patent. The left middle cerebral artery and the left anterior cerebral artery opacify into the capillary and venous phases. A left posterior communicating artery is  seen opacifying the left posterior cerebral artery. 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 Occluded left vertebral  artery proximally with distal reconstitution of the left vertebrobasilar junction to the left posterior-inferior cerebellar artery from the right vertebral artery retrogradely. Approximately 50% stenosis of the left common carotid artery proximal to the bifurcation, and of approximately 50% stenosis of the left internal carotid artery in the horizontal petrous segment secondary to a circumferential atherosclerotic plaque. PLAN: Findings reviewed with the patient and the patient's spouse. Patient started on Plavix 75 mg a day in addition to the aspirin 81 mg a day. Endovascular revascularization of the left internal carotid artery to prevent catastrophic right cerebral hemispheric ischemic stroke reviewed with the patient and the patient's spouse. Electronically Signed   By: Luanne Bras M.D.   On: 09/12/2019 15:16    Labs:  CBC: Recent Labs    09/07/19 0747 09/08/19 0429 09/12/19 1108 09/24/19 0623  WBC 5.5 8.0 8.6 6.9  HGB 18.4* 18.1* 17.8* 16.9  HCT 53.8* 53.1* 49.3 49.9  PLT 152 142* 162 173    COAGS: Recent Labs    09/12/19 1108 09/24/19 0623  INR 1.1 1.1    BMP: Recent Labs    09/07/19 0747 09/08/19 0429 09/12/19 1108 09/24/19 0623  NA 140 139 137 137  K 3.9 3.9 3.7 3.6  CL 106 102 103 108  CO2 20* 27 26 19*  GLUCOSE 173* 215* 117* 134*  BUN 9 13 15  7*  CALCIUM 8.4* 8.9 8.4* 8.4*  CREATININE 0.60* 0.69 0.81 0.62  GFRNONAA >60 >60 >60 >60  GFRAA >60 >60 >60 >60    LIVER FUNCTION TESTS: Recent Labs    09/07/19 0140 09/07/19 0747 09/08/19 0429  BILITOT 1.0 1.1 1.6*  AST 56* 52* 41  ALT 64* 63* 52*  ALKPHOS 62 49 49  PROT 7.8 8.0 7.6  ALBUMIN 4.1 4.0 3.8     Assessment and Plan:  Proximal right ICA stenosis. Plan for image-guided cerebral arteriogram with possible revascularization (angioplasty/stent placement) of right ICA stenosis today with Dr. Estanislado Pandy. Patient is NPO. Afebrile and WBCs WNL. Ok to proceed with Plavix/Aspirin use per Dr.  Estanislado Pandy. INR 1.1 today. P2Y12 8 PRU today. UA unremarkable. COVID negative 09/20/2019.  Risks and benefits of cerebral arteriogram with intervention were discussed with the patient including, but not limited to bleeding, infection, vascular injury, contrast induced renal failure, stroke, reperfusion hemorrhage, or even death. This interventional procedure involves the use of X-rays and because of the nature of the planned procedure, it is possible that we will have prolonged use of X-ray fluoroscopy. Potential radiation risks to you include (but are not limited to) the following: - A slightly elevated risk for cancer  several years later in life. This risk is typically less than 0.5% percent. This risk is low in comparison to the normal incidence of human cancer, which is 33% for women and 50% for men according to the [employer]. - Radiation induced injury can include skin redness, resembling a rash, tissue breakdown / ulcers and hair loss (which can be temporary or permanent).  The likelihood of either of these occurring depends on the difficulty of the procedure and whether you are sensitive to radiation due to previous procedures, disease, or genetic conditions.  IF your procedure requires a prolonged use of radiation, you will be notified and given written instructions for further action.  It is your responsibility to monitor  the irradiated area for the 2 weeks following the procedure and to notify your physician if you are concerned that you have suffered a radiation induced injury.   All of the patient's questions were answered, patient is agreeable to proceed. Consent signed and in chart.   Thank you for this interesting consult.  I greatly enjoyed meeting JURON PETITTE and look forward to participating in their care.  A copy of this report was sent to the requesting provider on this date.  Electronically Signed: Earley Abide, PA-C 09/24/2019, 8:47 AM   I spent a  total of 40 Minutes in face to face in clinical consultation, greater than 50% of which was counseling/coordinating care for proximal right ICA stenosis/cerebral arteriogram with possible endovascular revascularization.

## 2019-09-25 ENCOUNTER — Encounter (HOSPITAL_COMMUNITY): Payer: Self-pay | Admitting: Interventional Radiology

## 2019-09-25 LAB — CBC
HCT: 34.9 % — ABNORMAL LOW (ref 39.0–52.0)
Hemoglobin: 12.5 g/dL — ABNORMAL LOW (ref 13.0–17.0)
MCH: 35.7 pg — ABNORMAL HIGH (ref 26.0–34.0)
MCHC: 35.8 g/dL (ref 30.0–36.0)
MCV: 99.7 fL (ref 80.0–100.0)
Platelets: 126 10*3/uL — ABNORMAL LOW (ref 150–400)
RBC: 3.5 MIL/uL — ABNORMAL LOW (ref 4.22–5.81)
RDW: 12.5 % (ref 11.5–15.5)
WBC: 4.4 10*3/uL (ref 4.0–10.5)
nRBC: 0 % (ref 0.0–0.2)

## 2019-09-25 LAB — BASIC METABOLIC PANEL
Anion gap: 8 (ref 5–15)
BUN: 9 mg/dL (ref 8–23)
CO2: 22 mmol/L (ref 22–32)
Calcium: 8.2 mg/dL — ABNORMAL LOW (ref 8.9–10.3)
Chloride: 109 mmol/L (ref 98–111)
Creatinine, Ser: 0.69 mg/dL (ref 0.61–1.24)
GFR calc Af Amer: >60 mL/min (ref >60)
GFR calc non Af Amer: >60 mL/min (ref >60)
Glucose, Bld: 111 mg/dL — ABNORMAL HIGH (ref 70–99)
Potassium: 3.9 mmol/L (ref 3.5–5.1)
Sodium: 139 mmol/L (ref 135–145)

## 2019-09-25 LAB — HEPARIN LEVEL (UNFRACTIONATED): Heparin Unfractionated: <0.1 IU/mL — ABNORMAL LOW (ref 0.30–0.70)

## 2019-09-25 MED ORDER — ASPIRIN EC 81 MG PO TBEC: 81.0000 mg | DELAYED_RELEASE_TABLET | Freq: Every day | ORAL | 3 refills | Status: DC

## 2019-09-25 NOTE — Plan of Care (Signed)
Pt adequate for d/c. Vitals stable, lines removed. Discharge teaching provided to patient and wife at bedside. All questions answered. Pt transported to front entrance in wheelchair by tech and sent home with wife.

## 2019-09-25 NOTE — Progress Notes (Signed)
ANTICOAGULATION CONSULT NOTE  Pharmacy Consult for Heparin Indication: Post Interventional Neuroradiology Procedure  No Known Allergies  Patient Measurements: Height: 5\' 10"  (177.8 cm) Weight: 178 lb 5.6 oz (80.9 kg) IBW/kg (Calculated) : 73 Heparin Dosing Weight: 79.4 kg  Vital Signs: Temp: 98.4 F (36.9 C) (10/01 0400) Temp Source: Oral (10/01 0400) BP: 92/54 (10/01 0500) Pulse Rate: 50 (10/01 0500)  Labs: Recent Labs    09/24/19 0623 09/24/19 2030 09/25/19 0500  HGB 16.9  --   --   HCT 49.9  --   --   PLT 173  --   --   LABPROT 13.6  --   --   INR 1.1  --   --   HEPARINUNFRC  --  <0.10* <0.10*  CREATININE 0.62  --   --     Estimated Creatinine Clearance: 97.6 mL/min (by C-G formula based on SCr of 0.62 mg/dL).  Assessment: 38 YOM with right ICA stenosis who is now s/p endovascular revascularization (angioplasty/stent placement) of right ICA stenosis. No anticoagulation pta  Heparin level came back undetectable, on 850 units/hr. No s/sx of bleeding or infusion issues per nursing.   Goal of Therapy:  0.1-0.25 Monitor platelets by anticoagulation protocol: Yes   Plan:  Increase Heparin IV to 950 units/hr Daily HL cbc F/U stop at 0700 per protocol?  Antonietta Jewel, PharmD, BCCCP Clinical Pharmacist  Phone: [phone]  Please check AMION for all [place] phone numbers After 10:00 PM, call [place] [phone] 09/25/2019 6:19 AM

## 2019-09-25 NOTE — Discharge Instructions (Signed)
Carotid Angioplasty With Stent, Care After This sheet gives you information about how to care for yourself after your procedure. Your doctor may also give you more specific instructions. If you have problems or questions, contact your doctor. What can I expect after the procedure? After the procedure, it is common to have:  Bruising at the site where the tube (catheter) was inserted. This usually goes away within 1-2 weeks.  A small amount of blood or clear fluid coming from your cut from surgery (incision).  A small amount of blood collecting under your skin (hematoma) around the tube site. This may form a lump that can be sore and tender. This usually lasts for 1-2 weeks. Follow these instructions at home: Medicines  Take over-the-counter and prescription medicines only as told by your doctor.  Your doctor may prescribe a medicine to thin your blood after the procedure. This will help to prevent blood clots.  If you were prescribed an antibiotic medicine, take it as told by your doctor. Do not stop taking the antibiotic even if you start to feel better.  Ask your doctor if the medicine prescribed to you requires you to avoid driving or using heavy machinery. Incision care  Keep your cut from surgery clean and dry.  Follow instructions from your doctor about how to take care of your cut from surgery. Make sure you: ? Wash your hands with soap and water before and after you change your bandage (dressing). If you cannot use soap and water, use hand sanitizer. ? Change your bandage as told by your doctor. ? Do not rub the site. This may cause bleeding.  Check the area around your cut every day for signs of infection. Check for: ? More redness, swelling, or pain. ? More fluid or blood. ? Warmth. ? Pus or a bad smell. Activity  Return to your normal activities as told by your doctor. Ask your doctor what activities are safe for you.  Do not lift anything that is heavier than 10 lb  (4.5 kg), or the limit that you are told, until your doctor says that it is safe.  Avoid sex until your doctor says that this is safe for you. Eating and drinking  Follow instructions from your doctor about what you cannot eat or drink.  Drink enough fluid to keep your pee (urine) pale yellow.  Eat a heart-healthy diet. This includes foods like fresh fruits and vegetables, whole grains, low-fat dairy products, and low-fat (lean) meats. Avoid foods that are: ? High in salt, saturated fat, or sugar. ? Canned or highly processed. ? Fried. Lifestyle  If you drink alcohol: ? Limit how much you use to:  0-1 drink a day for women.  0-2 drinks a day for men. ? Be aware of how much alcohol is in your drink. In the [place] one drink equals one 12 oz bottle of beer (355 mL), one 5 oz glass of wine (148 mL), or one 1 oz glass of hard liquor (44 mL).  Do not use any products that contain nicotine or tobacco, such as cigarettes, e-cigarettes, and chewing tobacco. If you need help quitting, ask your doctor.  Work with your doctor to keep your blood pressure under control.  Stay at a healthy weight. General instructions  Do not take baths, swim, or use a hot tub until your doctor approves.  Avoid straining to poop to keep the cut from bleeding.  Tell all doctors who provide care for you that you have a  stent.  Keep all follow-up visits as told by your doctor. This is important. Contact a doctor if:  You have more redness, swelling, or pain around your cut.  You have more fluid or blood coming from your cut.  The area around your cut feels warm to the touch.  You have pus or a bad smell coming from your cut.  You have a lump caused by bleeding under your skin, and the lump does not go away after 2 weeks.  You have a fever. Get help right away if:  You have a hard time breathing.  You have pain in your chest.  You have any signs of a stroke. You may be at risk for a stroke even  after this procedure. You may be at greater risk of a stroke if you have diabetes, lung disease, kidney disease, had a previous stroke, or are 72 years of age or older. "BE FAST" is an easy way to remember the main warning signs: ? B - Balance. Signs are dizziness, sudden trouble walking, or loss of balance. ? E - Eyes. Signs are trouble seeing or a change in how you see. ? F - Face. Signs are sudden weakness or loss of feeling of the face, or the face or eyelid drooping on one side. ? A - Arms. Signs are weakness or loss of feeling in an arm. This happens suddenly and usually on one side of the body. ? S - Speech. Signs are sudden trouble speaking, slurred speech, or trouble understanding what people say. ? T - Time. Time to call emergency services. Write down what time symptoms started.  You have other signs of a stroke, such as: ? A sudden, very bad headache with no known cause. ? Feeling sick to your stomach (nausea). ? Vomiting. ? Jerky movements you cannot control (seizure).  You notice a lump caused by bleeding under your skin and the lump is quickly getting larger.  You suddenly get pain in the area where your stent was placed.  Your cut from surgery starts to bleed and does not stop after you hold pressure on it for a few minutes. These symptoms may be an emergency. Do not wait to see if the symptoms will go away. Get medical help right away. Call your local emergency services (911 in the [place] Do not drive yourself to the hospital. Summary  After the procedure, it is common to have bruising or a small amount of blood or fluid in the area where the surgery cut was made.  Take over-the-counter and prescription medicines only as told by your doctor.  Return to your normal activities as told by your doctor. Ask your doctor what activities are safe for you.  Make sure you know which symptoms to watch for and when to get medical help right away. This information is not intended to  replace advice given to you by your health care provider. Make sure you discuss any questions you have with your health care provider. Document Released: 12/16/2013 Document Revised: 01/20/2019 Document Reviewed: 10/24/2018 Elsevier Patient Education  2020 [employer].

## 2019-09-25 NOTE — Discharge Summary (Signed)
Patient ID: Nathan Hart MRN: [id number] DOB/AGE: 07/09/1956 63 y.o.  Admit date: 09/24/2019 Discharge date: 09/25/2019  Supervising Physician: Luanne Bras  Patient Status: [facility] - In-pt  Admission Diagnoses: Carotid stenosis, symptomatic w/o infarct  Discharge Diagnoses:  Active Problems:   Carotid stenosis, symptomatic w/o infarct   Discharged Condition: stable  Hospital Course:  Patient presented to [facility] 09/24/2019 for an image-guided cerebral arteriogram with revascularization of proximal right ICA stenosis using stent assisted angioplasty by Dr. Estanislado Pandy. Procedure occurred without major complications and patient was transferred to neuro ICU in stable condition (VSS, right groin incision stable) for overnight observation. No major events occurred overnight.  Patient awake and alert laying in bed with no complaints at this time. Denies headache, weakness, numbness/tingling, dizziness, vision changes, hearing changes, tinnitus, or speech difficulty. Right groin incision stable. Plan to discharge patient home today and follow-up with Dr. Estanislado Pandy in clinic 2 weeks after discharge.   Consults: None  Significant Diagnostic Studies: Ct Head Wo Contrast  Result Date: 09/07/2019 CLINICAL DATA:  Found on the floor last night. EXAM: CT HEAD WITHOUT CONTRAST TECHNIQUE: Contiguous axial images were obtained from the base of the skull through the vertex without intravenous contrast. COMPARISON:  None. FINDINGS: Brain: No focal abnormality is seen affecting the brainstem or cerebellum. Right cerebral hemisphere shows an old right frontoparietal cortical and subcortical infarction consistent with MCA branch vessel territory infarction, progressed to atrophy, encephalomalacia and gliosis. There is low-density in the left basal ganglia which probably represents an old stroke, though the possibility of subacute infarction in that region is not completely excluded. No evidence of mass  effect or hemorrhage. No hydrocephalus or extra-axial collection. Vascular: There is atherosclerotic calcification of the major vessels at the base of the brain. Skull: Negative Sinuses/Orbits: Chronic opacification of the right maxillary sinus. Other sinuses clear. Other: None IMPRESSION: No definite acute finding by CT. 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 Probably old left basal ganglia infarction. There is some potential there could be subacute infarction in the left basal ganglia. No hemorrhage or mass effect. Chronic inflammatory changes of the right maxillary sinus. Electronically Signed   By: Nelson Chimes M.D.   On: 09/07/2019 09:39   Ct Angio Neck W Or Wo Contrast  Result Date: 09/08/2019 CLINICAL DATA:  Syncope.  Bilateral ICA stenosis by ultrasound EXAM: CT ANGIOGRAPHY NECK TECHNIQUE: Multidetector CT imaging of the neck was performed using the standard protocol during bolus administration of intravenous contrast. Multiplanar CT image reconstructions and MIPs were obtained to evaluate the vascular anatomy. Carotid stenosis measurements (when applicable) are obtained utilizing NASCET criteria, using the distal internal carotid diameter as the denominator. CONTRAST:  136mL OMNIPAQUE IOHEXOL 350 MG/ML SOLN COMPARISON:  None. FINDINGS: Aortic arch: Atherosclerotic plaque.  Three vessel branching. Right carotid system: Diffuse atheromatous wall thickening of the common carotid artery with particularly bulky plaque about the bifurcation. Proximal ICA stenosis measures 75%. Left carotid system: Extensive atherosclerotic plaque on the common carotid with 60% stenosis in the distal ICA. Bulky plaque at the bifurcation causes 70% stenosis when compared to the downstream ICA. No downstream beading. Heavily calcified ICA at the skull base. Vertebral arteries: Proximal atherosclerosis along the subclavians. No flow limiting stenosis in the subclavians. Strong right vertebral artery dominance  without superimposed flow limiting stenosis. Thready/intermittent flow in the proximal left vertebral artery with improved flow by the dura. Heavily calcified proximal left V4 segment atherosclerotic plaque with at least moderate stenosis. The distal left V4 segment and  left PICA are patent. Skeleton: Advanced cervical disc degeneration with osteitis and reversal of cervical lordosis. Other neck: Right maxillary sinusitis with complete opacification. Upper chest: Centrilobular emphysema. IMPRESSION: 1. Advanced atherosclerosis with right ICA stenosis measuring ~75% and left bifurcation stenosis measuring ~70%. 2. 60% distal left common carotid stenosis. 3. Thready flow in the proximal non-dominant left vertebral artery which is patent at the dura and basilar. Electronically Signed   By: Monte Fantasia M.D.   On: 09/08/2019 10:31   Ct Angio Chest Pe W Or Wo Contrast  Result Date: 09/07/2019 CLINICAL DATA:  Syncope. Found on floor at home last night. Right-sided chest pain. Possible right rib fracture. EXAM: CT ANGIOGRAPHY CHEST WITH CONTRAST TECHNIQUE: Multidetector CT imaging of the chest was performed using the standard protocol during bolus administration of intravenous contrast. Multiplanar CT image reconstructions and MIPs were obtained to evaluate the vascular anatomy. CONTRAST:  144mL OMNIPAQUE IOHEXOL 350 MG/ML SOLN COMPARISON:  None. FINDINGS: Cardiovascular: Heart is normal size. Mild calcified plaque over the left main, left anterior descending and lateral circumflex coronary arteries. Mild calcified plaque over the thoracic aorta. Thoracic aorta is otherwise normal in caliber. Pulmonary arterial system is well opacified and demonstrates no evidence of emboli. Remaining vascular structures are unremarkable. Mediastinum/Nodes: No mediastinal or hilar adenopathy. Remaining mediastinal structures are unremarkable. Lungs/Pleura: Lungs are adequately inflated with mild centrilobular emphysematous disease.  Minimal lingular scarring. Tiny peripheral calcified granuloma over the left apex. Minimal opacification over the left lower lobe likely atelectasis and less likely infection. Small amount of aspirate material over the dependent portion of the distal trachea. Remaining airways are unremarkable. Upper Abdomen: Minimal sludge versus cholelithiasis as the gallbladder is only partially visualized. Calcified plaque over the abdominal aorta. 3.7 cm partially visualized right adrenal adenoma. No acute findings. Musculoskeletal: Degenerative change of the spine. Review of the MIP images confirms the above findings. IMPRESSION: No evidence of pulmonary embolism. Mild right basilar opacification likely atelectasis and less likely infection. Minimal aspirate material over the dependent portion of the distal trachea. Minimal lingular scarring. Aortic Atherosclerosis (ICD10-I70.0) and Emphysema (ICD10-J43.9). Minimal atherosclerotic coronary artery disease. Mild cholelithiasis/sludge. 3.7 cm right adrenal adenoma partially visualized. Electronically Signed   By: Marin Olp M.D.   On: 09/07/2019 09:50   US Carotid Bilateral  Result Date: 09/07/2019 CLINICAL DATA:  Hypertension, syncope, hyperlipidemia EXAM: BILATERAL CAROTID DUPLEX ULTRASOUND TECHNIQUE: Pearline Cables scale imaging, color Doppler and duplex ultrasound were performed of bilateral carotid and vertebral arteries in the neck. COMPARISON:  None. FINDINGS: Criteria: Quantification of carotid stenosis is based on velocity parameters that correlate the residual internal carotid diameter with NASCET-based stenosis levels, using the diameter of the distal internal carotid lumen as the denominator for stenosis measurement. The following velocity measurements were obtained: RIGHT ICA: 425/69 cm/sec CCA: 0000000 cm/sec SYSTOLIC ICA/CCA RATIO:  3.6 ECA: 445 cm/sec LEFT ICA: 351/46 cm/sec CCA: [id number] cm/sec SYSTOLIC ICA/CCA RATIO:  2.6 ECA: 328 cm/sec RIGHT CAROTID ARTERY:  Extensive carotid bifurcation atherosclerosis. Marked velocity elevation in the proximal right ICA measuring 425/69 centimeters/second. Exam is difficult because of the degree of calcification. Right ICA stenosis estimated at greater than 70% by ultrasound criteria. RIGHT VERTEBRAL ARTERY:  Antegrade LEFT CAROTID ARTERY: Extensive left carotid bifurcation atherosclerosis. Degree of calcification limits the exam. Proximal left ICA velocity elevation measures 351/46 centimeters/second with turbulent flow noted. Left ICA stenosis also estimated greater than 70%. LEFT VERTEBRAL ARTERY:  Not visualized, likely occluded IMPRESSION: Extensive bilateral carotid atherosclerosis. Bilateral ICA stenoses estimated at greater  than 70% by ultrasound criteria. Patent antegrade right vertebral artery. 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 These results will be called to the ordering clinician or representative by the Radiologist Assistant, and communication documented in the PACS or zVision Dashboard. Electronically Signed   By: Jerilynn Mages.  Shick M.D.   On: 09/07/2019 14:55   Ir US Guide Vasc Access Right  Result Date: 09/15/2019 CLINICAL DATA:  Recent history of syncope. 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 Abnormal ultrasound of the carotids indicative of bilateral internal carotid artery significant stenosis. EXAM: BILATERAL COMMON CAROTID AND INNOMINATE ANGIOGRAPHY; IR ANGIO VERTEBRAL SEL VERTEBRAL UNI RIGHT MOD SED; IR ULTRASOUND GUIDANCE VASC ACCESS RIGHT COMPARISON:  CT angiogram of the head and neck of September 08, 2019. Ultrasound of the carotids of September 07, 2019. MEDICATIONS: Heparin 2000 units intra-arterial. No antibiotic was administered within 1 hour of the procedure. ANESTHESIA/SEDATION: Versed 1 mg IV; Fentanyl 25 mcg IV Moderate Sedation Time:  44 minutes The patient was continuously monitored during the procedure by the  interventional radiology nurse under my direct supervision. CONTRAST:  Isovue 300 approximately 60 mL. FLUOROSCOPY TIME:  Fluoroscopy Time: 18 minutes 6 seconds (791 mGy). COMPLICATIONS: None immediate. TECHNIQUE: Informed written consent was obtained from the patient after a thorough discussion of the procedural risks, benefits and alternatives. All questions were addressed. Maximal Sterile Barrier Technique was utilized including caps, mask, sterile gowns, sterile gloves, sterile drape, hand hygiene and skin antiseptic. A timeout was performed prior to the initiation of the procedure. The right forearm to the wrist was prepped and draped in the usual sterile fashion. The right radial artery was then palpated. A dorsal palmar anastomosis was then verified successfully. Right radial artery was then interrogated with ultrasound and its morphology documented with ultrasound imaging. Using ultrasound guidance, transradial access into the right radial artery was obtained using micropuncture set over a 0.018 inch micro guidewire. Over the micro guidewire, a 4/5 French radial sheath was then inserted without difficulty. The obturator and the guidewire were removed. Good aspiration was obtained from the side port of radial sheath. A diluted cocktail of 2.5% verapamil, 2000 units of heparin, and 200 mcg of nitroglycerin were then injected through the sheath without event. A right radial artery angiogram was then performed. Under intermittent fluoroscopy, over a 0.035 inch Roadrunner guidewire, a 5 French Simmons 2 catheter was then advanced to the aortic arch region, with selective cannulations performed of the right vertebral artery, the right common carotid artery, the left common carotid artery, and the left subclavian artery. Following the procedure, the radial sheath was removed with hemostasis obtained with a wrist band. Distal radial pulse was verified to be present distal to the wrist band. FINDINGS: Origin of the  dominant right vertebral artery is widely patent. The vessel is seen to opacify to the cranial skull base. Wide patency is seen of the right vertebrobasilar junction and the right posterior-inferior cerebellar artery. The basilar artery, the right posterior cerebral artery, the superior cerebellar arteries and the anterior-inferior cerebellar arteries opacify into the capillary and venous phases. There is retrograde opacification of the left vertebrobasilar junction to the left posterior-inferior cerebellar artery with no significant antegrade clearance from the left vertebrobasilar junction. Nonvisualization of the left posterior cerebral artery is probably related to inflow of unopacified blood via the left posterior communicating artery. The right common carotid arteriogram demonstrates a moderate stenosis at the origin of the right external carotid artery.  Its branches opacify,however. The right internal carotid artery at the bulb demonstrates severe approximately 90% stenosis secondary to a circumferential smooth plaque. No intraluminal filling defects or ulcerations is seen. More distally the right internal carotid artery is seen to opacify to the cranial skull base. Wide patency is seen of the petrous cavernous and supraclinoid segments. The right middle cerebral artery and the right anterior cerebral artery opacify into the capillary and venous phases. The left subclavian arteriogram demonstrates an occluded left vertebral artery proximally. No distal reconstitution of the left vertebral artery is seen from the thyrocervical trunk branches. The left common carotid arteriogram demonstrates diffuse mild-to-moderate atherosclerotic disease of the middle 1/3 to the distal 1/3 of the left common carotid artery with approximately 50% stenosis of the distal left common carotid artery proximal to the left carotid bifurcation. The left external carotid artery demonstrates mild stenosis at its origin. Its branches  opacify. The left internal carotid artery at the bulb has a relatively smooth shallow plaque along the posterior wall with no evidence of ulcerations or of intraluminal filling defects. There is a less than 20% stenosis by the NASCET criteria. More distally the left internal carotid artery is seen to opacify to the cranial skull base. There is approximately 50% stenosis of the horizontal petrous segment of the left internal carotid artery secondary to a circumferential plaque. Distal to this the petrous, the cavernous and the supraclinoid segments are widely patent. The left middle cerebral artery and the left anterior cerebral artery opacify into the capillary and venous phases. A left posterior communicating artery is seen opacifying the left posterior cerebral artery. 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 Occluded left vertebral artery proximally with distal reconstitution of the left vertebrobasilar junction to the left posterior-inferior cerebellar artery from the right vertebral artery retrogradely. Approximately 50% stenosis of the left common carotid artery proximal to the bifurcation, and of approximately 50% stenosis of the left internal carotid artery in the horizontal petrous segment secondary to a circumferential atherosclerotic plaque. PLAN: Findings reviewed with the patient and the patient's spouse. Patient started on Plavix 75 mg a day in addition to the aspirin 81 mg a day. Endovascular revascularization of the left internal carotid artery to prevent catastrophic right cerebral hemispheric ischemic stroke reviewed with the patient and the patient's spouse. Electronically Signed   By: Luanne Bras M.D.   On: 09/12/2019 15:16   Dg Chest Port 1 View  Result Date: 09/07/2019 CLINICAL DATA:  Wheezing and shortness of breath. History of COPD. EXAM: PORTABLE CHEST 1 VIEW COMPARISON:  None. FINDINGS: Heart is  normal in size. Aortic atherosclerosis. Mild bilateral hilar prominence. There is central peribronchial thickening. Patchy bibasilar opacities. No pleural effusion, right costophrenic angle not entirely included in the field of view. No pneumothorax. IMPRESSION: 1. Central bronchial thickening. Patchy bibasilar opacities, atelectasis versus pneumonia. 2. Mild bilateral hilar prominence is likely related to overlapping vascular structures, but nonspecific. Aortic Atherosclerosis (ICD10-I70.0). Electronically Signed   By: Keith Rake M.D.   On: 09/07/2019 02:08   US Abdomen Limited Ruq  Result Date: 09/07/2019 CLINICAL DATA:  Right upper quadrant pain and mid abdominal pain. EXAM: ULTRASOUND ABDOMEN LIMITED RIGHT UPPER QUADRANT COMPARISON:  Chest CT today. FINDINGS: Gallbladder: Mild to moderate cholelithiasis with borderline wall thickening of 3.3 mm. Largest stone measures 1 cm. Negative sonographic Murphy sign. No adjacent pericholecystic fluid. Common bile duct: Diameter: 3.6 mm. Liver: Moderate coarse increased parenchymal  echogenicity without focal mass compatible with steatosis. Portal vein is patent on color Doppler imaging with normal direction of blood flow towards the liver. Other: No free fluid. IMPRESSION: Mild to moderate cholelithiasis with borderline wall thickening. No definitive findings for acute cholecystitis. Hepatic steatosis without focal mass. Electronically Signed   By: Marin Olp M.D.   On: 09/07/2019 10:51   Ir Angio Intra Extracran Sel Com Carotid Innominate Bilat Mod Sed  Result Date: 09/15/2019 CLINICAL DATA:  Recent history of syncope. 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 Abnormal ultrasound of the carotids indicative of bilateral internal carotid artery significant stenosis. EXAM: BILATERAL COMMON CAROTID AND INNOMINATE ANGIOGRAPHY; IR ANGIO VERTEBRAL SEL VERTEBRAL UNI RIGHT MOD SED; IR  ULTRASOUND GUIDANCE VASC ACCESS RIGHT COMPARISON:  CT angiogram of the head and neck of September 08, 2019. Ultrasound of the carotids of September 07, 2019. MEDICATIONS: Heparin 2000 units intra-arterial. No antibiotic was administered within 1 hour of the procedure. ANESTHESIA/SEDATION: Versed 1 mg IV; Fentanyl 25 mcg IV Moderate Sedation Time:  44 minutes The patient was continuously monitored during the procedure by the interventional radiology nurse under my direct supervision. CONTRAST:  Isovue 300 approximately 60 mL. FLUOROSCOPY TIME:  Fluoroscopy Time: 18 minutes 6 seconds (791 mGy). COMPLICATIONS: None immediate. TECHNIQUE: Informed written consent was obtained from the patient after a thorough discussion of the procedural risks, benefits and alternatives. All questions were addressed. Maximal Sterile Barrier Technique was utilized including caps, mask, sterile gowns, sterile gloves, sterile drape, hand hygiene and skin antiseptic. A timeout was performed prior to the initiation of the procedure. The right forearm to the wrist was prepped and draped in the usual sterile fashion. The right radial artery was then palpated. A dorsal palmar anastomosis was then verified successfully. Right radial artery was then interrogated with ultrasound and its morphology documented with ultrasound imaging. Using ultrasound guidance, transradial access into the right radial artery was obtained using micropuncture set over a 0.018 inch micro guidewire. Over the micro guidewire, a 4/5 French radial sheath was then inserted without difficulty. The obturator and the guidewire were removed. Good aspiration was obtained from the side port of radial sheath. A diluted cocktail of 2.5% verapamil, 2000 units of heparin, and 200 mcg of nitroglycerin were then injected through the sheath without event. A right radial artery angiogram was then performed. Under intermittent fluoroscopy, over a 0.035 inch Roadrunner guidewire, a 5 French  Simmons 2 catheter was then advanced to the aortic arch region, with selective cannulations performed of the right vertebral artery, the right common carotid artery, the left common carotid artery, and the left subclavian artery. Following the procedure, the radial sheath was removed with hemostasis obtained with a wrist band. Distal radial pulse was verified to be present distal to the wrist band. FINDINGS: Origin of the dominant right vertebral artery is widely patent. The vessel is seen to opacify to the cranial skull base. Wide patency is seen of the right vertebrobasilar junction and the right posterior-inferior cerebellar artery. The basilar artery, the right posterior cerebral artery, the superior cerebellar arteries and the anterior-inferior cerebellar arteries opacify into the capillary and venous phases. There is retrograde opacification of the left vertebrobasilar junction to the left posterior-inferior cerebellar artery with no significant antegrade clearance from the left vertebrobasilar junction. Nonvisualization of the left posterior cerebral artery is probably related to inflow of unopacified blood via the left posterior communicating artery. The right common carotid  arteriogram demonstrates a moderate stenosis at the origin of the right external carotid artery. Its branches opacify,however. The right internal carotid artery at the bulb demonstrates severe approximately 90% stenosis secondary to a circumferential smooth plaque. No intraluminal filling defects or ulcerations is seen. More distally the right internal carotid artery is seen to opacify to the cranial skull base. Wide patency is seen of the petrous cavernous and supraclinoid segments. The right middle cerebral artery and the right anterior cerebral artery opacify into the capillary and venous phases. The left subclavian arteriogram demonstrates an occluded left vertebral artery proximally. No distal reconstitution of the left vertebral  artery is seen from the thyrocervical trunk branches. The left common carotid arteriogram demonstrates diffuse mild-to-moderate atherosclerotic disease of the middle 1/3 to the distal 1/3 of the left common carotid artery with approximately 50% stenosis of the distal left common carotid artery proximal to the left carotid bifurcation. The left external carotid artery demonstrates mild stenosis at its origin. Its branches opacify. The left internal carotid artery at the bulb has a relatively smooth shallow plaque along the posterior wall with no evidence of ulcerations or of intraluminal filling defects. There is a less than 20% stenosis by the NASCET criteria. More distally the left internal carotid artery is seen to opacify to the cranial skull base. There is approximately 50% stenosis of the horizontal petrous segment of the left internal carotid artery secondary to a circumferential plaque. Distal to this the petrous, the cavernous and the supraclinoid segments are widely patent. The left middle cerebral artery and the left anterior cerebral artery opacify into the capillary and venous phases. A left posterior communicating artery is seen opacifying the left posterior cerebral artery. 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 Occluded left vertebral artery proximally with distal reconstitution of the left vertebrobasilar junction to the left posterior-inferior cerebellar artery from the right vertebral artery retrogradely. Approximately 50% stenosis of the left common carotid artery proximal to the bifurcation, and of approximately 50% stenosis of the left internal carotid artery in the horizontal petrous segment secondary to a circumferential atherosclerotic plaque. PLAN: Findings reviewed with the patient and the patient's spouse. Patient started on Plavix 75 mg a day in addition to the aspirin 81 mg a day. Endovascular  revascularization of the left internal carotid artery to prevent catastrophic right cerebral hemispheric ischemic stroke reviewed with the patient and the patient's spouse. Electronically Signed   By: Luanne Bras M.D.   On: 09/12/2019 15:16   Ir Angio Vertebral Sel Subclavian Innominate Uni L Mod Sed  Result Date: 09/15/2019 CLINICAL DATA:  Recent history of syncope. 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 Abnormal ultrasound of the carotids indicative of bilateral internal carotid artery significant stenosis.  EXAM: BILATERAL COMMON CAROTID AND INNOMINATE ANGIOGRAPHY; IR ANGIO VERTEBRAL SEL VERTEBRAL UNI RIGHT MOD SED; IR ULTRASOUND GUIDANCE VASC ACCESS RIGHT  COMPARISON:  CT angiogram of the head and neck of September 08, 2019. Ultrasound of the carotids of September 07, 2019.  MEDICATIONS: Heparin 2000 units intra-arterial. No antibiotic was administered within 1 hour of the procedure.  ANESTHESIA/SEDATION: Versed 1 mg IV; Fentanyl 25 mcg IV  Moderate Sedation Time:  44 minutes  The patient was continuously monitored during the procedure by the interventional radiology nurse under my direct supervision.  CONTRAST:  Isovue 300 approximately 60 mL.  FLUOROSCOPY TIME:  Fluoroscopy Time:  18 minutes 6 seconds (791 mGy).  COMPLICATIONS: None immediate.  TECHNIQUE: Informed written consent was obtained from the patient after a thorough discussion of the procedural risks, benefits and alternatives. All questions were addressed. Maximal Sterile Barrier Technique was utilized including caps, mask, sterile gowns, sterile gloves, sterile drape, hand hygiene and skin antiseptic. A timeout was performed prior to the initiation of the procedure.  The right forearm to the wrist was prepped and draped in the usual sterile fashion.  The right radial artery was then palpated. A dorsal palmar anastomosis was then verified successfully.  Right radial artery was then interrogated with ultrasound and its morphology documented with ultrasound imaging. Using ultrasound guidance, transradial access into the right radial artery was obtained using micropuncture set over a 0.018 inch micro guidewire. Over the micro guidewire, a 4/5 French radial sheath was then inserted without difficulty. The obturator and the guidewire were removed. Good aspiration was obtained from the side port of radial sheath. A diluted cocktail of 2.5% verapamil, 2000 units of heparin, and 200 mcg of nitroglycerin were then injected through the sheath without event. A right radial artery angiogram was then performed.  Under intermittent fluoroscopy, over a 0.035 inch Roadrunner guidewire, a 5 French Simmons 2 catheter was then advanced to the aortic arch region, with selective cannulations performed of the right vertebral artery, the right common carotid artery, the left common carotid artery, and the left subclavian artery. Following the procedure, the radial sheath was removed with hemostasis obtained with a wrist band. Distal radial pulse was verified to be present distal to the wrist band.  FINDINGS: Origin of the dominant right vertebral artery is widely patent.  The vessel is seen to opacify to the cranial skull base. Wide patency is seen of the right vertebrobasilar junction and the right posterior-inferior cerebellar artery. The basilar artery, the right posterior cerebral artery, the superior cerebellar arteries and the anterior-inferior cerebellar arteries opacify into the capillary and venous phases.  There is retrograde opacification of the left vertebrobasilar junction to the left posterior-inferior cerebellar artery with no significant antegrade clearance from the left vertebrobasilar junction.  Nonvisualization of the left posterior cerebral artery is probably related to inflow of unopacified blood via the left posterior communicating artery.  The right common  carotid arteriogram demonstrates a moderate stenosis at the origin of the right external carotid artery. Its branches opacify,however.  The right internal carotid artery at the bulb demonstrates severe approximately 90% stenosis secondary to a circumferential smooth plaque. No intraluminal filling defects or ulcerations is seen.  More distally the right internal carotid artery is seen to opacify to the cranial skull base.  Wide patency is seen of the petrous cavernous and supraclinoid segments.  The right middle cerebral artery and the right anterior cerebral artery opacify into the capillary and venous phases.  The left subclavian arteriogram demonstrates an occluded left vertebral artery proximally. No distal reconstitution of the left vertebral artery is seen from the thyrocervical trunk branches.  The left common carotid arteriogram demonstrates diffuse mild-to-moderate atherosclerotic disease of the middle 1/3 to the distal 1/3 of the left common carotid artery with approximately 50% stenosis of the distal left common carotid artery proximal to the left carotid bifurcation.  The left external carotid artery demonstrates mild stenosis at its origin. Its branches opacify. The left internal carotid artery at the bulb has a relatively smooth shallow plaque along the posterior wall with no evidence of ulcerations or of intraluminal filling defects. There is a less  than 20% stenosis by the NASCET criteria.  More distally the left internal carotid artery is seen to opacify to the cranial skull base.  There is approximately 50% stenosis of the horizontal petrous segment of the left internal carotid artery secondary to a circumferential plaque. Distal to this the petrous, the cavernous and the supraclinoid segments are widely patent.  The left middle cerebral artery and the left anterior cerebral artery opacify into the capillary and venous phases. A left posterior communicating artery is seen opacifying the  left posterior cerebral artery.  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  Occluded left vertebral artery proximally with distal reconstitution of the left vertebrobasilar junction to the left posterior-inferior cerebellar artery from the right vertebral artery retrogradely.  Approximately 50% stenosis of the left common carotid artery proximal to the bifurcation, and of approximately 50% stenosis of the left internal carotid artery in the horizontal petrous segment secondary to a circumferential atherosclerotic plaque.  PLAN: Findings reviewed with the patient and the patient's spouse. Patient started on Plavix 75 mg a day in addition to the aspirin 81 mg a day. Endovascular revascularization of the left internal carotid artery to prevent catastrophic right cerebral hemispheric ischemic stroke reviewed with the patient and the patient's spouse.   Electronically Signed   By: Luanne Bras M.D.   On: 09/12/2019 15:16  Ir Angio Vertebral Sel Vertebral Uni R Mod Sed  Result Date: 09/15/2019 CLINICAL DATA:  Recent history of syncope. 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 Abnormal ultrasound of the carotids indicative of bilateral internal carotid artery significant stenosis. EXAM: BILATERAL COMMON CAROTID AND INNOMINATE ANGIOGRAPHY; IR ANGIO VERTEBRAL SEL VERTEBRAL UNI RIGHT MOD SED; IR ULTRASOUND GUIDANCE VASC ACCESS RIGHT COMPARISON:  CT angiogram of the head and neck of September 08, 2019. Ultrasound of the carotids of September 07, 2019. MEDICATIONS: Heparin 2000 units intra-arterial. No antibiotic was administered within 1 hour of the procedure. ANESTHESIA/SEDATION: Versed 1 mg IV; Fentanyl 25 mcg IV Moderate Sedation Time:  44 minutes The patient was continuously monitored during the procedure by the interventional radiology nurse under my  direct supervision. CONTRAST:  Isovue 300 approximately 60 mL. FLUOROSCOPY TIME:  Fluoroscopy Time: 18 minutes 6 seconds (791 mGy). COMPLICATIONS: None immediate. TECHNIQUE: Informed written consent was obtained from the patient after a thorough discussion of the procedural risks, benefits and alternatives. All questions were addressed. Maximal Sterile Barrier Technique was utilized including caps, mask, sterile gowns, sterile gloves, sterile drape, hand hygiene and skin antiseptic. A timeout was performed prior to the initiation of the procedure. The right forearm to the wrist was prepped and draped in the usual sterile fashion. The right radial artery was then palpated. A dorsal palmar anastomosis was then verified successfully. Right radial artery was then interrogated with ultrasound and its morphology documented with ultrasound imaging. Using ultrasound guidance, transradial access into the right radial artery was obtained using micropuncture set over a 0.018 inch micro guidewire. Over the micro guidewire, a 4/5 French radial sheath was then inserted without difficulty. The obturator and the guidewire were removed. Good aspiration was obtained from the side port of radial sheath. A diluted cocktail of 2.5% verapamil, 2000 units of heparin, and 200 mcg of nitroglycerin were then injected through the sheath without event. A right radial artery angiogram was then performed. Under intermittent fluoroscopy, over a 0.035 inch Roadrunner guidewire, a  5 French Simmons 2 catheter was then advanced to the aortic arch region, with selective cannulations performed of the right vertebral artery, the right common carotid artery, the left common carotid artery, and the left subclavian artery. Following the procedure, the radial sheath was removed with hemostasis obtained with a wrist band. Distal radial pulse was verified to be present distal to the wrist band. FINDINGS: Origin of the dominant right vertebral artery is widely  patent. The vessel is seen to opacify to the cranial skull base. Wide patency is seen of the right vertebrobasilar junction and the right posterior-inferior cerebellar artery. The basilar artery, the right posterior cerebral artery, the superior cerebellar arteries and the anterior-inferior cerebellar arteries opacify into the capillary and venous phases. There is retrograde opacification of the left vertebrobasilar junction to the left posterior-inferior cerebellar artery with no significant antegrade clearance from the left vertebrobasilar junction. Nonvisualization of the left posterior cerebral artery is probably related to inflow of unopacified blood via the left posterior communicating artery. The right common carotid arteriogram demonstrates a moderate stenosis at the origin of the right external carotid artery. Its branches opacify,however. The right internal carotid artery at the bulb demonstrates severe approximately 90% stenosis secondary to a circumferential smooth plaque. No intraluminal filling defects or ulcerations is seen. More distally the right internal carotid artery is seen to opacify to the cranial skull base. Wide patency is seen of the petrous cavernous and supraclinoid segments. The right middle cerebral artery and the right anterior cerebral artery opacify into the capillary and venous phases. The left subclavian arteriogram demonstrates an occluded left vertebral artery proximally. No distal reconstitution of the left vertebral artery is seen from the thyrocervical trunk branches. The left common carotid arteriogram demonstrates diffuse mild-to-moderate atherosclerotic disease of the middle 1/3 to the distal 1/3 of the left common carotid artery with approximately 50% stenosis of the distal left common carotid artery proximal to the left carotid bifurcation. The left external carotid artery demonstrates mild stenosis at its origin. Its branches opacify. The left internal carotid artery at  the bulb has a relatively smooth shallow plaque along the posterior wall with no evidence of ulcerations or of intraluminal filling defects. There is a less than 20% stenosis by the NASCET criteria. More distally the left internal carotid artery is seen to opacify to the cranial skull base. There is approximately 50% stenosis of the horizontal petrous segment of the left internal carotid artery secondary to a circumferential plaque. Distal to this the petrous, the cavernous and the supraclinoid segments are widely patent. The left middle cerebral artery and the left anterior cerebral artery opacify into the capillary and venous phases. A left posterior communicating artery is seen opacifying the left posterior cerebral artery. 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 Occluded left vertebral artery proximally with distal reconstitution of the left vertebrobasilar junction to the left posterior-inferior cerebellar artery from the right vertebral artery retrogradely. Approximately 50% stenosis of the left common carotid artery proximal to the bifurcation, and of approximately 50% stenosis of the left internal carotid artery in the horizontal petrous segment secondary to a circumferential atherosclerotic plaque. PLAN: Findings reviewed with the patient and the patient's spouse. Patient started on Plavix 75 mg a day in addition to the aspirin 81 mg a day. Endovascular revascularization of the left internal carotid artery to prevent catastrophic right cerebral hemispheric ischemic stroke reviewed with the patient and the patient's spouse. Electronically Signed  By: Luanne Bras M.D.   On: 09/12/2019 15:16    Treatments: Endovascular revascularization of proximal right ICA stenosis using stent assisted angioplasty.  Discharge Exam: Blood pressure (!) 105/49, pulse (!) 54, temperature 98.4 F (36.9 C), temperature source Oral,  resp. rate 19, height 5\' 10"  (1.778 m), weight 178 lb 5.6 oz (80.9 kg), SpO2 91 %. Physical Exam Vitals signs and nursing note reviewed.  Constitutional:      General: He is not in acute distress.    Appearance: Normal appearance.  Cardiovascular:     Rate and Rhythm: Normal rate and regular rhythm.     Heart sounds: Normal heart sounds. No murmur.  Pulmonary:     Effort: Pulmonary effort is normal. No respiratory distress.     Breath sounds: Normal breath sounds. No wheezing.  Skin:    General: Skin is warm and dry.     Comments: Right groin incision soft without active bleeding or hematoma.  Neurological:     Mental Status: He is alert.     Comments: Alert, awake, and oriented x3. Speech and comprehension intact. PERRL bilaterally. No facial asymmetry. Tongue midline. Motor power symmetric proportional to effort. No pronator drift. Fine motor and coordination intact and symmetric. Distal pulses- left DP 2+, right DP palpable with doppler, bilateral PTs 1+ (no change from prior to procedure).  Psychiatric:        Mood and Affect: Mood normal.        Behavior: Behavior normal.        Thought Content: Thought content normal.        Judgment: Judgment normal.     Disposition: Discharge disposition: 01-Home or Self Care       Discharge Instructions    Call MD for:  difficulty breathing, headache or visual disturbances   Complete by: As directed    Call MD for:  extreme fatigue   Complete by: As directed    Call MD for:  hives   Complete by: As directed    Call MD for:  persistant dizziness or light-headedness   Complete by: As directed    Call MD for:  persistant nausea and vomiting   Complete by: As directed    Call MD for:  redness, tenderness, or signs of infection (pain, swelling, redness, odor or green/yellow discharge around incision site)   Complete by: As directed    Call MD for:  severe uncontrolled pain   Complete by: As directed    Call MD for:   temperature >100.4   Complete by: As directed    Diet - low sodium heart healthy   Complete by: As directed    Discharge instructions   Complete by: As directed    Continue taking Plavix 75 mg once daily. Continue taking Aspirin 81 mg once daily. No bending, stooping, or lifting more than 10 pounds for 2 weeks. No driving self for 2 weeks. Stay hydrated by drinking plenty of water. Discontinue smoking at this time.   Increase activity slowly   Complete by: As directed    Remove dressing in 24 hours   Complete by: As directed    Ok to remove Band-Aid from right groin 24 hours after discharge.     Allergies as of 09/25/2019   No Known Allergies     Medication List    STOP taking these medications   nicotine 21 mg/24hr patch Commonly known as: NICODERM CQ - dosed in mg/24 hours   polyethylene glycol 17 g  packet Commonly known as: MIRALAX / GLYCOLAX   predniSONE 20 MG tablet Commonly known as: Deltasone     TAKE these medications   acetaminophen 325 MG tablet Commonly known as: TYLENOL Take 650 mg by mouth every 6 (six) hours as needed for moderate pain or headache.   albuterol (2.5 MG/3ML) 0.083% nebulizer solution Commonly known as: PROVENTIL Take 3 mLs (2.5 mg total) by nebulization every 6 (six) hours as needed for wheezing or shortness of breath. For breathing   amLODipine 10 MG tablet Commonly known as: NORVASC Take 1 tablet (10 mg total) by mouth daily. For BP   aspirin EC 81 MG tablet Take 1 tablet (81 mg total) by mouth daily.   atenolol 50 MG tablet Commonly known as: TENORMIN Take 1 tablet (50 mg total) by mouth daily. For BP   atorvastatin 40 MG tablet Commonly known as: LIPITOR Take 1 tablet (40 mg total) by mouth every evening. For Cholesterol   clopidogrel 75 MG tablet Commonly known as: PLAVIX Take 75 mg by mouth daily.   folic acid 1 MG tablet Commonly known as: FOLVITE Take 1 tablet (1 mg total) by mouth daily.   methocarbamol 750 MG  tablet Commonly known as: ROBAXIN Take 1 tablet (750 mg total) by mouth 3 (three) times daily. For muscle pain   thiamine 100 MG tablet Take 1 tablet (100 mg total) by mouth daily.   umeclidinium bromide 62.5 MCG/INH Aepb Commonly known as: INCRUSE ELLIPTA Inhale 1 puff into the lungs daily. For breathing      Follow-up Information    Luanne Bras, MD Follow up in 2 week(s).   Specialties: Interventional Radiology, Radiology Why: Please follow-up with Dr. Estanislado Pandy in clinic 2 weeks after discharge. Our office will call you to set up this appointment. Contact information: [place] [address] [phone]            Electronically Signed: Earley Abide, PA-C 09/25/2019, 9:15 AM   I have spent Greater Than 30 Minutes discharging Nathan Hart.

## 2019-10-01 ENCOUNTER — Encounter (HOSPITAL_COMMUNITY): Payer: Self-pay | Admitting: Interventional Radiology

## 2019-10-13 ENCOUNTER — Other Ambulatory Visit: Payer: Self-pay

## 2019-10-13 ENCOUNTER — Ambulatory Visit (HOSPITAL_COMMUNITY)
Admission: RE | Admit: 2019-10-13 | Discharge: 2019-10-13 | Disposition: A | Discharge status: Home / Self Care | Payer: Medicaid Other | Source: Ambulatory Visit | Attending: Student | Admitting: Student

## 2019-10-13 DIAGNOSIS — I6521 Occlusion and stenosis of right carotid artery: Secondary | ICD-10-CM

## 2019-10-13 NOTE — Progress Notes (Signed)
Chief Complaint: Patient was seen in consultation today for proximal right ICA stenosis s/p revascularization.  Referring Physician(s): Denton Brick, Courage  Supervising Physician: Luanne Bras  Patient Status: [facility] - Out-pt  History of Present Illness: Nathan Hart is a 63 y.o. 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 He is know to [facility] and has been followed by Dr. Estanislado Pandy since 9/21020. He first presented to our department at the request of Dr. Denton Brick for management of bilateral ICA stenosis seen on CTA. He underwent an image-guided diagnostic cerebral arteriogram 09/12/2019 by Dr. Estanislado Pandy which confirmed patient's bilateral ICA stenosis, R>L. He then underwent an image-guided cerebral arteriogram with revascularization of his proximal right ICA stenosis using stent assisted angioplasty 09/24/2019 by Dr. Estanislado Pandy. He was discharged home 09/25/2019 in stable condition.  Diagnostic cerebral arteriogram 09/12/2019: 1. Approximately 90% stenosis of the right internal carotid artery proximally at the bulb by the NASCET criteria. 2. Occluded left vertebral artery proximally with distal reconstitution of the left vertebrobasilar junction to the left posterior-inferior cerebellar artery from the right vertebral artery retrogradely. 3. Approximately 50% stenosis of the left common carotid artery proximal to the bifurcation, and of approximately 50% stenosis of the left internal carotid artery in the horizontal petrous segment secondary to a circumferential atherosclerotic plaque.  Patient presents today for follow-up regarding his recent procedure 09/24/2019. Patient awake and alert sitting in chair. Accompanied by daughter. Complains of blind left eye and decreased peripheral vision of right  eye- stable, secondary to macular degeneration, unchanged. States that he has run out of Plavix, requesting refill. Denies headache, weakness, numbness/tingling, dizziness, vision changes, hearing changes, tinnitus, or speech difficulty.  Currently taking Plavix 75 mg once daily and Aspirin 81 mg once daily.   Past Medical History:  Diagnosis Date  . Abnormal peripheral vision of left eye   . Blind right eye   . COPD (chronic obstructive pulmonary disease) (HCC)     home o2 hs and prn  . Dyspnea    with activity  . Empyema lung ([place])   . ETOH abuse   . HTN (hypertension)   . Peripheral vascular disease ([place])   . Pneumonia 2020  . Stroke [facility])     Past Surgical History:  Procedure Laterality Date  . BACK SURGERY     disc lumar x 2   . CLAVICLE SURGERY Right   . IR ANGIO INTRA EXTRACRAN SEL COM CAROTID INNOMINATE BILAT MOD SED  09/12/2019  . IR ANGIO VERTEBRAL SEL SUBCLAVIAN INNOMINATE UNI L MOD SED  09/12/2019  . IR ANGIO VERTEBRAL SEL VERTEBRAL UNI R MOD SED  09/12/2019  . IR INTRAVSC STENT CERV CAROTID W/EMB-PROT MOD SED INCL ANGIO  09/24/2019  . IR US GUIDE VASC ACCESS RIGHT  09/12/2019  . KNEE SURGERY Right     x 3 ACL  . RADIOLOGY WITH ANESTHESIA N/A 09/24/2019   Procedure: IR WITH ANESTHESIA/STENTING;  Surgeon: Luanne Bras, MD;  Location: [place];  Service: Radiology;  Laterality: N/A;  . [place]    Allergies: Patient has no known allergies.  Medications: Prior to Admission medications   Medication Sig Start Date End Date Taking? Authorizing Provider  acetaminophen (TYLENOL) 325 MG tablet Take 650 mg by mouth every 6 (six) hours as needed for moderate pain or headache.  [provider]  albuterol (PROVENTIL) (2.5 MG/3ML) 0.083% nebulizer solution Take 3 mLs (2.5 mg total) by nebulization every 6 (six) hours as needed for wheezing or shortness of breath. For breathing 09/09/19   Roxan Hockey, MD  amLODipine (NORVASC) 10 MG  tablet Take 1 tablet (10 mg total) by mouth daily. For BP 09/10/19   Emokpae, Courage, MD  aspirin EC 81 MG tablet Take 1 tablet (81 mg total) by mouth daily. 09/25/19   Louk, Alexandra M, PA-C  atenolol (TENORMIN) 50 MG tablet Take 1 tablet (50 mg total) by mouth daily. For BP 09/10/19   Emokpae, Courage, MD  atorvastatin (LIPITOR) 40 MG tablet Take 1 tablet (40 mg total) by mouth every evening. For Cholesterol 09/09/19   Roxan Hockey, MD  clopidogrel (PLAVIX) 75 MG tablet Take 75 mg by mouth daily.    [provider]  folic acid (FOLVITE) 1 MG tablet Take 1 tablet (1 mg total) by mouth daily. 09/10/19   Roxan Hockey, MD  methocarbamol (ROBAXIN) 750 MG tablet Take 1 tablet (750 mg total) by mouth 3 (three) times daily. For muscle pain 09/09/19   Roxan Hockey, MD  thiamine 100 MG tablet Take 1 tablet (100 mg total) by mouth daily. 09/10/19   Roxan Hockey, MD  umeclidinium bromide (INCRUSE ELLIPTA) 62.5 MCG/INH AEPB Inhale 1 puff into the lungs daily. For breathing 09/10/19   Roxan Hockey, MD     Family History  Problem Relation Age of Onset  . Cancer Mother     Social History   Socioeconomic History  . Marital status: Legally Separated    Spouse name: Not on file  . Number of children: Not on file  . Years of education: Not on file  . Highest education level: Not on file  Occupational History  . Not on file  Social Needs  . Financial resource strain: Not on file  . Food insecurity    Worry: Not on file    Inability: Not on file  . Transportation needs    Medical: Not on file    Non-medical: Not on file  Tobacco Use  . Smoking status: Current Every Day Smoker    Packs/day: 1.50    Years: 9.00    Pack years: 13.50    Types: Cigarettes  . Smokeless tobacco: Never Used  Substance and Sexual Activity  . Alcohol use: Yes    Alcohol/week: 20.0 standard drinks    Types: 20 Cans of beer per week  . Drug use: Never  . Sexual activity: Not on file  Lifestyle   . Physical activity    Days per week: Not on file    Minutes per session: Not on file  . Stress: Not on file  Relationships  . Social Herbalist on phone: Not on file    Gets together: Not on file    Attends religious service: Not on file    Active member of club or organization: Not on file    Attends meetings of clubs or organizations: Not on file    Relationship status: Not on file  Other Topics Concern  . Not on file  Social History Narrative  . Not on file     Review of Systems: A 12 point ROS discussed and pertinent positives are indicated in the HPI above.  All other systems are negative.  Review of Systems  HENT: Negative for hearing loss and tinnitus.   Eyes: Positive for visual disturbance.  Respiratory: Negative  for shortness of breath and wheezing.   Cardiovascular: Negative for chest pain and palpitations.  Neurological: Negative for dizziness, speech difficulty, weakness and headaches.  Psychiatric/Behavioral: Negative for behavioral problems and confusion.    Physical Exam Constitutional:      General: He is not in acute distress.    Appearance: Normal appearance.  Pulmonary:     Effort: Pulmonary effort is normal. No respiratory distress.  Skin:    General: Skin is warm and dry.  Neurological:     Mental Status: He is alert and oriented to person, place, and time.  Psychiatric:        Mood and Affect: Mood normal.        Behavior: Behavior normal.        Thought Content: Thought content normal.        Judgment: Judgment normal.      Imaging: Ir Sherre Lain Stent Cerv Carotid W/emb-prot Mod Sed  Result Date: 10/01/2019 CLINICAL DATA:  History of severe right internal carotid artery stenosis proximally. Patient legally blind in the left eye severely decreased vision in the left eye. History of severe COPD. EXAM: IR ANGIO VERTEBRAL SEL SUBCLAVIAN INNOMINATE UNI LEFT MOD SED COMPARISON:  CT angiogram of the head and neck of September 08, 2019.  MEDICATIONS: Heparin 3,000 units IV; Ancef 2 g IV antibiotic was administered within 1 hour of the procedure. ANESTHESIA/SEDATION: Mac anesthesia as per the Department of Anesthesiology at [place]:  Isovue 300 approximately 65 mL. FLUOROSCOPY TIME:  Fluoroscopy Time: 34 minutes 50 seconds (820 mGy). COMPLICATIONS: None immediate. TECHNIQUE: Informed written consent was obtained from the patient after a thorough discussion of the procedural risks, benefits and alternatives. All questions were addressed. Maximal Sterile Barrier Technique was utilized including caps, mask, sterile gowns, sterile gloves, sterile drape, hand hygiene and skin antiseptic. A timeout was performed prior to the initiation of the procedure. The right groin was prepped and draped in the usual sterile fashion. Thereafter using modified Seldinger technique, transfemoral access into the right common femoral artery was obtained without difficulty. Over a 0.035 inch guidewire, a 5 French Pinnacle sheath was inserted. Through this, and also over 0.035 inch guidewire, a 5 [place] JB 1 catheter was advanced to the aortic arch region and selectively positioned in the innominate artery, and the right common carotid artery. FINDINGS: Innominate artery angiogram demonstrates the origin of the right common carotid artery and the right vertebral artery to be widely patent. The right vertebral artery is seen to opacify to the cranial skull base. Wide patency is seen of the right vertebrobasilar junction and the right posterior-inferior cerebellar artery. The visualized basilar artery, the posterior cerebral artery, the superior cerebellar arteries and the anterior inferior cerebellar arteries on the lateral projection demonstrate wide patency into the delayed arterial phase. The right common carotid arteriogram demonstrates atherosclerotic disease involving the right common carotid bifurcation. There is a moderately severe stenosis of the  right external carotid artery. Its branches, however, opacify. The right internal carotid artery at the bulb demonstrates severe segmental stenosis, approximately 85-90% secondary to a smooth calcified atherosclerotic plaque without evidence of intraluminal filling defects or of ulcerations. Distal to this the right internal carotid artery is seen to opacify to the cranial skull base. Wide patency is seen of the petrous, cavernous and the supraclinoid right ICA. The right middle and the right anterior cerebral arteries opacify into the capillary and venous phases. There is delayed exit of contrast in the capillary phase of the  right cerebral hemisphere. ENDOVASCULAR RIGHT VASCULARIZATION OF THE SEVERELY STENOTIC RIGHT INTERNAL CAROTID ARTERY PROXIMALLY. The diagnostic JB 1 catheter in the right common carotid artery was exchanged over a 0.035 inch 300 cm Rosen exchange guidewire for an 80 cm 6 [place] Cook shuttle sheath using biplane roadmap technique and constant fluoroscopic guidance. Good aspiration was obtained from the hub of the [facility] shuttle sheath. A gentle contrast injection again demonstrated no evidence of vasospasm, dissections or of intraluminal defects. No change was seen in the severely stenosed right internal carotid artery proximally. Measurements were then performed of the right internal carotid artery and its normal appearance distal to the severe stenosis, and also the right common carotid artery. A 4/7 NAV emboshield filter was then prepped and purged with heparinized saline infusion in its housing. The filter was then cleared of the air and captured into the delivery catheter. The delivery microcatheter with the micro guidewire were then retrieved from the housing. A J configuration was given to the distal end of the 014 inch micro guidewire. Thereafter, using biplane roadmap technique and constant fluoroscopic guidance, in a coaxial manner and with constant heparinized saline infusion, the  combination was navigated to the distal end of the [employer] sheath. Using a torque device, the micro guidewire was then gently advanced without difficulty through severely tight right internal carotid artery proximal stenosis. The combination was then advanced into the horizontal petrous segment. The filter was then deployed in the usual manner in the cervical petrous junction without difficulty. The delivery microcatheter was retrieved and removed. A control arteriogram performed through the [facility] shuttle sheath demonstrated safe positioning of the filter in the distal right internal carotid artery without evidence of dissections or spasm. A 4 mm x 30 mm Viatrac 14 angioplasty microcatheter was then prepped and purged with 50% contrast and 50% heparinized saline infusion. Using the rapid exchange technique, this was again advanced without difficulty and positioned adequate distance at the site of the severe right ICA stenosis. Control inflation was then performed using micro inflation syringe device via micro tubing to approximately 10 atmospheres achieving a diameter of approximately 4.1 mm where it was maintained for approximately 20 seconds. At this time, the patient's heart rate dropped to approximately 30 beats per minute without clinical consequence. This returned to normal on deflation of the balloon catheter and its removal. Control arteriogram performed through the [facility] shuttle sheath following the angioplasty demonstrated moderately improved caliber and flow through the angioplastied segment. At this time, a 6 mm/8 mm x 40 mm Xact stent apparatus was purged with heparinized saline infusion retrogradely. Using the rapid exchange technique, the delivery apparatus of the stent was then advanced to the distal end covering the proximal and the distal portions of the angioplastied segment of the right internal carotid artery. This stent was then deployed in the usual manner without difficulty. The delivery  apparatus was retrieved and removed. Filter wire and the filter remained stable in the distal right internal carotid artery. A control arteriogram performed through the [facility] shuttle sheath into the right common carotid artery demonstrated significantly improved caliber and flow through the stented segment of the right internal carotid artery. There continued to be a moderate waist. This prompted the advancement of a 5 mm x 30 mm Viatrac 14 balloon angioplasty microcatheter which had been prepped and purged as described previously. This was advanced again using the rapid exchange technique and positioned with the distal and proximal markers adequate distance from the site of the  waist. Slow control inflation was then performed using a micro inflation syringe device and micro tubing. Inflation was continued to just over 8 atmospheres where it was maintained for approximately 30 seconds. Again there was slow decrease in the heart rate to the mid 30s without clinical consequence. The deflated balloon was then retrieved and removed. A control arteriogram performed through the [facility] shuttle sheath demonstrated excellent flow and caliber of the angioplastied stented segment of the right internal carotid artery. A 4/7 filter capture catheter which had been prepped with heparinized saline infusion was then advanced over the exchange micro guidewire without difficulty to the proximal marker of the filter device. The filter was captured into the filter capture microcatheter by pulling on the micro guidewire. Once completely captured, the micro filter with the filter capture device was then retrieved and removed. A control arteriogram performed through the [facility] shuttle sheath in the right common carotid artery demonstrated excellent flow through the stented segment of the right internal carotid artery with near normal caliber. Intracranially no evidence of intraluminal filling defects or of occlusion was seen. Control  arteriogram was then performed at 15 and 30 minutes after the second angioplasty. These continued to demonstrate excellent flow through the stented segment without evidence of intra stent irregularities, or of intracranial filling defects or of occlusions. During this time, the patient remained asymptomatic. Patient's ACT was maintained in the region of approximately 109 seconds throughout the procedure. The [facility] shuttle sheath was then retrieved into the abdominal aorta and exchanged over a J-tip guidewire for 6 French Pinnacle sheath which in turn was then removed with manual compression held in the right groin for hemostasis. Distal pulses remained palpable in the dorsalis pedis and posterior tibial on the left, and palpable posterior tibial and Dopplerable dorsalis pedis on the right side unchanged from prior to the procedure. Patient was then transferred to the recovery and then to neuro ICU to continue with low-dose IV heparin, close neurologic observations and control of the patient's blood pressure. Overnight the patient had no clinical or systemic issues. The following day the IV heparin was stopped and the patient was switched to aspirin 81 mg a day, and Plavix 75 mg a day. The patient's right groin remained soft with no change in his distal pulses. He was able to the eat independently and ambulate prior to discharge home under the care of his spouse. The patient was instructed to maintain adequate hydration by drinking water and to continue his home meds including the aspirin and Plavix. He was advised to refrain from driving, or stooping or lifting weights above 10 pounds for 2 weeks. Should the patient develop symptoms of left-sided numbness, tingling, facial droop, speech difficulties or gait abnormalities he was advised to call [phone]. Patient expressed understanding and agreement with the above management plan. IMPRESSION: Status post endovascular revascularization of severely stenosed right internal  carotid proximally with stent assisted angioplasty with distal protection as described without event. PLAN: Follow-up in clinic in 2 weeks time. Electronically Signed   By: Luanne Bras M.D.   On: 09/25/2019 13:57    Labs:  CBC: Recent Labs    09/08/19 0429 09/12/19 1108 09/24/19 0623 09/25/19 0500  WBC 8.0 8.6 6.9 4.4  HGB 18.1* 17.8* 16.9 12.5*  HCT 53.1* 49.3 49.9 34.9*  PLT 142* 162 173 126*    COAGS: Recent Labs    09/12/19 1108 09/24/19 0623  INR 1.1 1.1    BMP: Recent Labs    09/08/19 0429  09/12/19 1108 09/24/19 0623 09/25/19 0500  NA 139 137 137 139  K 3.9 3.7 3.6 3.9  CL 102 103 108 109  CO2 27 26 19* 22  GLUCOSE 215* 117* 134* 111*  BUN 13 15 7* 9  CALCIUM 8.9 8.4* 8.4* 8.2*  CREATININE 0.69 0.81 0.62 0.69  GFRNONAA >60 >60 >60 >60  GFRAA >60 >60 >60 >60    LIVER FUNCTION TESTS: Recent Labs    09/07/19 0140 09/07/19 0747 09/08/19 0429  BILITOT 1.0 1.1 1.6*  AST 56* 52* 41  ALT 64* 63* 52*  ALKPHOS 62 49 49  PROT 7.8 8.0 7.6  ALBUMIN 4.1 4.0 3.8     Assessment and Plan:  Proximal right ICA stenosis s/p revascularization using stent assisted angioplasty 09/24/2019 by Dr. Estanislado Pandy. Informed patient and daughter that Dr. Estanislado Pandy was in an emergent Code Stroke procedure that required his immediate attention at this time, therefore their consult will be with me today. They understand and agree. Discussed patient's post-procedure course. Patient states he is grossly asymptomatic at this time (no change to vision at this time). Explained the best way to manage his stent is with routine imaging scans to monitor for changes, first to be 4 months from procedure 09/24/2019. Explained this will also look at his left ICA stenosis.  Plan for follow-up with carotid US 4 months from procedure 09/24/2019. Informed patient that our schedulers will call him to set up this imaging scan. Instructed patient to continue taking Plavix 75 mg once daily and  Aspirin 81 mg once daily. Patient requesting Plavix refill. [facility] [phone]) at [phone] to fill prescription- Plavix 75 mg tablets, take one tablet by mouth once daily, dispense 30 tablets with 3 refills.  All questions answered and concerns addressed. Patient and daughter convey understanding and agree with plan.  Thank you for this interesting consult.  I greatly enjoyed meeting Nathan Hart and look forward to participating in their care.  A copy of this report was sent to the requesting provider on this date.  Electronically Signed: Earley Abide, PA-C 10/13/2019, 8:45 AM   I spent a total of 40 Minutes in face to face in clinical consultation, greater than 50% of which was counseling/coordinating care for proximal right ICA stenosis s/p revascularization.

## 2020-01-26 ENCOUNTER — Telehealth (HOSPITAL_COMMUNITY): Payer: Self-pay

## 2020-01-26 NOTE — Telephone Encounter (Signed)
Called to schedule us carotid, no answer, no vm. AW 

## 2020-03-30 ENCOUNTER — Telehealth (HOSPITAL_COMMUNITY): Payer: Self-pay

## 2020-03-30 NOTE — Telephone Encounter (Signed)
Called to schedule f/u US carotid. Pt no longer lives here. He moved back to [place]. AW

## 2020-08-02 ENCOUNTER — Emergency Department (HOSPITAL_COMMUNITY): Payer: Medicare Other

## 2020-08-02 ENCOUNTER — Other Ambulatory Visit: Payer: Self-pay

## 2020-08-02 ENCOUNTER — Observation Stay (HOSPITAL_COMMUNITY): Payer: Medicare Other

## 2020-08-02 ENCOUNTER — Inpatient Hospital Stay (HOSPITAL_COMMUNITY)
Admission: EM | Admit: 2020-08-02 | Discharge: 2020-08-05 | DRG: 065 | Disposition: A | Discharge status: Rehabilitation Facility | Payer: Medicare Other | Attending: Internal Medicine | Admitting: Internal Medicine

## 2020-08-02 ENCOUNTER — Ambulatory Visit (HOSPITAL_COMMUNITY)
Admission: EM | Admit: 2020-08-02 | Discharge: 2020-08-02 | Disposition: A | Discharge status: Home / Self Care | Payer: Medicare Other

## 2020-08-02 DIAGNOSIS — Z9981 Dependence on supplemental oxygen: Secondary | ICD-10-CM | POA: Diagnosis not present

## 2020-08-02 DIAGNOSIS — I69351 Hemiplegia and hemiparesis following cerebral infarction affecting right dominant side: Secondary | ICD-10-CM | POA: Diagnosis not present

## 2020-08-02 DIAGNOSIS — E785 Hyperlipidemia, unspecified: Secondary | ICD-10-CM | POA: Diagnosis present

## 2020-08-02 DIAGNOSIS — G8191 Hemiplegia, unspecified affecting right dominant side: Secondary | ICD-10-CM | POA: Diagnosis present

## 2020-08-02 DIAGNOSIS — F17210 Nicotine dependence, cigarettes, uncomplicated: Secondary | ICD-10-CM | POA: Diagnosis present

## 2020-08-02 DIAGNOSIS — F0390 Unspecified dementia without behavioral disturbance: Secondary | ICD-10-CM | POA: Diagnosis present

## 2020-08-02 DIAGNOSIS — Z8673 Personal history of transient ischemic attack (TIA), and cerebral infarction without residual deficits: Secondary | ICD-10-CM

## 2020-08-02 DIAGNOSIS — K264 Chronic or unspecified duodenal ulcer with hemorrhage: Secondary | ICD-10-CM | POA: Diagnosis not present

## 2020-08-02 DIAGNOSIS — C7800 Secondary malignant neoplasm of unspecified lung: Secondary | ICD-10-CM | POA: Diagnosis not present

## 2020-08-02 DIAGNOSIS — H5347 Heteronymous bilateral field defects: Secondary | ICD-10-CM | POA: Diagnosis present

## 2020-08-02 DIAGNOSIS — I6522 Occlusion and stenosis of left carotid artery: Secondary | ICD-10-CM | POA: Diagnosis present

## 2020-08-02 DIAGNOSIS — I639 Cerebral infarction, unspecified: Secondary | ICD-10-CM | POA: Diagnosis present

## 2020-08-02 DIAGNOSIS — R29707 NIHSS score 7: Secondary | ICD-10-CM | POA: Diagnosis present

## 2020-08-02 DIAGNOSIS — Z79899 Other long term (current) drug therapy: Secondary | ICD-10-CM | POA: Diagnosis not present

## 2020-08-02 DIAGNOSIS — I739 Peripheral vascular disease, unspecified: Secondary | ICD-10-CM | POA: Diagnosis not present

## 2020-08-02 DIAGNOSIS — I63132 Cerebral infarction due to embolism of left carotid artery: Secondary | ICD-10-CM | POA: Diagnosis not present

## 2020-08-02 DIAGNOSIS — R4701 Aphasia: Secondary | ICD-10-CM | POA: Diagnosis not present

## 2020-08-02 DIAGNOSIS — H3530 Unspecified macular degeneration: Secondary | ICD-10-CM | POA: Diagnosis present

## 2020-08-02 DIAGNOSIS — F1910 Other psychoactive substance abuse, uncomplicated: Secondary | ICD-10-CM

## 2020-08-02 DIAGNOSIS — I951 Orthostatic hypotension: Secondary | ICD-10-CM

## 2020-08-02 DIAGNOSIS — I10 Essential (primary) hypertension: Secondary | ICD-10-CM

## 2020-08-02 DIAGNOSIS — I63232 Cerebral infarction due to unspecified occlusion or stenosis of left carotid arteries: Secondary | ICD-10-CM | POA: Diagnosis not present

## 2020-08-02 DIAGNOSIS — D649 Anemia, unspecified: Secondary | ICD-10-CM | POA: Diagnosis not present

## 2020-08-02 DIAGNOSIS — G459 Transient cerebral ischemic attack, unspecified: Secondary | ICD-10-CM | POA: Diagnosis not present

## 2020-08-02 DIAGNOSIS — I63512 Cerebral infarction due to unspecified occlusion or stenosis of left middle cerebral artery: Secondary | ICD-10-CM | POA: Diagnosis present

## 2020-08-02 DIAGNOSIS — I63532 Cerebral infarction due to unspecified occlusion or stenosis of left posterior cerebral artery: Secondary | ICD-10-CM | POA: Diagnosis present

## 2020-08-02 DIAGNOSIS — F1020 Alcohol dependence, uncomplicated: Secondary | ICD-10-CM | POA: Diagnosis present

## 2020-08-02 DIAGNOSIS — K769 Liver disease, unspecified: Secondary | ICD-10-CM | POA: Diagnosis not present

## 2020-08-02 DIAGNOSIS — H548 Legal blindness, as defined in USA: Secondary | ICD-10-CM | POA: Diagnosis present

## 2020-08-02 DIAGNOSIS — K922 Gastrointestinal hemorrhage, unspecified: Secondary | ICD-10-CM | POA: Diagnosis not present

## 2020-08-02 DIAGNOSIS — F0150 Vascular dementia without behavioral disturbance: Secondary | ICD-10-CM | POA: Diagnosis not present

## 2020-08-02 DIAGNOSIS — R2990 Unspecified symptoms and signs involving the nervous system: Secondary | ICD-10-CM | POA: Diagnosis not present

## 2020-08-02 DIAGNOSIS — I6340 Cerebral infarction due to embolism of unspecified cerebral artery: Secondary | ICD-10-CM | POA: Insufficient documentation

## 2020-08-02 DIAGNOSIS — I6389 Other cerebral infarction: Secondary | ICD-10-CM | POA: Diagnosis not present

## 2020-08-02 DIAGNOSIS — J449 Chronic obstructive pulmonary disease, unspecified: Secondary | ICD-10-CM | POA: Diagnosis present

## 2020-08-02 DIAGNOSIS — F17200 Nicotine dependence, unspecified, uncomplicated: Secondary | ICD-10-CM | POA: Diagnosis not present

## 2020-08-02 DIAGNOSIS — R29898 Other symptoms and signs involving the musculoskeletal system: Secondary | ICD-10-CM

## 2020-08-02 DIAGNOSIS — K269 Duodenal ulcer, unspecified as acute or chronic, without hemorrhage or perforation: Secondary | ICD-10-CM | POA: Diagnosis not present

## 2020-08-02 DIAGNOSIS — Z20822 Contact with and (suspected) exposure to covid-19: Secondary | ICD-10-CM | POA: Diagnosis present

## 2020-08-02 DIAGNOSIS — E7800 Pure hypercholesterolemia, unspecified: Secondary | ICD-10-CM | POA: Diagnosis not present

## 2020-08-02 DIAGNOSIS — Z515 Encounter for palliative care: Secondary | ICD-10-CM | POA: Diagnosis not present

## 2020-08-02 DIAGNOSIS — Z7189 Other specified counseling: Secondary | ICD-10-CM | POA: Diagnosis not present

## 2020-08-02 DIAGNOSIS — R918 Other nonspecific abnormal finding of lung field: Secondary | ICD-10-CM | POA: Diagnosis not present

## 2020-08-02 DIAGNOSIS — C787 Secondary malignant neoplasm of liver and intrahepatic bile duct: Secondary | ICD-10-CM | POA: Diagnosis not present

## 2020-08-02 DIAGNOSIS — D62 Acute posthemorrhagic anemia: Secondary | ICD-10-CM | POA: Diagnosis not present

## 2020-08-02 DIAGNOSIS — I6521 Occlusion and stenosis of right carotid artery: Secondary | ICD-10-CM | POA: Diagnosis not present

## 2020-08-02 LAB — I-STAT CHEM 8, ED
BUN: 10 mg/dL (ref 8–23)
Calcium, Ion: 1.09 mmol/L — ABNORMAL LOW (ref 1.15–1.40)
Chloride: 103 mmol/L (ref 98–111)
Creatinine, Ser: 0.5 mg/dL — ABNORMAL LOW (ref 0.61–1.24)
Glucose, Bld: 131 mg/dL — ABNORMAL HIGH (ref 70–99)
HCT: 37 % — ABNORMAL LOW (ref 39.0–52.0)
Hemoglobin: 12.6 g/dL — ABNORMAL LOW (ref 13.0–17.0)
Potassium: 4 mmol/L (ref 3.5–5.1)
Sodium: 140 mmol/L (ref 135–145)
TCO2: 23 mmol/L (ref 22–32)

## 2020-08-02 LAB — COMPREHENSIVE METABOLIC PANEL
ALT: 29 U/L (ref 0–44)
AST: 28 U/L (ref 15–41)
Albumin: 3.4 g/dL — ABNORMAL LOW (ref 3.5–5.0)
Alkaline Phosphatase: 68 U/L (ref 38–126)
Anion gap: 10 (ref 5–15)
BUN: 10 mg/dL (ref 8–23)
CO2: 25 mmol/L (ref 22–32)
Calcium: 8.8 mg/dL — ABNORMAL LOW (ref 8.9–10.3)
Chloride: 103 mmol/L (ref 98–111)
Creatinine, Ser: 0.64 mg/dL (ref 0.61–1.24)
GFR calc Af Amer: >60 mL/min (ref >60)
GFR calc non Af Amer: >60 mL/min (ref >60)
Glucose, Bld: 131 mg/dL — ABNORMAL HIGH (ref 70–99)
Potassium: 4 mmol/L (ref 3.5–5.1)
Sodium: 138 mmol/L (ref 135–145)
Total Bilirubin: 1.3 mg/dL — ABNORMAL HIGH (ref 0.3–1.2)
Total Protein: 7.3 g/dL (ref 6.5–8.1)

## 2020-08-02 LAB — DIFFERENTIAL
Abs Immature Granulocytes: 0.02 10*3/uL (ref 0.00–0.07)
Basophils Absolute: 0.1 10*3/uL (ref 0.0–0.1)
Basophils Relative: 1 %
Eosinophils Absolute: 0.2 10*3/uL (ref 0.0–0.5)
Eosinophils Relative: 2 %
Immature Granulocytes: 0 %
Lymphocytes Relative: 12 %
Lymphs Abs: 1 10*3/uL (ref 0.7–4.0)
Monocytes Absolute: 0.5 10*3/uL (ref 0.1–1.0)
Monocytes Relative: 6 %
Neutro Abs: 6.3 10*3/uL (ref 1.7–7.7)
Neutrophils Relative %: 79 %

## 2020-08-02 LAB — URINALYSIS, ROUTINE W REFLEX MICROSCOPIC
Bilirubin Urine: NEGATIVE
Glucose, UA: NEGATIVE mg/dL
Hgb urine dipstick: NEGATIVE
Ketones, ur: NEGATIVE mg/dL
Leukocytes,Ua: NEGATIVE
Nitrite: NEGATIVE
Protein, ur: NEGATIVE mg/dL
Specific Gravity, Urine: 1.039 — ABNORMAL HIGH (ref 1.005–1.030)
pH: 6 (ref 5.0–8.0)

## 2020-08-02 LAB — CBC
HCT: 39.5 % (ref 39.0–52.0)
Hemoglobin: 11 g/dL — ABNORMAL LOW (ref 13.0–17.0)
MCH: 20.4 pg — ABNORMAL LOW (ref 26.0–34.0)
MCHC: 27.8 g/dL — ABNORMAL LOW (ref 30.0–36.0)
MCV: 73.3 fL — ABNORMAL LOW (ref 80.0–100.0)
Platelets: 291 10*3/uL (ref 150–400)
RBC: 5.39 MIL/uL (ref 4.22–5.81)
RDW: 19 % — ABNORMAL HIGH (ref 11.5–15.5)
WBC: 8.1 10*3/uL (ref 4.0–10.5)
nRBC: 0 % (ref 0.0–0.2)

## 2020-08-02 LAB — APTT: aPTT: 29 seconds (ref 24–36)

## 2020-08-02 LAB — RAPID URINE DRUG SCREEN, HOSP PERFORMED
Amphetamines: NOT DETECTED
Barbiturates: NOT DETECTED
Benzodiazepines: NOT DETECTED
Cocaine: NOT DETECTED
Opiates: NOT DETECTED
Tetrahydrocannabinol: NOT DETECTED

## 2020-08-02 LAB — CBG MONITORING, ED: Glucose-Capillary: 126 mg/dL — ABNORMAL HIGH (ref 70–99)

## 2020-08-02 LAB — PROTIME-INR
INR: 1.1 (ref 0.8–1.2)
Prothrombin Time: 14 seconds (ref 11.4–15.2)

## 2020-08-02 LAB — ETHANOL: Alcohol, Ethyl (B): <10 mg/dL (ref <10)

## 2020-08-02 LAB — SARS CORONAVIRUS 2 BY RT PCR (HOSPITAL ORDER, PERFORMED IN ~~LOC~~ HOSPITAL LAB): SARS Coronavirus 2: NEGATIVE

## 2020-08-02 MED ORDER — SODIUM CHLORIDE 0.9 % IV SOLN
INTRAVENOUS | Status: DC
  Administered 2020-08-02: 75 mL/h via INTRAVENOUS

## 2020-08-02 MED ORDER — ACETAMINOPHEN 650 MG RE SUPP: 650.0000 mg | RECTAL | Status: DC | PRN

## 2020-08-02 MED ORDER — ENOXAPARIN SODIUM 40 MG/0.4ML ~~LOC~~ SOLN
40.0000 mg | SUBCUTANEOUS | Status: DC
  Administered 2020-08-02 – 2020-08-04 (×3): 40 mg via SUBCUTANEOUS
  Filled 2020-08-02 (×3): qty 0.4

## 2020-08-02 MED ORDER — ATORVASTATIN CALCIUM 80 MG PO TABS
80.0000 mg | ORAL_TABLET | Freq: Every day | ORAL | Status: DC
  Administered 2020-08-02 – 2020-08-05 (×4): 80 mg via ORAL
  Filled 2020-08-02 (×4): qty 1

## 2020-08-02 MED ORDER — ACETAMINOPHEN 160 MG/5ML PO SOLN: 650.0000 mg | ORAL | Status: DC | PRN

## 2020-08-02 MED ORDER — LORAZEPAM 2 MG/ML IJ SOLN: 1.0000 mg | INTRAMUSCULAR | Status: DC | PRN

## 2020-08-02 MED ORDER — ASPIRIN EC 325 MG PO TBEC
325.0000 mg | DELAYED_RELEASE_TABLET | Freq: Every day | ORAL | Status: DC
  Administered 2020-08-02 – 2020-08-05 (×4): 325 mg via ORAL
  Filled 2020-08-02 (×4): qty 1

## 2020-08-02 MED ORDER — FOLIC ACID 1 MG PO TABS
1.0000 mg | ORAL_TABLET | Freq: Every day | ORAL | Status: DC
  Administered 2020-08-03 – 2020-08-05 (×3): 1 mg via ORAL
  Filled 2020-08-02 (×3): qty 1

## 2020-08-02 MED ORDER — LORAZEPAM 1 MG PO TABS: 1.0000 mg | ORAL_TABLET | ORAL | Status: DC | PRN

## 2020-08-02 MED ORDER — ASPIRIN EC 325 MG PO TBEC: 325.0000 mg | DELAYED_RELEASE_TABLET | Freq: Every day | ORAL | Status: DC

## 2020-08-02 MED ORDER — THIAMINE HCL 100 MG/ML IJ SOLN: 100.0000 mg | Freq: Every day | INTRAMUSCULAR | Status: DC

## 2020-08-02 MED ORDER — CLOPIDOGREL BISULFATE 75 MG PO TABS
75.0000 mg | ORAL_TABLET | Freq: Every day | ORAL | Status: DC
  Administered 2020-08-02 – 2020-08-05 (×4): 75 mg via ORAL
  Filled 2020-08-02 (×5): qty 1

## 2020-08-02 MED ORDER — ADULT MULTIVITAMIN W/MINERALS CH
1.0000 | ORAL_TABLET | Freq: Every day | ORAL | Status: DC
  Administered 2020-08-02 – 2020-08-05 (×4): 1 via ORAL
  Filled 2020-08-02 (×4): qty 1

## 2020-08-02 MED ORDER — ASPIRIN 300 MG RE SUPP: 300.0000 mg | Freq: Every day | RECTAL | Status: DC

## 2020-08-02 MED ORDER — ACETAMINOPHEN 325 MG PO TABS
650.0000 mg | ORAL_TABLET | ORAL | Status: DC | PRN
  Administered 2020-08-05: 650 mg via ORAL
  Filled 2020-08-02: qty 2

## 2020-08-02 MED ORDER — LORAZEPAM 2 MG/ML IJ SOLN: 1.0000 mg | Freq: Once | INTRAMUSCULAR | Status: DC | PRN

## 2020-08-02 MED ORDER — SENNOSIDES-DOCUSATE SODIUM 8.6-50 MG PO TABS: 1.0000 | ORAL_TABLET | Freq: Every evening | ORAL | Status: DC | PRN

## 2020-08-02 MED ORDER — STROKE: EARLY STAGES OF RECOVERY BOOK: Freq: Once | Status: AC

## 2020-08-02 MED ORDER — STROKE: EARLY STAGES OF RECOVERY BOOK
Freq: Once | Status: AC
  Filled 2020-08-02: qty 1

## 2020-08-02 MED ORDER — THIAMINE HCL 100 MG PO TABS
100.0000 mg | ORAL_TABLET | Freq: Every day | ORAL | Status: DC
  Administered 2020-08-02 – 2020-08-05 (×4): 100 mg via ORAL
  Filled 2020-08-02 (×4): qty 1

## 2020-08-02 MED ORDER — IOHEXOL 350 MG/ML SOLN
100.0000 mL | Freq: Once | INTRAVENOUS | Status: AC | PRN
  Administered 2020-08-02: 100 mL via INTRAVENOUS

## 2020-08-02 MED ORDER — LABETALOL HCL 5 MG/ML IV SOLN: 10.0000 mg | Freq: Four times a day (QID) | INTRAVENOUS | Status: DC | PRN

## 2020-08-02 NOTE — ED Provider Notes (Signed)
[facility] EMERGENCY DEPARTMENT Provider Note   CSN: [id number] Arrival date & time: 08/02/20  1309     History Chief Complaint  Patient presents with  . Code Stroke    Nathan Hart is a 64 y.o. male with a history of COPD, hypertension, EtOH abuse, prior stroke, & prior right ICA stenosis S/p stenting who presents to the ED from urgent care activated as code stroke per triage. Per patient's son at bedside who provides primary history, last night the patient was complaining of some right upper extremity numbness, this seem to resolve and the patient was able to go to sleep.  This morning he woke up fairly normal, he was last seen normal at 10 AM, and subsequently found with balance difficulty and right-sided weakness.  Patient son states that he heard a loud noise upstairs and came to find the patient falling trying to get his pants on with significant RUE/RLE weakness.  He took him to urgent care and subsequently the emergency department.  He seems to have some improving his weakness since arrival.  Patient agrees with above history.  He denies any pain currently.  No alleviating or aggravating factors.  They deny fever, chest pain, trouble breathing, abdominal pain, vomiting, or syncope.  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  HPI     Past Medical History:  Diagnosis Date  . Abnormal peripheral vision of left eye   . Blind right eye   . COPD (chronic obstructive pulmonary disease) (HCC)     home o2 hs and prn  . Dyspnea    with activity  . Empyema lung ([place])   . ETOH abuse   . HTN (hypertension)   . Peripheral vascular disease ([place])   . Pneumonia 2020  . Stroke [facility])     Patient Active Problem List   Diagnosis Date Noted  . Carotid stenosis, symptomatic w/o infarct 09/24/2019  . PAD (peripheral artery disease) /Bil ICA Stenosis 09/08/2019  . Delirium tremens ([place]) 09/08/2019  . COPD exacerbation ([place])  09/07/2019  . Chest wall pain 09/07/2019  . Syncope 09/07/2019  . Community acquired pneumonia 09/07/2019  . Abnormal liver function/Alcoholic Hepatitis 19/62/2297    Past Surgical History:  Procedure Laterality Date  . BACK SURGERY     disc lumar x 2   . CLAVICLE SURGERY Right   . IR ANGIO INTRA EXTRACRAN SEL COM CAROTID INNOMINATE BILAT MOD SED  09/12/2019  . IR ANGIO VERTEBRAL SEL SUBCLAVIAN INNOMINATE UNI L MOD SED  09/12/2019  . IR ANGIO VERTEBRAL SEL VERTEBRAL UNI R MOD SED  09/12/2019  . IR INTRAVSC STENT CERV CAROTID W/EMB-PROT MOD SED INCL ANGIO  09/24/2019  . IR US GUIDE VASC ACCESS RIGHT  09/12/2019  . KNEE SURGERY Right     x 3 ACL  . RADIOLOGY WITH ANESTHESIA N/A 09/24/2019   Procedure: IR WITH ANESTHESIA/STENTING;  Surgeon: Luanne Bras, MD;  Location: [place];  Service: Radiology;  Laterality: N/A;  . [place], [place]       Family History  Problem Relation Age of Onset  . Cancer Mother     Social History   Tobacco Use  . Smoking status: Current Every Day Smoker    Packs/day: 1.50    Years: 9.00    Pack years: 13.50    Types: Cigarettes  . Smokeless tobacco: Never Used  Vaping Use  . Vaping Use: Never  used  Substance Use Topics  . Alcohol use: Yes    Alcohol/week: 20.0 standard drinks    Types: 20 Cans of beer per week  . Drug use: Never    Home Medications Prior to Admission medications   Medication Sig Start Date End Date Taking? Authorizing Provider  acetaminophen (TYLENOL) 325 MG tablet Take 650 mg by mouth every 6 (six) hours as needed for moderate pain or headache.    [provider]  albuterol (PROVENTIL) (2.5 MG/3ML) 0.083% nebulizer solution Take 3 mLs (2.5 mg total) by nebulization every 6 (six) hours as needed for wheezing or shortness of breath. For breathing 09/09/19   Roxan Hockey, MD  amLODipine (NORVASC) 10 MG tablet Take 1 tablet (10 mg total) by mouth daily. For BP 09/10/19   Emokpae, Courage, MD    aspirin EC 81 MG tablet Take 1 tablet (81 mg total) by mouth daily. 09/25/19   Louk, Alexandra M, PA-C  atenolol (TENORMIN) 50 MG tablet Take 1 tablet (50 mg total) by mouth daily. For BP 09/10/19   Emokpae, Courage, MD  atorvastatin (LIPITOR) 40 MG tablet Take 1 tablet (40 mg total) by mouth every evening. For Cholesterol 09/09/19   Roxan Hockey, MD  clopidogrel (PLAVIX) 75 MG tablet Take 75 mg by mouth daily.    [provider]  folic acid (FOLVITE) 1 MG tablet Take 1 tablet (1 mg total) by mouth daily. 09/10/19   Roxan Hockey, MD  methocarbamol (ROBAXIN) 750 MG tablet Take 1 tablet (750 mg total) by mouth 3 (three) times daily. For muscle pain 09/09/19   Roxan Hockey, MD  thiamine 100 MG tablet Take 1 tablet (100 mg total) by mouth daily. 09/10/19   Roxan Hockey, MD  umeclidinium bromide (INCRUSE ELLIPTA) 62.5 MCG/INH AEPB Inhale 1 puff into the lungs daily. For breathing 09/10/19   Roxan Hockey, MD    Allergies    Patient has no known allergies.  Review of Systems   Review of Systems  Constitutional: Negative for chills and fever.  Eyes:       Patient is legally blind at baseline.  Respiratory: Negative for cough and shortness of breath.   Cardiovascular: Negative for chest pain.  Gastrointestinal: Negative for abdominal pain and vomiting.  Musculoskeletal: Positive for gait problem.  Neurological: Positive for weakness and numbness. Negative for syncope.  All other systems reviewed and are negative.   Physical Exam Updated Vital Signs BP (!) 162/86 (BP Location: Right Arm)   Pulse 86   Temp (!) 97.3 F (36.3 C) (Oral)   Resp 16   Ht 5\' 6"  (1.676 m)   Wt 77.1 kg   SpO2 99%   BMI 27.44 kg/m   Physical Exam Vitals and nursing note reviewed.  Constitutional:      General: He is not in acute distress.    Appearance: He is not toxic-appearing.  HENT:     Head: Normocephalic and atraumatic.     Nose: Nose normal.  Eyes:     Pupils: Pupils are  equal, round, and reactive to light.     Comments: Patient is legally blind.   Cardiovascular:     Rate and Rhythm: Normal rate and regular rhythm.  Pulmonary:     Effort: Pulmonary effort is normal.     Breath sounds: Normal breath sounds.  Abdominal:     General: There is no distension.     Palpations: Abdomen is soft.     Tenderness: There is no abdominal tenderness. There  is no guarding or rebound.  Musculoskeletal:     Cervical back: Neck supple. No rigidity.  Skin:    General: Skin is warm and dry.  Neurological:     Mental Status: He is alert.     Comments: Patient is alert.  No obvious aphasia/dysarthria.  Patient's right upper extremity grip strength is weaker compared to left upper extremity grip strength.  He has symmetric 5 out of 5 strength with plantar dorsiflexion bilaterally.  Unable to fully evaluate finger-to-nose testing as patient is blind.  When asked to hold bilateral upper extremities up for pronator drift test he does have some drift in the right upper extremity, however when asked to hold each of the upper extremities up individually he is able to maintain  without drift bilaterally.     ED Results / Procedures / Treatments   Labs (all labs ordered are listed, but only abnormal results are displayed) Labs Reviewed  CBC - Abnormal; Notable for the following components:      Result Value   Hemoglobin 11.0 (*)    MCV 73.3 (*)    MCH 20.4 (*)    MCHC 27.8 (*)    RDW 19.0 (*)    All other components within normal limits  I-STAT CHEM 8, ED - Abnormal; Notable for the following components:   Creatinine, Ser 0.50 (*)    Glucose, Bld 131 (*)    Calcium, Ion 1.09 (*)    Hemoglobin 12.6 (*)    HCT 37.0 (*)    All other components within normal limits  CBG MONITORING, ED - Abnormal; Notable for the following components:   Glucose-Capillary 126 (*)    All other components within normal limits  PROTIME-INR  APTT  DIFFERENTIAL  ETHANOL  COMPREHENSIVE METABOLIC  PANEL  RAPID URINE DRUG SCREEN, HOSP PERFORMED  URINALYSIS, ROUTINE W REFLEX MICROSCOPIC    EKG EKG Interpretation  Date/Time:  Monday August 02 2020 13:55:22 EDT Ventricular Rate:  79 PR Interval:    QRS Duration: 91 QT Interval:  433 QTC Calculation: 497 R Axis:   71 Text Interpretation: Sinus rhythm Nonspecific repol abnormality, lateral leads Artifact Confirmed by Veryl Speak [phone]) on 08/02/2020 2:03:41 PM   Radiology CT CEREBRAL PERFUSION W CONTRAST  Result Date: 08/02/2020 CLINICAL DATA:  Code stroke EXAM: CT PERFUSION BRAIN TECHNIQUE: Multiphase CT imaging of the brain was performed following IV bolus contrast injection. Subsequent parametric perfusion maps were calculated using RAPID software. CONTRAST:  126mL OMNIPAQUE IOHEXOL 350 MG/ML SOLN COMPARISON:  Same day noncontrast head CT.  09/07/2019 head CT. FINDINGS: CT Brain Perfusion Findings: CBF (<30%) Volume: 61mL Perfusion (Tmax>6.0s) volume: 26mL Mismatch Volume: 70mL ASPECTS on noncontrast CT Head: 10 at 1:50 p.m. Today. Infarct Core: 33 mL Infarction Location:Left parietooccipital region. IMPRESSION: 33 mL core infarct involving left parietooccipital region with surrounding ischemic penumbra of 45 mL. Electronically Signed   By: Primitivo Gauze M.D.   On: 08/02/2020 13:58   CT HEAD CODE STROKE WO CONTRAST  Result Date: 08/02/2020 CLINICAL DATA:  Code stroke.  Neuro deficit EXAM: CT HEAD WITHOUT CONTRAST TECHNIQUE: Contiguous axial images were obtained from the base of the skull through the vertex without intravenous contrast. COMPARISON:  09/07/2019 head CT. FINDINGS: Brain: Ill-defined hypodense region involving the left parietooccipital region with blurring of the gray-white interface is concerning for acute infarct (5:55). Sequela of remote right MCA territory and left basal ganglia insults. Background scattered and confluent supratentorial white matter hypodensities are nonspecific however commonly associated with  chronic microvascular ischemic changes. No mass lesion. No midline shift, ventriculomegaly or extra-axial fluid collection. Vascular: No hyperdense vessel. Bilateral skull base atherosclerotic calcifications. Skull: Negative for fracture or focal lesion. Sinuses/Orbits: Normal orbits. Clear paranasal sinuses. No mastoid effusion. Other: None. ASPECTS [facility] Stroke Program Early CT Score) - Ganglionic level infarction (caudate, lentiform nuclei, internal capsule, insula, M1-M3 cortex): 7 - Supraganglionic infarction (M4-M6 cortex): 3 Total score (0-10 with 10 being normal): 10 IMPRESSION: 1. Acute left parietooccipital infarct.  ASPECTS is 10. 2. Sequela of remote right MCA territory and left basal ganglia insults. 3. Chronic microvascular ischemic changes. Code stroke imaging results were communicated on 08/02/2020 at 1:51 pm to provider Dr. Curly [place] Via AMION secure text paging. Electronically Signed   By: Primitivo Gauze M.D.   On: 08/02/2020 13:53    Procedures Procedures (including critical care time)  Medications Ordered in ED Medications  iohexol (OMNIPAQUE) 350 MG/ML injection 100 mL (100 mLs Intravenous Contrast Given 08/02/20 1344)    ED Course  I have reviewed the triage vital signs and the nursing notes.  Pertinent labs & imaging results that were available during my care of the patient were reviewed by me and considered in my medical decision making (see chart for details).  Nathan Hart was evaluated in Emergency Department on 08/02/2020 for the symptoms described in the history of present illness. He/she was evaluated in the context of the global COVID-19 pandemic, which necessitated consideration that the patient might be at risk for infection with the SARS-CoV-2 virus that causes COVID-19. Institutional protocols and algorithms that pertain to the evaluation of patients at risk for COVID-19 are in a state of rapid change based on information released by regulatory bodies including  the CDC and federal and state organizations. These policies and algorithms were followed during the patient's care in the ED.    MDM Rules/Calculators/A&P                         Patient presents to the ED activated as a code stroke from triage due to right sided weakness and gait difficulty which was first noted at 1030 this morning, last known normal was 10 AM per patient's son.  Of note patient was having some mild right upper extremity numbness last night as well making true last known normal time a bit complicated.  On my assessment of the patient he has right upper extremity weakness with grip strength compared to the left upper extremity.   Additional history obtained:  Additional history obtained from patient son at bedside.. Previous records obtained and reviewed.   Lab Tests:  I reviewed interpreted labs, which included:  CBG:  126 CBC: Mild anemia slightly worsened from prior on record.  No leukocytosis. I-STAT Chem-8: Fairly unremarkable. PT/INR/APTT: Within normal limits CMP: Pending Imaging Studies ordered:  CT imaging of the head/neck ordered per neurology team- I independently visualized and interpreted imaging, results called by radiologist to neurology team: 1. Acute left parietooccipital infarct.  ASPECTS is 10. 2. Sequela of remote right MCA territory and left basal ganglia insults. 3. Chronic microvascular ischemic changes.  I discussed with neurologist Dr. Erlinda Hong at patient's bedside, plan for hospital service admission for stroke work-up, he relays patient is not a TPA candidate and is not going to IR.   15:12: CONSULT: Discussed with hospitalist Dr. Loleta Books- accepts admission.   Portions of this note were generated with Lobby[employer]. Dictation errors may occur despite best attempts at  proofreading.  Final Clinical Impression(s) / ED Diagnoses Final diagnoses:  Acute CVA (cerebrovascular accident) [facility])    Rx / [place] Orders ED Discharge Orders    None         Amaryllis Dyke, PA-C 08/02/20 1515    Veryl Speak, MD 08/03/20 [phone]

## 2020-08-02 NOTE — Code Documentation (Signed)
Stroke Response Nurse Documentation Code Documentation  BENSON PORCARO is a 64 y.o. male arriving to [facility] ED via POV on 08/02/2020 with past medical hx of CVA, legally blind, stent ICA, . Code stroke was activated by ED. Patient from home where he was LKW yesterday at 6pm, he complained of right arm numbness.  Shortly before noon this morning his son heard him fall.  He right arm and leg were initially flaccid.. On No antithrombotic. Labs drawn and patient cleared for CT by Dr. Erlinda Hong. Patient to CT with team. NIHSS 7, see documentation for details and code stroke times. Patient with disoriented, left facial droop and right arm weakness on exam. The following imaging was completed: CT, CTA head and neck, CTP. Patient is not a candidate for tPA due to outside window Care/Plan. Bedside handoff with ED RN kelly.    Raliegh Ip  Stroke Response RN

## 2020-08-02 NOTE — Consult Note (Addendum)
Stroke Neurology Consultation Note  Consult Requested by: Dr. Stark Jock  Reason for Consult: code stroke  Consult Date: 08/02/20   The history was obtained from the son.  During history and examination, all items were able to obtain unless otherwise noted.  History of Present Illness:  Nathan Hart is a 64 y.o. Caucasian male with PMH of hypertension, PAD, CVA, COPD, legally blind bilaterally, smoker and alcohol user presented to ER for code stroke.  As per son, yesterday evening around 6 PM patient complained of right arm numbness.  This morning son saw him 8 AM in the breakfast table at his baseline.  After that son and patient went back to sleep.  Son woke up around 10:30 AM to 11 a.m., did not see patient but heard thomp sound upstairs where patient slept.  Son found patient to have right arm flaccid and right leg weakness, not able to stand up or walk.  EMS was called.  Patient sent to ER for evaluation.  Met patient in the CT scanner, at that time patient only has mild right arm drift and right hand weakness but right leg strong and symmetrical to the left.    Patient had symptomatic right carotid stenosis about 90% status post right carotid stenting in 08/2019.  Seen by Dr. Estanislado Pandy at the time, cerebral angiogram also showed left ICA 50% and left CCA 50% stenosis.  He was on aspirin and Plavix at home.  Son assumed patient takes medication but not sure.  As per son, patient legally blind bilaterally due to severe macular degeneration.  Able to see some on the peripheral on the left but still very bad.  Patient still smoking and alcohol use.  LSN: 8am possible tPA Given: No: outside window, established left parieto-occipital stroke  Past Medical History:  Diagnosis Date  . Abnormal peripheral vision of left eye   . Blind right eye   . COPD (chronic obstructive pulmonary disease) (HCC)     home o2 hs and prn  . Dyspnea    with activity  . Empyema lung ([place])   . ETOH abuse   .  HTN (hypertension)   . Peripheral vascular disease ([place])   . Pneumonia 2020  . Stroke [facility])     Past Surgical History:  Procedure Laterality Date  . BACK SURGERY     disc lumar x 2   . CLAVICLE SURGERY Right   . IR ANGIO INTRA EXTRACRAN SEL COM CAROTID INNOMINATE BILAT MOD SED  09/12/2019  . IR ANGIO VERTEBRAL SEL SUBCLAVIAN INNOMINATE UNI L MOD SED  09/12/2019  . IR ANGIO VERTEBRAL SEL VERTEBRAL UNI R MOD SED  09/12/2019  . IR INTRAVSC STENT CERV CAROTID W/EMB-PROT MOD SED INCL ANGIO  09/24/2019  . IR US GUIDE VASC ACCESS RIGHT  09/12/2019  . KNEE SURGERY Right     x 3 ACL  . RADIOLOGY WITH ANESTHESIA N/A 09/24/2019   Procedure: IR WITH ANESTHESIA/STENTING;  Surgeon: Luanne Bras, MD;  Location: [facility];  Service: Radiology;  Laterality: N/A;  . [place], [place]    Family History  Problem Relation Age of Onset  . Cancer Mother     Social History:  reports that he has been smoking cigarettes. He has a 13.50 pack-year smoking history. He has never used smokeless tobacco. He reports current alcohol use of about 20.0 standard drinks of alcohol per week. He reports that he does not use drugs.  Allergies: No Known Allergies  No current facility-administered medications on file prior to encounter.   Current Outpatient Medications on File Prior to Encounter  Medication Sig Dispense Refill  . acetaminophen (TYLENOL) 325 MG tablet Take 650 mg by mouth every 6 (six) hours as needed for moderate pain or headache.    . albuterol (PROVENTIL) (2.5 MG/3ML) 0.083% nebulizer solution Take 3 mLs (2.5 mg total) by nebulization every 6 (six) hours as needed for wheezing or shortness of breath. For breathing 75 mL 12  . atenolol (TENORMIN) 50 MG tablet Take 1 tablet (50 mg total) by mouth daily. For BP 30 tablet 2  . atorvastatin (LIPITOR) 40 MG tablet Take 1 tablet (40 mg total) by mouth every evening. For Cholesterol 30 tablet 5  . diphenhydrAMINE HCl, Sleep, (ZZZQUIL) 25 MG  CAPS Take 25 mg by mouth at bedtime as needed (sleep).    . folic acid (FOLVITE) 1 MG tablet Take 1 tablet (1 mg total) by mouth daily. 30 tablet 5  . amLODipine (NORVASC) 10 MG tablet Take 1 tablet (10 mg total) by mouth daily. For BP (Patient not taking: Reported on 08/02/2020) 30 tablet 2  . aspirin EC 81 MG tablet Take 1 tablet (81 mg total) by mouth daily. (Patient not taking: Reported on 08/02/2020) 30 tablet 3  . methocarbamol (ROBAXIN) 750 MG tablet Take 1 tablet (750 mg total) by mouth 3 (three) times daily. For muscle pain (Patient not taking: Reported on 08/02/2020) 90 tablet 0  . thiamine 100 MG tablet Take 1 tablet (100 mg total) by mouth daily. (Patient not taking: Reported on 08/02/2020) 30 tablet 5  . umeclidinium bromide (INCRUSE ELLIPTA) 62.5 MCG/INH AEPB Inhale 1 puff into the lungs daily. For breathing (Patient not taking: Reported on 08/02/2020) 30 each 5    Review of Systems: A full ROS was attempted today and was able to be performed.  Systems assessed include - Constitutional, Eyes, HENT, Respiratory, Cardiovascular, Gastrointestinal, Genitourinary, Integument/breast, Hematologic/lymphatic, Musculoskeletal, Neurological, Behavioral/Psych, Endocrine, Allergic/Immunologic - with pertinent responses as per HPI.  Physical Examination: Temp:  [97.3 F (36.3 C)] 97.3 F (36.3 C) (08/09 1313) Pulse Rate:  [86] 86 (08/09 1313) Resp:  [16] 16 (08/09 1313) BP: (162)/(86) 162/86 (08/09 1313) SpO2:  [99 %] 99 % (08/09 1313) Weight:  [77.1 kg] 77.1 kg (08/09 1313)  General - well nourished, well developed, in no apparent distress.    Ophthalmologic - fundi not visualized due to noncooperation.    Cardiovascular - regular rhythm and rate  Mental Status -  Level of arousal and orientation to self and son were intact.  However, not orientated to age or time. Language including expression, naming, repetition, comprehension, reading, and writing was assessed and found intact.  Cranial  Nerves II - XII - II - Visual field difficult test due to legally blind bilaterally. III, IV, VI - Extraocular movements intact, no gaze deviation. V - Facial sensation intact bilaterally. VII - left nasolabial fold flattening, likely chronic. VIII - Hearing & vestibular intact bilaterally. X - Palate elevates symmetrically. XI - Chin turning & shoulder shrug intact bilaterally. XII - Tongue protrusion intact.  Motor Strength - The patient's strength was normal left upper extremities, however right upper extremity drift with decreased finger grip.  Bilateral lower extremity equal strength 5/5.  Motor Tone & Bulk - Muscle tone was assessed at the neck and appendages and was normal.  Bulk was normal and fasciculations were absent.   Reflexes - The patient's reflexes were normal in all extremities and  he had no pathological reflexes.  Sensory - Light touch, temperature/pinprick were assessed and were normal.    Coordination - The patient had normal movements in the hands with no ataxia or dysmetria, however slow on the right.  Tremor was absent.  Gait and Station - deferred  NIH Stroke Scale  Level Of Consciousness 0=Alert; keenly responsive 1=Arouse to minor stimulation 2=Requires repeated stimulation to arouse or movements to pain 3=postures or unresponsive 0  LOC Questions to Month and Age 39=Answers both questions correctly 1=Answers one question correctly or dysarthria/intubated/trauma/language barrier 2=Answers neither question correctly or aphasia 2  LOC Commands      -Open/Close eyes     -Open/close grip     -Pantomime commands if communication barrier 0=Performs both tasks correctly 1=Performs one task correctly 2=Performs neighter task correctly 0  Best Gaze     -Only assess horizontal gaze 0=Normal 1=Partial gaze palsy 2=Forced deviation, or total gaze paresis 0  Visual 0=No visual loss 1=Partial hemianopia 2=Complete hemianopia 3=Bilateral hemianopia (blind  including cortical blindness) 3  Facial Palsy     -Use grimace if obtunded 0=Normal symmetrical movement 1=Minor paralysis (asymmetry) 2=Partial paralysis (lower face) 3=Complete paralysis (upper and lower face) 1  Motor  0=No drift for 10/5 seconds 1=Drift, but does not hit bed 2=Some antigravity effort, hits  bed 3=No effort against gravity, limb falls 4=No movement 0=Amputation/joint fusion Right Arm 1     Leg 0    Left Arm 0     Leg 0  Limb Ataxia     - FNT/HTS 0=Absent or does not understand or paralyzed or amputation/joint fusion 1=Present in one limb 2=Present in two limbs 0  Sensory 0=Normal 1=Mild to moderate sensory loss 2=Severe to total sensory loss or coma/unresponsive 0  Best Language 0=No aphasia, normal 1=Mild to moderate aphasia 2=Severe aphasia 3=Mute, global aphasia, or coma/unresponsive 0  Dysarthria 0=Normal 1=Mild to moderate 2=Severe, unintelligible or mute/anarthric 0=intubated/unable to test 0  Extinction/Neglect 0=No abnormality 1=visual/tactile/auditory/spatia/personal inattention/Extinction to bilateral simultaneous stimulation 2=Profound neglect/extinction more than 1 modality  0  Total   7     Data Reviewed: CT Code Stroke CTA Head W/WO contrast  Result Date: 08/02/2020 CLINICAL DATA:  Neuro deficit EXAM: CT ANGIOGRAPHY HEAD AND NECK TECHNIQUE: Multidetector CT imaging of the head and neck was performed using the standard protocol during bolus administration of intravenous contrast. Multiplanar CT image reconstructions and MIPs were obtained to evaluate the vascular anatomy. Carotid stenosis measurements (when applicable) are obtained utilizing NASCET criteria, using the distal internal carotid diameter as the denominator. CONTRAST:  132m OMNIPAQUE IOHEXOL 350 MG/ML SOLN COMPARISON:  Same day noncontrast head CT and CT cerebral perfusion. 09/08/2019 CT neck. FINDINGS: CTA NECK FINDINGS Aortic arch: Standard branching. Imaged portion shows no  evidence of aneurysm or dissection. No significant stenosis of the major arch vessel origins. Calcified atheromatous plaque. Right carotid system: No evidence of dissection, stenosis (50% or greater) or occlusion. Calcified atheromatous plaque at the bifurcation. Patent right stent spanning the distal common/proximal internal carotid arteries. Left carotid system: The proximal common left carotid artery demonstrates complete occlusion shortly after its takeoff to the level of the bifurcation where there is extensive calcified and noncalcified atheromatous plaque. There is a distal reconstitution of the left internal carotid and external carotid arteries. The left internal carotid artery demonstrates comparatively diminished opacification. Vertebral arteries: Dominant right vertebral artery. Diminutive left vertebral artery with V4 segment atherosclerotic calcifications. No definite focal high-grade narrowing however diffuse vessel wall irregularity likely reflects  atheromatous disease. No evidence of dissection, stenosis (50% or greater) or occlusion. Skeleton: Multilevel spondylosis with reversal of cervical lordosis. Grade 1 C2-3 and C7-T1 anterolisthesis. Grade 1 C5-6 and C6-7 retrolisthesis. No acute osseous abnormality. Other neck: No adenopathy.  No soft tissue mass. Upper chest: Emphysema.  Biapical atelectasis. Review of the MIP images confirms the above findings CTA HEAD FINDINGS Anterior circulation: Asymmetrically diminished opacification of the left internal carotid artery. Bilateral carotid siphon atherosclerotic calcifications, right greater than left. Distal left ICA cervical segment and petrous segment atherosclerotic calcifications. Diminutive left M1 segment. No aneurysm or vascular malformation. Posterior circulation: Dominant right vertebral artery. Dominant right PCA. Fetal origin of the left PCA. Mild-to-moderate irregularity involving the left P2 segment likely secondary to atheromatous  disease. No aneurysm, or vascular malformation. Asymmetrically diminished opacification of the left parietooccipital cortical branches. Venous sinuses: No evidence of thrombosis. Anatomic variants: Bilateral PCOM hypoplasia. Review of the MIP images confirms the above findings IMPRESSION: 1. 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 Distal reconstitution of the left internal and external carotid arteries. 2. Asymmetrically diminished opacification of the left internal carotid artery to the terminus. 3. Mild to moderate narrowing and irregularity involving the left P2 segment, likely secondary to atheromatous disease. 4. Asymmetrically diminished opacification of left parietooccipital cortical branches. 5. Patent right common/proximal internal carotid artery stent. 6. Diminutive left vertebral artery with V4 segment atherosclerotic calcifications. These results were called by telephone at the time of interpretation on 08/02/2020 at 2:46 pm to provider Dr. Erlinda Hong, Who verbally acknowledged these results. Electronically Signed   By: Primitivo Gauze M.D.   On: 08/02/2020 14:58   CT Code Stroke CTA Neck W/WO contrast  Result Date: 08/02/2020 CLINICAL DATA:  Neuro deficit EXAM: CT ANGIOGRAPHY HEAD AND NECK TECHNIQUE: Multidetector CT imaging of the head and neck was performed using the standard protocol during bolus administration of intravenous contrast. Multiplanar CT image reconstructions and MIPs were obtained to evaluate the vascular anatomy. Carotid stenosis measurements (when applicable) are obtained utilizing NASCET criteria, using the distal internal carotid diameter as the denominator. CONTRAST:  136m OMNIPAQUE IOHEXOL 350 MG/ML SOLN COMPARISON:  Same day noncontrast head CT and CT cerebral perfusion. 09/08/2019 CT neck. FINDINGS: CTA NECK FINDINGS Aortic arch: Standard branching. Imaged portion shows no evidence of aneurysm or dissection.  No significant stenosis of the major arch vessel origins. Calcified atheromatous plaque. Right carotid system: No evidence of dissection, stenosis (50% or greater) or occlusion. Calcified atheromatous plaque at the bifurcation. Patent right stent spanning the distal common/proximal internal carotid arteries. Left carotid system: The proximal common left carotid artery demonstrates complete occlusion shortly after its takeoff to the level of the bifurcation where there is extensive calcified and noncalcified atheromatous plaque. There is a distal reconstitution of the left internal carotid and external carotid arteries. The left internal carotid artery demonstrates comparatively diminished opacification. Vertebral arteries: Dominant right vertebral artery. Diminutive left vertebral artery with V4 segment atherosclerotic calcifications. No definite focal high-grade narrowing however diffuse vessel wall irregularity likely reflects atheromatous disease. No evidence of dissection, stenosis (50% or greater) or occlusion. Skeleton: Multilevel spondylosis with reversal of cervical lordosis. Grade 1 C2-3 and C7-T1 anterolisthesis. Grade 1 C5-6 and C6-7 retrolisthesis. No acute osseous abnormality. Other neck: No adenopathy.  No soft tissue mass. Upper chest: Emphysema.  Biapical atelectasis. Review of the MIP images confirms the above findings CTA HEAD FINDINGS Anterior circulation: Asymmetrically diminished opacification of the left  internal carotid artery. Bilateral carotid siphon atherosclerotic calcifications, right greater than left. Distal left ICA cervical segment and petrous segment atherosclerotic calcifications. Diminutive left M1 segment. No aneurysm or vascular malformation. Posterior circulation: Dominant right vertebral artery. Dominant right PCA. Fetal origin of the left PCA. Mild-to-moderate irregularity involving the left P2 segment likely secondary to atheromatous disease. No aneurysm, or vascular  malformation. Asymmetrically diminished opacification of the left parietooccipital cortical branches. Venous sinuses: No evidence of thrombosis. Anatomic variants: Bilateral PCOM hypoplasia. Review of the MIP images confirms the above findings IMPRESSION: 1. 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 Distal reconstitution of the left internal and external carotid arteries. 2. Asymmetrically diminished opacification of the left internal carotid artery to the terminus. 3. Mild to moderate narrowing and irregularity involving the left P2 segment, likely secondary to atheromatous disease. 4. Asymmetrically diminished opacification of left parietooccipital cortical branches. 5. Patent right common/proximal internal carotid artery stent. 6. Diminutive left vertebral artery with V4 segment atherosclerotic calcifications. These results were called by telephone at the time of interpretation on 08/02/2020 at 2:46 pm to provider Dr. Erlinda Hong, Who verbally acknowledged these results. Electronically Signed   By: Primitivo Gauze M.D.   On: 08/02/2020 14:58   CT CEREBRAL PERFUSION W CONTRAST  Result Date: 08/02/2020 CLINICAL DATA:  Code stroke EXAM: CT PERFUSION BRAIN TECHNIQUE: Multiphase CT imaging of the brain was performed following IV bolus contrast injection. Subsequent parametric perfusion maps were calculated using RAPID software. CONTRAST:  143m OMNIPAQUE IOHEXOL 350 MG/ML SOLN COMPARISON:  Same day noncontrast head CT.  09/07/2019 head CT. FINDINGS: CT Brain Perfusion Findings: CBF (<30%) Volume: 370mPerfusion (Tmax>6.0s) volume: 4526mismatch Volume: 42m40mPECTS on noncontrast CT Head: 10 at 1:50 p.m. Today. Infarct Core: 33 mL Infarction Location:Left parietooccipital region. IMPRESSION: 33 mL core infarct involving left parietooccipital region with surrounding ischemic penumbra of 45 mL. Electronically Signed   By: ChikPrimitivo Gauze.   On:  08/02/2020 13:58   CT HEAD CODE STROKE WO CONTRAST  Result Date: 08/02/2020 CLINICAL DATA:  Code stroke.  Neuro deficit EXAM: CT HEAD WITHOUT CONTRAST TECHNIQUE: Contiguous axial images were obtained from the base of the skull through the vertex without intravenous contrast. COMPARISON:  09/07/2019 head CT. FINDINGS: Brain: Ill-defined hypodense region involving the left parietooccipital region with blurring of the gray-white interface is concerning for acute infarct (5:55). Sequela of remote right MCA territory and left basal ganglia insults. Background scattered and confluent supratentorial white matter hypodensities are nonspecific however commonly associated with chronic microvascular ischemic changes. No mass lesion. No midline shift, ventriculomegaly or extra-axial fluid collection. Vascular: No hyperdense vessel. Bilateral skull base atherosclerotic calcifications. Skull: Negative for fracture or focal lesion. Sinuses/Orbits: Normal orbits. Clear paranasal sinuses. No mastoid effusion. Other: None. ASPECTS ([facility] Early CT Score) - Ganglionic level infarction (caudate, lentiform nuclei, internal capsule, insula, M1-M3 cortex): 7 - Supraganglionic infarction (M4-M6 cortex): 3 Total score (0-10 with 10 being normal): 10 IMPRESSION: 1. Acute left parietooccipital infarct.  ASPECTS is 10. 2. Sequela of remote right MCA territory and left basal ganglia insults. 3. Chronic microvascular ischemic changes. Code stroke imaging results were communicated on 08/02/2020 at 1:51 pm to provider Dr. [place] AMION secure text paging. Electronically Signed   By: ChikPrimitivo Gauze.   On: 08/02/2020 13:53    Assessment: 64 y38. male with PMH of hypertension, PAD, CVA, COPD, legally blind bilaterally, smoker and alcohol user presented  to ER for right-sided weakness which has improved in ER.  Currently only had right arm drift and hand weakness.  NIH score 7 largely due to disorientation and symptom  from legally blind and old stroke in the past.  Last seen normal 8 AM. CT showed left parieto-occipital acute infarct, and old right CR infarct.  CTA head and neck showed left CCA possible occlusion, and left ICA reconstitution with slow flow.  Right CCA stent patent, bilateral siphon high-grade stenosis.  Left P2 high-grade stenosis.  CTP 33 cc core with 45 penumbra.  Patient not a TPA candidate given outside window and establish stroke.  Patient not IR candidate given mild symptoms and insignificant mismatch.  Given patient severe vasculopathy, discussed with Dr. Estanislado Pandy, will attempt cerebral angiogram in the morning for further evaluation.  Stat aspirin Plavix and high-dose statin.  Permissive hypertension.  And ideally no lower than 160.  Stroke Risk Factors - carotid stenosis, hypertension and smoking  Plan: - stat aspirin 325, Plavix 75 and high-dose statin - permissive hypertension, no need to treat if BP less than 220/120.  Ideally more than 160.  Currently patient BP around 200. - Plan for cerebral angiogram in the morning. - HgbA1c, fasting lipid panel - MRI of the brain without contrast - PT consult, OT consult, Speech consult - Echocardiogram - Bedrest for today - Risk factor modification - Telemetry monitoring - Frequent neuro checks - IVF  Thank you for this consultation and allowing [place] to participate in the care of this patient.  Case discussed with EDP Dr. Stark Jock.  Rosalin Hawking, MD PhD Stroke Neurology 08/02/2020 3:37 PM  This patient is in critical condition due to left carotid occlusion, b/l ICA stenosis intracranially, hx of stroke, hypertensive emergency and at significant risk of neurological worsening, death form recurrent large stroke, hemorrhagic conversion, seizure, hypertensive encephalopathy. This patient's care requires constant monitoring of vital signs, hemodynamics, respiratory and cardiac monitoring, review of multiple databases, neurological assessment,  discussion with family, other specialists and medical decision making of high complexity. I had long discussion with pt and son at bedside, updated pt current condition, treatment plan and potential prognosis, and answered all the questions. They expressed understanding and appreciation.

## 2020-08-02 NOTE — H&P (Signed)
History and Physical  Patient Name: Nathan Hart     EVO:[id number]    DOB: Dec 03, 1956    DOA: 08/02/2020 PCP: Patient, No Pcp Per   Patient coming from: Home     Chief Complaint: Hemiparesis  HPI: Nathan Hart is a 64 y.o. male with a past medical history significant for alcohol use disorder, smoking, COPD not on O2, legally blind and PVD s/p RIGHT carotid stent who presents with right hemiparesis.  All history collected from the son by telephone for sufficient is unable to provide independent history.  Evidently last night prior to bed the patient had an episode of right numbness in the right arm.  He went to bed and this morning he appeared normal at 9 AM, but around noon he fell down to try to put his clothes on.  EMS were called and found that he had right hemiparesis.  Presented to the ER as a code stroke.  In the ER CT of the head showed a left parietal infarct with left MCA territory changes.  ECG unremarkable.  He was given aspirin, Plavix, statin, neurology felt he was not a TPA candidate, and he was admitted to the hospital service for stroke.      Review of systems:  Review of Systems  Unable to perform ROS: Mental status change         Past Medical History:  Diagnosis Date  . Abnormal peripheral vision of left eye   . Blind right eye   . COPD (chronic obstructive pulmonary disease) (HCC)     home o2 hs and prn  . Dyspnea    with activity  . Empyema lung ([place])   . ETOH abuse   . HTN (hypertension)   . Peripheral vascular disease ([place])   . Pneumonia 2020  . Stroke [facility])     Past Surgical History:  Procedure Laterality Date  . BACK SURGERY     disc lumar x 2   . CLAVICLE SURGERY Right   . IR ANGIO INTRA EXTRACRAN SEL COM CAROTID INNOMINATE BILAT MOD SED  09/12/2019  . IR ANGIO VERTEBRAL SEL SUBCLAVIAN INNOMINATE UNI L MOD SED  09/12/2019  . IR ANGIO VERTEBRAL SEL VERTEBRAL UNI R MOD SED  09/12/2019  . IR INTRAVSC STENT CERV CAROTID W/EMB-PROT  MOD SED INCL ANGIO  09/24/2019  . IR US GUIDE VASC ACCESS RIGHT  09/12/2019  . KNEE SURGERY Right     x 3 ACL  . RADIOLOGY WITH ANESTHESIA N/A 09/24/2019   Procedure: IR WITH ANESTHESIA/STENTING;  Surgeon: Luanne Bras, MD;  Location: [facility];  Service: Radiology;  Laterality: N/A;  . [place], [place]    Social History: Patient lives with his son.  Patient walks unassisted.  Active smoker.  Active drinker, no hx DTs per son but does get shakes.   reports that he has been smoking cigarettes. He has a 13.50 pack-year smoking history. He has never used smokeless tobacco. He reports current alcohol use of about 20.0 standard drinks of alcohol per week. He reports that he does not use drugs.  No Known Allergies  Family history: family history includes Cancer in his mother.    Prior to Admission medications   Medication Sig Start Date End Date Taking? Authorizing Provider  acetaminophen (TYLENOL) 325 MG tablet Take 650 mg by mouth every 6 (six) hours as needed for moderate pain or headache.   Yes [provider]  albuterol (PROVENTIL) (2.5 MG/3ML) 0.083%  nebulizer solution Take 3 mLs (2.5 mg total) by nebulization every 6 (six) hours as needed for wheezing or shortness of breath. For breathing 09/09/19  Yes Emokpae, Courage, MD  atenolol (TENORMIN) 50 MG tablet Take 1 tablet (50 mg total) by mouth daily. For BP 09/10/19  Yes Emokpae, Courage, MD  atorvastatin (LIPITOR) 40 MG tablet Take 1 tablet (40 mg total) by mouth every evening. For Cholesterol 09/09/19  Yes Emokpae, Courage, MD  diphenhydrAMINE HCl, Sleep, (ZZZQUIL) 25 MG CAPS Take 25 mg by mouth at bedtime as needed (sleep).   Yes [provider]  folic acid (FOLVITE) 1 MG tablet Take 1 tablet (1 mg total) by mouth daily. 09/10/19  Yes Roxan Hockey, MD     Physical Exam: BP (!) 162/86 (BP Location: Right Arm)   Pulse 86   Temp (!) 97.3 F (36.3 C) (Oral)   Resp 16   Ht 5\' 6"  (1.676 m)   Wt 77.1  kg   SpO2 99%   BMI 27.44 kg/m  General appearance: Well-developed, elderly adult male, sleeping but rouses to voice, is disoriented.  Unable to make eye contact, because of blindness.  Eyes: Anicteric, amblyopia noted, conjunctiva pink, lids and lashes normal.  Pupils equal, But they do not react well.    ENT: No nasal deformity, discharge, epistaxis.  Hearing seems normal. OP moist without lesions.  Partial dentures.  Lips normal. Lymph: No cervical, supraclavicular or axillary lymphadenopathy. Skin: Warm and dry.  No jaundice.  No suspicious rashes or lesions. Cardiac: RRR, nl S1-S2, no murmurs appreciated.  Capillary refill is brisk.  JVP normal.  No LE edema.  Radial pulses 2+ and symmetric.   Respiratory: Normal respiratory rate and rhythm.  CTAB without rales or wheezes. GI: Abdomen soft without rigidity.  No TTP. No ascites, distension, no hepatosplenomegaly.   MSK: No deformities or effusions. Neuro: Amblyopia, cannot follow eye movements because of blindness.  He has diminished motor strength in the right arm, seems mostly normal in the left and in bilateral legs.  Speech is slow and halting, attention seems diminished, concentration diminished.    Psych: He states that it is "50" for the year, and that he is in [place].  He is inattentive, affect is blunted       Labs on Admission:  I have personally reviewed following labs and imaging studies: CBC: Recent Labs  Lab 08/02/20 1320 08/02/20 1325  WBC 8.1  --   NEUTROABS 6.3  --   HGB 11.0* 12.6*  HCT 39.5 37.0*  MCV 73.3*  --   PLT 291  --    Basic Metabolic Panel: Recent Labs  Lab 08/02/20 1320 08/02/20 1325  NA 138 140  K 4.0 4.0  CL 103 103  CO2 25  --   GLUCOSE 131* 131*  BUN 10 10  CREATININE 0.64 0.50*  CALCIUM 8.8*  --    GFR: Estimated Creatinine Clearance: 91.2 mL/min (A) (by C-G formula based on SCr of 0.5 mg/dL (L)). Liver Function Tests: Recent Labs  Lab 08/02/20 1320  AST 28  ALT 29    ALKPHOS 68  BILITOT 1.3*  PROT 7.3  ALBUMIN 3.4*    Coagulation Profile: Recent Labs  Lab 08/02/20 1320  INR 1.1   CBG: Recent Labs  Lab 08/02/20 1319  GLUCAP 126*     Radiological Exams on Admission: Personally reviewed CT head and CTA head reports which show occlusion of the left proximal carotid: CT Code Stroke CTA Head W/WO contrast  Result Date: 08/02/2020 CLINICAL DATA:  Neuro deficit EXAM: CT ANGIOGRAPHY HEAD AND NECK TECHNIQUE: Multidetector CT imaging of the head and neck was performed using the standard protocol during bolus administration of intravenous contrast. Multiplanar CT image reconstructions and MIPs were obtained to evaluate the vascular anatomy. Carotid stenosis measurements (when applicable) are obtained utilizing NASCET criteria, using the distal internal carotid diameter as the denominator. CONTRAST:  184mL OMNIPAQUE IOHEXOL 350 MG/ML SOLN COMPARISON:  Same day noncontrast head CT and CT cerebral perfusion. 09/08/2019 CT neck. FINDINGS: CTA NECK FINDINGS Aortic arch: Standard branching. Imaged portion shows no evidence of aneurysm or dissection. No significant stenosis of the major arch vessel origins. Calcified atheromatous plaque. Right carotid system: No evidence of dissection, stenosis (50% or greater) or occlusion. Calcified atheromatous plaque at the bifurcation. Patent right stent spanning the distal common/proximal internal carotid arteries. Left carotid system: The proximal common left carotid artery demonstrates complete occlusion shortly after its takeoff to the level of the bifurcation where there is extensive calcified and noncalcified atheromatous plaque. There is a distal reconstitution of the left internal carotid and external carotid arteries. The left internal carotid artery demonstrates comparatively diminished opacification. Vertebral arteries: Dominant right vertebral artery. Diminutive left vertebral artery with V4 segment atherosclerotic  calcifications. No definite focal high-grade narrowing however diffuse vessel wall irregularity likely reflects atheromatous disease. No evidence of dissection, stenosis (50% or greater) or occlusion. Skeleton: Multilevel spondylosis with reversal of cervical lordosis. Grade 1 C2-3 and C7-T1 anterolisthesis. Grade 1 C5-6 and C6-7 retrolisthesis. No acute osseous abnormality. Other neck: No adenopathy.  No soft tissue mass. Upper chest: Emphysema.  Biapical atelectasis. Review of the MIP images confirms the above findings CTA HEAD FINDINGS Anterior circulation: Asymmetrically diminished opacification of the left internal carotid artery. Bilateral carotid siphon atherosclerotic calcifications, right greater than left. Distal left ICA cervical segment and petrous segment atherosclerotic calcifications. Diminutive left M1 segment. No aneurysm or vascular malformation. Posterior circulation: Dominant right vertebral artery. Dominant right PCA. Fetal origin of the left PCA. Mild-to-moderate irregularity involving the left P2 segment likely secondary to atheromatous disease. No aneurysm, or vascular malformation. Asymmetrically diminished opacification of the left parietooccipital cortical branches. Venous sinuses: No evidence of thrombosis. Anatomic variants: Bilateral PCOM hypoplasia. Review of the MIP images confirms the above findings IMPRESSION: 1. 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 Distal reconstitution of the left internal and external carotid arteries. 2. Asymmetrically diminished opacification of the left internal carotid artery to the terminus. 3. Mild to moderate narrowing and irregularity involving the left P2 segment, likely secondary to atheromatous disease. 4. Asymmetrically diminished opacification of left parietooccipital cortical branches. 5. Patent right common/proximal internal carotid artery stent. 6. Diminutive left vertebral  artery with V4 segment atherosclerotic calcifications. These results were called by telephone at the time of interpretation on 08/02/2020 at 2:46 pm to provider Dr. Erlinda Hong, Who verbally acknowledged these results. Electronically Signed   By: Primitivo Gauze M.D.   On: 08/02/2020 14:58   CT Code Stroke CTA Neck W/WO contrast  Result Date: 08/02/2020 CLINICAL DATA:  Neuro deficit EXAM: CT ANGIOGRAPHY HEAD AND NECK TECHNIQUE: Multidetector CT imaging of the head and neck was performed using the standard protocol during bolus administration of intravenous contrast. Multiplanar CT image reconstructions and MIPs were obtained to evaluate the vascular anatomy. Carotid stenosis measurements (when applicable) are obtained utilizing NASCET criteria, using the distal internal carotid diameter as the denominator. CONTRAST:  158mL OMNIPAQUE  IOHEXOL 350 MG/ML SOLN COMPARISON:  Same day noncontrast head CT and CT cerebral perfusion. 09/08/2019 CT neck. FINDINGS: CTA NECK FINDINGS Aortic arch: Standard branching. Imaged portion shows no evidence of aneurysm or dissection. No significant stenosis of the major arch vessel origins. Calcified atheromatous plaque. Right carotid system: No evidence of dissection, stenosis (50% or greater) or occlusion. Calcified atheromatous plaque at the bifurcation. Patent right stent spanning the distal common/proximal internal carotid arteries. Left carotid system: The proximal common left carotid artery demonstrates complete occlusion shortly after its takeoff to the level of the bifurcation where there is extensive calcified and noncalcified atheromatous plaque. There is a distal reconstitution of the left internal carotid and external carotid arteries. The left internal carotid artery demonstrates comparatively diminished opacification. Vertebral arteries: Dominant right vertebral artery. Diminutive left vertebral artery with V4 segment atherosclerotic calcifications. No definite focal high-grade  narrowing however diffuse vessel wall irregularity likely reflects atheromatous disease. No evidence of dissection, stenosis (50% or greater) or occlusion. Skeleton: Multilevel spondylosis with reversal of cervical lordosis. Grade 1 C2-3 and C7-T1 anterolisthesis. Grade 1 C5-6 and C6-7 retrolisthesis. No acute osseous abnormality. Other neck: No adenopathy.  No soft tissue mass. Upper chest: Emphysema.  Biapical atelectasis. Review of the MIP images confirms the above findings CTA HEAD FINDINGS Anterior circulation: Asymmetrically diminished opacification of the left internal carotid artery. Bilateral carotid siphon atherosclerotic calcifications, right greater than left. Distal left ICA cervical segment and petrous segment atherosclerotic calcifications. Diminutive left M1 segment. No aneurysm or vascular malformation. Posterior circulation: Dominant right vertebral artery. Dominant right PCA. Fetal origin of the left PCA. Mild-to-moderate irregularity involving the left P2 segment likely secondary to atheromatous disease. No aneurysm, or vascular malformation. Asymmetrically diminished opacification of the left parietooccipital cortical branches. Venous sinuses: No evidence of thrombosis. Anatomic variants: Bilateral PCOM hypoplasia. Review of the MIP images confirms the above findings IMPRESSION: 1. 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 Distal reconstitution of the left internal and external carotid arteries. 2. Asymmetrically diminished opacification of the left internal carotid artery to the terminus. 3. Mild to moderate narrowing and irregularity involving the left P2 segment, likely secondary to atheromatous disease. 4. Asymmetrically diminished opacification of left parietooccipital cortical branches. 5. Patent right common/proximal internal carotid artery stent. 6. Diminutive left vertebral artery with V4 segment atherosclerotic  calcifications. These results were called by telephone at the time of interpretation on 08/02/2020 at 2:46 pm to provider Dr. Erlinda Hong, Who verbally acknowledged these results. Electronically Signed   By: Primitivo Gauze M.D.   On: 08/02/2020 14:58   CT CEREBRAL PERFUSION W CONTRAST  Result Date: 08/02/2020 CLINICAL DATA:  Code stroke EXAM: CT PERFUSION BRAIN TECHNIQUE: Multiphase CT imaging of the brain was performed following IV bolus contrast injection. Subsequent parametric perfusion maps were calculated using RAPID software. CONTRAST:  149mL OMNIPAQUE IOHEXOL 350 MG/ML SOLN COMPARISON:  Same day noncontrast head CT.  09/07/2019 head CT. FINDINGS: CT Brain Perfusion Findings: CBF (<30%) Volume: 10mL Perfusion (Tmax>6.0s) volume: 72mL Mismatch Volume: 26mL ASPECTS on noncontrast CT Head: 10 at 1:50 p.m. Today. Infarct Core: 33 mL Infarction Location:Left parietooccipital region. IMPRESSION: 33 mL core infarct involving left parietooccipital region with surrounding ischemic penumbra of 45 mL. Electronically Signed   By: Primitivo Gauze M.D.   On: 08/02/2020 13:58   CT HEAD CODE STROKE WO CONTRAST  Result Date: 08/02/2020 CLINICAL DATA:  Code stroke.  Neuro deficit EXAM: CT HEAD WITHOUT CONTRAST TECHNIQUE:  Contiguous axial images were obtained from the base of the skull through the vertex without intravenous contrast. COMPARISON:  09/07/2019 head CT. FINDINGS: Brain: Ill-defined hypodense region involving the left parietooccipital region with blurring of the gray-white interface is concerning for acute infarct (5:55). Sequela of remote right MCA territory and left basal ganglia insults. Background scattered and confluent supratentorial white matter hypodensities are nonspecific however commonly associated with chronic microvascular ischemic changes. No mass lesion. No midline shift, ventriculomegaly or extra-axial fluid collection. Vascular: No hyperdense vessel. Bilateral skull base atherosclerotic  calcifications. Skull: Negative for fracture or focal lesion. Sinuses/Orbits: Normal orbits. Clear paranasal sinuses. No mastoid effusion. Other: None. ASPECTS [facility] Stroke Program Early CT Score) - Ganglionic level infarction (caudate, lentiform nuclei, internal capsule, insula, M1-M3 cortex): 7 - Supraganglionic infarction (M4-M6 cortex): 3 Total score (0-10 with 10 being normal): 10 IMPRESSION: 1. Acute left parietooccipital infarct.  ASPECTS is 10. 2. Sequela of remote right MCA territory and left basal ganglia insults. 3. Chronic microvascular ischemic changes. Code stroke imaging results were communicated on 08/02/2020 at 1:51 pm to provider Dr. Curly Shores Via [employer] secure text paging. Electronically Signed   By: Primitivo Gauze M.D.   On: 08/02/2020 13:53     EKG: Independently reviewed.  Rate 79 normal QTC, LVH.    Assessment/Plan  1. Acute Stroke:  Patient presents with acute stroke, left carotid occlusion noted.  Outside window per Neurology, who plan a salvage endovascular approach tomorrow -Admit to telemetry -Neuro checks, NIHSS per protocol -Daily aspirin 325 mg plus Plavix plus high dose statin -Permissive hypertension for now up to 220/120 mmHg, labetalol PRN for higher pressures; flatten HOB and bed rest to maintain pressure >160 -Lipids, hemoglobin A1c ordered -Carotids imaged on CT angio -Echocardiogram ordered -PT/OT/SLP consultation -Consult to Neurology, appreciate recommendations -Passed swallow eval but NPO for now for angiography in AM, IVF ordered    2.  Alcohol use disorder:  Has previous history of withdrawals. -Thiamine and folate -CIWA scoring with on-demand lorazepam  3. HTN:  -Permissive hypertension for now, hold atenolol, amlodipine, which son does not believe he actually takes -Labetalol for SBP greater than 220/120  4.  COPD:  No active disease not on maintenance inhalers  5.  Smoking:  Patient is not alert enough for smoking cessation  education  6.  Legally blind        DVT prophylaxis: Lovenox  Code Status: Full code Family Communication: Son by phone  Disposition Plan: Anticipate Stroke work up as above and consult to ancillary services. Consults called: Neurology, Dr. Erlinda Hong has seen patient. Admission status: Telemetry, INPATIENT status  Core measures: -VTE prophylaxis ordered at time of admission -Aspirin ordered at admission -Atrial fibrillation: not present on admission -tPA not given because of outside window (paresthesias night before) -Dysphagia screen ordered in ER -Lipids ordered -PT eval ordered     Medical decision making: Patient seen at 4:27 PM on 08/02/2020.  The patient was discussed with Kennith Maes, PA-C. What exists of the patient's chart was reviewed in depth and summarized above.    This isa  High mortality condition.     [place]

## 2020-08-02 NOTE — ED Provider Notes (Signed)
Patient presents today with right sided weakness.  Symptoms started yesterday evening per son.  Patient with obvious right-sided weakness, right arm drip, decreased strength in the right arm and leg.  No obvious facial droop.  Speech slightly slurred Sending to the ER for further evaluation   Orvan July, NP 08/02/20 1308

## 2020-08-02 NOTE — ED Triage Notes (Addendum)
Per son pt went to bed approx 10 pm last night, woke this am 0900 which is the LKW, approx noon today pt lost his balance unable to put his pants on. Right side weakness and right side facial droop. Pt ambulatory on his on denies using any devices for ambulation. Grips unequal to Right side  Son also reports pt a heavy drinker last drank unknown, possibly this am

## 2020-08-02 NOTE — ED Notes (Signed)
Patient return from CT , son at bedside, states patient was c/o right arm numbness 7 pm last night, states he went to bed at 8 pm lst night, states patient got up at 0930 was talking to him at the table appear normal, went and layed down states he heard a thud and found his dad trying to put on his pants, patient was having trouble keeping his balance , Son states patient has peripheral vision only. States patient drinks 12 pack/day.

## 2020-08-02 NOTE — ED Notes (Signed)
Patient is being discharged from the Urgent [facility] and sent to the Emergency Department via wheelchair by staff. Per Loura Halt, NP, patient is stable but in need of higher level of care due to stroke like symptoms. Patient is aware and verbalizes understanding of plan of care. There were no vitals filed for this visit.

## 2020-08-02 NOTE — ED Notes (Signed)
Patient has weakness in right side.  Family member reports "trouble " with right arm starting yesterday.  Right arm drifts.  Patient does not have any facial drooping.    traci bast, np evaluated patient in department, discussed with patient and family need for ED intervention.

## 2020-08-03 ENCOUNTER — Inpatient Hospital Stay (HOSPITAL_COMMUNITY): Payer: Medicare Other

## 2020-08-03 DIAGNOSIS — I739 Peripheral vascular disease, unspecified: Secondary | ICD-10-CM

## 2020-08-03 DIAGNOSIS — F17200 Nicotine dependence, unspecified, uncomplicated: Secondary | ICD-10-CM

## 2020-08-03 DIAGNOSIS — I6521 Occlusion and stenosis of right carotid artery: Secondary | ICD-10-CM

## 2020-08-03 DIAGNOSIS — E7800 Pure hypercholesterolemia, unspecified: Secondary | ICD-10-CM

## 2020-08-03 HISTORY — PX: IR ANGIO VERTEBRAL SEL VERTEBRAL UNI R MOD SED: IMG5368

## 2020-08-03 HISTORY — PX: IR ANGIO INTRA EXTRACRAN SEL COM CAROTID INNOMINATE BILAT MOD SED: IMG5360

## 2020-08-03 HISTORY — PX: IR US GUIDE VASC ACCESS RIGHT: IMG2390

## 2020-08-03 HISTORY — PX: IR ANGIOGRAM EXTREMITY LEFT: IMG651

## 2020-08-03 LAB — LIPID PANEL
Cholesterol: 102 mg/dL (ref 0–200)
HDL: 35 mg/dL — ABNORMAL LOW (ref >40)
LDL Cholesterol: 47 mg/dL (ref 0–99)
Total CHOL/HDL Ratio: 2.9 RATIO
Triglycerides: 98 mg/dL (ref <150)
VLDL: 20 mg/dL (ref 0–40)

## 2020-08-03 LAB — HEMOGLOBIN A1C
Hgb A1c MFr Bld: 6 % — ABNORMAL HIGH (ref 4.8–5.6)
Mean Plasma Glucose: 125.5 mg/dL

## 2020-08-03 LAB — MAGNESIUM: Magnesium: 1.8 mg/dL (ref 1.7–2.4)

## 2020-08-03 MED ORDER — FENTANYL CITRATE (PF) 100 MCG/2ML IJ SOLN
INTRAMUSCULAR | Status: AC | PRN
  Administered 2020-08-03: 25 ug via INTRAVENOUS

## 2020-08-03 MED ORDER — SODIUM CHLORIDE 0.9 % IV SOLN: INTRAVENOUS | Status: DC

## 2020-08-03 MED ORDER — NITROGLYCERIN 1 MG/10 ML FOR IR/CATH LAB
INTRA_ARTERIAL | Status: AC
  Filled 2020-08-03: qty 10

## 2020-08-03 MED ORDER — LIDOCAINE HCL 1 % IJ SOLN
INTRAMUSCULAR | Status: AC | PRN
  Administered 2020-08-03: 10 mL

## 2020-08-03 MED ORDER — VERAPAMIL HCL 2.5 MG/ML IV SOLN: INTRA_ARTERIAL | Status: AC | PRN

## 2020-08-03 MED ORDER — VERAPAMIL HCL 2.5 MG/ML IV SOLN
INTRAVENOUS | Status: AC
  Filled 2020-08-03: qty 2

## 2020-08-03 MED ORDER — FENTANYL CITRATE (PF) 100 MCG/2ML IJ SOLN
INTRAMUSCULAR | Status: AC
  Filled 2020-08-03: qty 2

## 2020-08-03 MED ORDER — MIDAZOLAM HCL 2 MG/2ML IJ SOLN
INTRAMUSCULAR | Status: AC
  Filled 2020-08-03: qty 2

## 2020-08-03 MED ORDER — MIDAZOLAM HCL 2 MG/2ML IJ SOLN
INTRAMUSCULAR | Status: AC | PRN
  Administered 2020-08-03: 1 mg via INTRAVENOUS

## 2020-08-03 MED ORDER — LIDOCAINE HCL 1 % IJ SOLN
INTRAMUSCULAR | Status: AC
  Filled 2020-08-03: qty 20

## 2020-08-03 MED ORDER — HEPARIN SODIUM (PORCINE) 1000 UNIT/ML IJ SOLN
INTRAMUSCULAR | Status: AC
  Filled 2020-08-03: qty 1

## 2020-08-03 NOTE — Progress Notes (Signed)
SLP Cancellation Note  Patient Details Name: Nathan Hart MRN: [id number] DOB: [birth year]   Cancelled treatment:       Reason Eval/Treat Not Completed: Patient at procedure or test/unavailable Speech/Language Evaluation attempted. Unfortunately, pt was off unit to interventional radiology. Will continue efforts.  Kijuan Gallicchio P. Emmalee Solivan, M.S., [place] Pathologist Acute Rehabilitation Services Pager: [place] 08/03/2020, 2:42 PM

## 2020-08-03 NOTE — Progress Notes (Signed)
CM unable to provide SA counseling at this time as pt is only oriented x 2. CM following.

## 2020-08-03 NOTE — Progress Notes (Signed)
STROKE TEAM PROGRESS NOTE   SUBJECTIVE (INTERVAL HISTORY) His son is at the bedside.  Overall his condition is gradually improving.  Patient lying in bed for bed rest, BP stable on the higher end.  Right upper extremity strength improved from yesterday but still has weakness around right hand.  Had angiogram with Dr. Estanislado Pandy, showed left CCA distal occlusion, right ICA stent patent.  No endovascular procedure offered.  Continue medical management.   OBJECTIVE Temp:  [97.7 F (36.5 C)-98.7 F (37.1 C)] 98.7 F (37.1 C) (08/10 1837) Pulse Rate:  [59-103] 84 (08/10 1837) Cardiac Rhythm: Sinus tachycardia (08/10 1952) Resp:  [14-25] 22 (08/10 1837) BP: (110-197)/(71-168) 182/92 (08/10 1837) SpO2:  [92 %-100 %] 95 % (08/10 1837)  Recent Labs  Lab 08/02/20 1319  GLUCAP 126*   Recent Labs  Lab 08/02/20 1320 08/02/20 1325 08/03/20 0624  NA 138 140  --   K 4.0 4.0  --   CL 103 103  --   CO2 25  --   --   GLUCOSE 131* 131*  --   BUN 10 10  --   CREATININE 0.64 0.50*  --   CALCIUM 8.8*  --   --   MG  --   --  1.8   Recent Labs  Lab 08/02/20 1320  AST 28  ALT 29  ALKPHOS 68  BILITOT 1.3*  PROT 7.3  ALBUMIN 3.4*   Recent Labs  Lab 08/02/20 1320 08/02/20 1325  WBC 8.1  --   NEUTROABS 6.3  --   HGB 11.0* 12.6*  HCT 39.5 37.0*  MCV 73.3*  --   PLT 291  --    No results for input(s): CKTOTAL, CKMB, CKMBINDEX, TROPONINI in the last 168 hours. Recent Labs    08/02/20 1320  LABPROT 14.0  INR 1.1   Recent Labs    08/02/20 2015  COLORURINE YELLOW  LABSPEC 1.039*  PHURINE 6.0  GLUCOSEU NEGATIVE  HGBUR NEGATIVE  BILIRUBINUR NEGATIVE  KETONESUR NEGATIVE  PROTEINUR NEGATIVE  NITRITE NEGATIVE  LEUKOCYTESUR NEGATIVE       Component Value Date/Time   CHOL 102 08/03/2020 0624   TRIG 98 08/03/2020 0624   HDL 35 (L) 08/03/2020 0624   CHOLHDL 2.9 08/03/2020 0624   VLDL 20 08/03/2020 0624   LDLCALC 47 08/03/2020 0624   Lab Results  Component Value Date    HGBA1C 6.0 (H) 08/03/2020      Component Value Date/Time   LABOPIA NONE DETECTED 08/02/2020 2015   COCAINSCRNUR NONE DETECTED 08/02/2020 2015   LABBENZ NONE DETECTED 08/02/2020 2015   AMPHETMU NONE DETECTED 08/02/2020 2015   THCU NONE DETECTED 08/02/2020 2015   LABBARB NONE DETECTED 08/02/2020 2015    Recent Labs  Lab 08/02/20 2015  ETH <10    I have personally reviewed the radiological images below and agree with the radiology interpretations.  CT Code Stroke CTA Head W/WO contrast  Result Date: 08/02/2020 CLINICAL DATA:  Neuro deficit EXAM: CT ANGIOGRAPHY HEAD AND NECK TECHNIQUE: Multidetector CT imaging of the head and neck was performed using the standard protocol during bolus administration of intravenous contrast. Multiplanar CT image reconstructions and MIPs were obtained to evaluate the vascular anatomy. Carotid stenosis measurements (when applicable) are obtained utilizing NASCET criteria, using the distal internal carotid diameter as the denominator. CONTRAST:  164mL OMNIPAQUE IOHEXOL 350 MG/ML SOLN COMPARISON:  Same day noncontrast head CT and CT cerebral perfusion. 09/08/2019 CT neck. FINDINGS: CTA NECK FINDINGS Aortic arch: Standard branching. Imaged portion  shows no evidence of aneurysm or dissection. No significant stenosis of the major arch vessel origins. Calcified atheromatous plaque. Right carotid system: No evidence of dissection, stenosis (50% or greater) or occlusion. Calcified atheromatous plaque at the bifurcation. Patent right stent spanning the distal common/proximal internal carotid arteries. Left carotid system: The proximal common left carotid artery demonstrates complete occlusion shortly after its takeoff to the level of the bifurcation where there is extensive calcified and noncalcified atheromatous plaque. There is a distal reconstitution of the left internal carotid and external carotid arteries. The left internal carotid artery demonstrates comparatively  diminished opacification. Vertebral arteries: Dominant right vertebral artery. Diminutive left vertebral artery with V4 segment atherosclerotic calcifications. No definite focal high-grade narrowing however diffuse vessel wall irregularity likely reflects atheromatous disease. No evidence of dissection, stenosis (50% or greater) or occlusion. Skeleton: Multilevel spondylosis with reversal of cervical lordosis. Grade 1 C2-3 and C7-T1 anterolisthesis. Grade 1 C5-6 and C6-7 retrolisthesis. No acute osseous abnormality. Other neck: No adenopathy.  No soft tissue mass. Upper chest: Emphysema.  Biapical atelectasis. Review of the MIP images confirms the above findings CTA HEAD FINDINGS Anterior circulation: Asymmetrically diminished opacification of the left internal carotid artery. Bilateral carotid siphon atherosclerotic calcifications, right greater than left. Distal left ICA cervical segment and petrous segment atherosclerotic calcifications. Diminutive left M1 segment. No aneurysm or vascular malformation. Posterior circulation: Dominant right vertebral artery. Dominant right PCA. Fetal origin of the left PCA. Mild-to-moderate irregularity involving the left P2 segment likely secondary to atheromatous disease. No aneurysm, or vascular malformation. Asymmetrically diminished opacification of the left parietooccipital cortical branches. Venous sinuses: No evidence of thrombosis. Anatomic variants: Bilateral PCOM hypoplasia. Review of the MIP images confirms the above findings IMPRESSION: 1. 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 Distal reconstitution of the left internal and external carotid arteries. 2. Asymmetrically diminished opacification of the left internal carotid artery to the terminus. 3. Mild to moderate narrowing and irregularity involving the left P2 segment, likely secondary to atheromatous disease. 4. Asymmetrically diminished  opacification of left parietooccipital cortical branches. 5. Patent right common/proximal internal carotid artery stent. 6. Diminutive left vertebral artery with V4 segment atherosclerotic calcifications. These results were called by telephone at the time of interpretation on 08/02/2020 at 2:46 pm to provider Dr. Erlinda Hong, Who verbally acknowledged these results. Electronically Signed   By: Primitivo Gauze M.D.   On: 08/02/2020 14:58   CT Code Stroke CTA Neck W/WO contrast  Result Date: 08/02/2020 CLINICAL DATA:  Neuro deficit EXAM: CT ANGIOGRAPHY HEAD AND NECK TECHNIQUE: Multidetector CT imaging of the head and neck was performed using the standard protocol during bolus administration of intravenous contrast. Multiplanar CT image reconstructions and MIPs were obtained to evaluate the vascular anatomy. Carotid stenosis measurements (when applicable) are obtained utilizing NASCET criteria, using the distal internal carotid diameter as the denominator. CONTRAST:  1107mL OMNIPAQUE IOHEXOL 350 MG/ML SOLN COMPARISON:  Same day noncontrast head CT and CT cerebral perfusion. 09/08/2019 CT neck. FINDINGS: CTA NECK FINDINGS Aortic arch: Standard branching. Imaged portion shows no evidence of aneurysm or dissection. No significant stenosis of the major arch vessel origins. Calcified atheromatous plaque. Right carotid system: No evidence of dissection, stenosis (50% or greater) or occlusion. Calcified atheromatous plaque at the bifurcation. Patent right stent spanning the distal common/proximal internal carotid arteries. Left carotid system: The proximal common left carotid artery demonstrates complete occlusion shortly after its takeoff to the level of the bifurcation where  there is extensive calcified and noncalcified atheromatous plaque. There is a distal reconstitution of the left internal carotid and external carotid arteries. The left internal carotid artery demonstrates comparatively diminished opacification. Vertebral  arteries: Dominant right vertebral artery. Diminutive left vertebral artery with V4 segment atherosclerotic calcifications. No definite focal high-grade narrowing however diffuse vessel wall irregularity likely reflects atheromatous disease. No evidence of dissection, stenosis (50% or greater) or occlusion. Skeleton: Multilevel spondylosis with reversal of cervical lordosis. Grade 1 C2-3 and C7-T1 anterolisthesis. Grade 1 C5-6 and C6-7 retrolisthesis. No acute osseous abnormality. Other neck: No adenopathy.  No soft tissue mass. Upper chest: Emphysema.  Biapical atelectasis. Review of the MIP images confirms the above findings CTA HEAD FINDINGS Anterior circulation: Asymmetrically diminished opacification of the left internal carotid artery. Bilateral carotid siphon atherosclerotic calcifications, right greater than left. Distal left ICA cervical segment and petrous segment atherosclerotic calcifications. Diminutive left M1 segment. No aneurysm or vascular malformation. Posterior circulation: Dominant right vertebral artery. Dominant right PCA. Fetal origin of the left PCA. Mild-to-moderate irregularity involving the left P2 segment likely secondary to atheromatous disease. No aneurysm, or vascular malformation. Asymmetrically diminished opacification of the left parietooccipital cortical branches. Venous sinuses: No evidence of thrombosis. Anatomic variants: Bilateral PCOM hypoplasia. Review of the MIP images confirms the above findings IMPRESSION: 1. 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 Distal reconstitution of the left internal and external carotid arteries. 2. Asymmetrically diminished opacification of the left internal carotid artery to the terminus. 3. Mild to moderate narrowing and irregularity involving the left P2 segment, likely secondary to atheromatous disease. 4. Asymmetrically diminished opacification of left parietooccipital  cortical branches. 5. Patent right common/proximal internal carotid artery stent. 6. Diminutive left vertebral artery with V4 segment atherosclerotic calcifications. These results were called by telephone at the time of interpretation on 08/02/2020 at 2:46 pm to provider Dr. Erlinda Hong, Who verbally acknowledged these results. Electronically Signed   By: Primitivo Gauze M.D.   On: 08/02/2020 14:58   MR BRAIN WO CONTRAST  Result Date: 08/02/2020 CLINICAL DATA:  Stroke, follow-up EXAM: MRI HEAD WITHOUT CONTRAST TECHNIQUE: Multiplanar, multiecho pulse sequences of the brain and surrounding structures were obtained without intravenous contrast. COMPARISON:  Correlation made with prior CT imaging FINDINGS: Brain: There is reduced diffusion in the left occipital lobe extending into the temporal and parietal lobes. There is also patchy reduced diffusion in the left frontal lobe. A small focus is present in the posterior left thalamus. No significant mass effect. There is minimal susceptibility hypointensity primarily in the left parietooccipital lobes likely reflecting petechial hemorrhage. Chronic infarct centered within the right parietal lobe. Additional chronic infarct of the left basal ganglia and adjacent white matter. Superimposed patchy and confluent areas of T2 hyperintensity in the supratentorial and pontine white matter are nonspecific but probably reflect moderate chronic microvascular ischemic changes. Prominence of the ventricles and sulci reflects generalized parenchymal volume loss. Vascular: Major vessel flow voids at the skull base are preserved. Skull and upper cervical spine: Normal marrow signal is preserved. Sinuses/Orbits: Paranasal sinuses are aerated. Orbits are unremarkable. Other: Sella is unremarkable.  Mastoid air cells are clear. IMPRESSION: Acute infarcts involving left posterior greater than anterior circulations, noting presence of a fetal left PCA on the prior CTA. No significant mass effect.  There is minor petechial hemorrhage. Moderate chronic microvascular ischemic changes.  Chronic infarcts. Electronically Signed   By: Macy Mis M.D.   On: 08/02/2020 16:52  CT CEREBRAL PERFUSION W CONTRAST  Result Date: 08/02/2020 CLINICAL DATA:  Code stroke EXAM: CT PERFUSION BRAIN TECHNIQUE: Multiphase CT imaging of the brain was performed following IV bolus contrast injection. Subsequent parametric perfusion maps were calculated using RAPID software. CONTRAST:  157mL OMNIPAQUE IOHEXOL 350 MG/ML SOLN COMPARISON:  Same day noncontrast head CT.  09/07/2019 head CT. FINDINGS: CT Brain Perfusion Findings: CBF (<30%) Volume: 38mL Perfusion (Tmax>6.0s) volume: 35mL Mismatch Volume: 9mL ASPECTS on noncontrast CT Head: 10 at 1:50 p.m. Today. Infarct Core: 33 mL Infarction Location:Left parietooccipital region. IMPRESSION: 33 mL core infarct involving left parietooccipital region with surrounding ischemic penumbra of 45 mL. Electronically Signed   By: Primitivo Gauze M.D.   On: 08/02/2020 13:58   CT HEAD CODE STROKE WO CONTRAST  Result Date: 08/02/2020 CLINICAL DATA:  Code stroke.  Neuro deficit EXAM: CT HEAD WITHOUT CONTRAST TECHNIQUE: Contiguous axial images were obtained from the base of the skull through the vertex without intravenous contrast. COMPARISON:  09/07/2019 head CT. FINDINGS: Brain: Ill-defined hypodense region involving the left parietooccipital region with blurring of the gray-white interface is concerning for acute infarct (5:55). Sequela of remote right MCA territory and left basal ganglia insults. Background scattered and confluent supratentorial white matter hypodensities are nonspecific however commonly associated with chronic microvascular ischemic changes. No mass lesion. No midline shift, ventriculomegaly or extra-axial fluid collection. Vascular: No hyperdense vessel. Bilateral skull base atherosclerotic calcifications. Skull: Negative for fracture or focal lesion. Sinuses/Orbits:  Normal orbits. Clear paranasal sinuses. No mastoid effusion. Other: None. ASPECTS [facility] Stroke Program Early CT Score) - Ganglionic level infarction (caudate, lentiform nuclei, internal capsule, insula, M1-M3 cortex): 7 - Supraganglionic infarction (M4-M6 cortex): 3 Total score (0-10 with 10 being normal): 10 IMPRESSION: 1. Acute left parietooccipital infarct.  ASPECTS is 10. 2. Sequela of remote right MCA territory and left basal ganglia insults. 3. Chronic microvascular ischemic changes. Code stroke imaging results were communicated on 08/02/2020 at 1:51 pm to provider Dr. Curly Shores Via [employer] secure text paging. Electronically Signed   By: Primitivo Gauze M.D.   On: 08/02/2020 13:53    PHYSICAL EXAM  Temp:  [97.7 F (36.5 C)-98.7 F (37.1 C)] 98.7 F (37.1 C) (08/10 1837) Pulse Rate:  [59-103] 84 (08/10 1837) Resp:  [14-25] 22 (08/10 1837) BP: (110-197)/(71-168) 182/92 (08/10 1837) SpO2:  [92 %-100 %] 95 % (08/10 1837)  General - well nourished, well developed, in no apparent distress.    Ophthalmologic - fundi not visualized due to noncooperation.    Cardiovascular - regular rhythm and rate  Mental Status -  Level of arousal and orientation to self, place and son were intact.  However, not orientated to age or time. Language including expression, naming, repetition, comprehension, reading, and writing was assessed and found intact.  Cranial Nerves II - XII - II - Visual field difficult test due to legally blind bilaterally.  But seems to have right hemianopia III, IV, VI - Extraocular movements intact, no gaze deviation. V - Facial sensation intact bilaterally. VII - left nasolabial fold flattening, likely chronic. VIII - Hearing & vestibular intact bilaterally. X - Palate elevates symmetrically. XI - Chin turning & shoulder shrug intact bilaterally. XII - Tongue protrusion intact.  Motor Strength - The patient's strength was normal left upper extremities, however right  upper extremity mild drift 4/5 with decreased finger grip 3/5.  Bilateral lower extremity equal strength 5/5.  Motor Tone & Bulk - Muscle tone was assessed at the neck and appendages and was normal.  Bulk was normal and fasciculations were absent.   Reflexes - The patient's reflexes were normal in all extremities and he had no pathological reflexes.  Sensory - Light touch, temperature/pinprick were assessed and were normal.    Coordination - The patient had normal movements in the hands with no ataxia or dysmetria, however slow on the right.  Tremor was absent.  Gait and Station - deferred   ASSESSMENT/PLAN Nathan Hart is a 64 y.o. male with history of hypertension, PAD, CVA, COPD, legally blind bilaterally, smoker and alcohol user admitted for right-sided weakness and fall. No tPA given due to outside window, and established stroke.    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    CTA head and neck showed left CCA possible occlusion, and left ICA reconstitution with slow flow.  Right ICA stent patent, bilateral siphon high-grade stenosis.  Left P2 high-grade stenosis.    CTP 33 cc core with 45 penumbra.  MRI  Acute infarcts involving left posterior greater than anterior circulations, noting presence of a fetal left PCA on the prior CTA  IR left CCA mid to distal occlusion, left MCA and ACA patent, right ICA stent patent.  2D Echo  pending  LDL 47  HgbA1c 6.0  Lovenox for VTE prophylaxis  No antithrombotic prior to admission, now on aspirin 325 mg daily and clopidogrel 75 mg daily DAPT for 3 months and then aspirin alone.  Patient counseled to be compliant with his antithrombotic medications  Ongoing aggressive stroke risk factor management  Therapy recommendations: Pending  Disposition: Pending  Carotid  stenosis  08/2019 cerebral angiogram showed right ICA 90% stenosis, left CCA 50% stenosis, left ICA 50% stenosis.  Left fetal PCA.  Left VA occluded with distal reconstitution  09/2019 status post right carotid stenting with Dr. Estanislado Pandy  This admission CTA head neck left CCA occlusion, right ICA stent patent  This admission cerebral angiogram left CCA mid to distal occlusion, right ICA stent patent  No endovascular procedure offered, continue medical management.  Long-term BP goal 130-150  Hypertension . Stable on the high and . Permissive hypertension (OK if <220/120) for 24-48 hours post stroke and then gradually normalized within 5-7 days.  Long term BP goal 130-150  DC bedrest  Orthostatic vitals with PT in the a.m.  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 He expressed understanding and appreciation.   Rosalin Hawking, MD PhD Stroke Neurology 08/03/2020 8:50 PM    To contact Stroke Continuity provider, please refer to [email or web address] After hours, contact General Neurology

## 2020-08-03 NOTE — Evaluation (Signed)
Physical Therapy Evaluation Patient Details Name: Nathan Hart MRN: [id number] DOB: 1956-10-03 Today's Date: 08/03/2020   History of Present Illness  64 yo male with onset of acute R side hemiparesis was brought to hosp, noted L frontal, thalamic and parietal strokes.  PMHx:  lumbar surgeries, L Eye limited peripheral vision, COPD, empyema, EtOH abuse, PVD, PNA, stroke, R eye blind, R ACL surgery, clavicle surgery  Clinical Impression  Pt was seen for mobility with progression of goals of therapy planned.  Pt is unable to verbalize his deficits, but does recall some details of living with his son.  Make progress to get pt done with therapy faster to go home.  Pt is in agreement with therapy and will work to increase independence and speed of recovery.    Follow Up Recommendations SNF;Supervision for mobility/OOB    Equipment Recommendations  None recommended by PT    Recommendations for Other Services       Precautions / Restrictions Precautions Precautions: Fall Restrictions Weight Bearing Restrictions: No Other Position/Activity Restrictions: BLE strength WFL      Mobility  Bed Mobility Overal bed mobility: Modified Independent             General bed mobility comments: mod I to sit up in bed, to roll  Transfers                 General transfer comment: declined to attempt  Ambulation/Gait             General Gait Details: would not attempt  Stairs            Wheelchair Mobility    Modified Rankin (Stroke Patients Only)       Balance Overall balance assessment: Needs assistance Sitting-balance support: Feet supported Sitting balance-Leahy Scale: Fair     Standing balance support:  (could not get pt to try to stand)                                 Pertinent Vitals/Pain Pain Assessment: No/denies pain    Home Living Family/patient expects to be discharged to:: Private residence Living Arrangements: Children (PT  cannot remember home details) Available Help at Discharge: Family             Additional Comments: unable to remember his home    Prior Function Level of Independence:  (pt cannot remember)               Hand Dominance   Dominant Hand: Right    Extremity/Trunk Assessment   Upper Extremity Assessment Upper Extremity Assessment: Defer to OT evaluation    Lower Extremity Assessment Lower Extremity Assessment: Overall WFL for tasks assessed    Cervical / Trunk Assessment Cervical / Trunk Assessment: Normal  Communication   Communication: No difficulties  Cognition Arousal/Alertness: Awake/alert Behavior During Therapy: Anxious Overall Cognitive Status: No family/caregiver present to determine baseline cognitive functioning                                 General Comments: cannot recall home details and not clear if this is baseline      General Comments General comments (skin integrity, edema, etc.): Pt was unable to commit to moving, was unable to stand or even sit on side of bed    Exercises     Assessment/Plan    PT Assessment  Patient needs continued PT services  PT Problem List Decreased strength;Decreased range of motion;Decreased activity tolerance;Decreased balance;Decreased mobility;Decreased coordination;Decreased knowledge of use of DME;Decreased safety awareness       PT Treatment Interventions DME instruction;Gait training;Stair training;Functional mobility training;Therapeutic activities;Therapeutic exercise;Balance training;Neuromuscular re-education;Patient/family education    PT Goals (Current goals can be found in the Care Plan section)  Acute Rehab PT Goals Patient Stated Goal: none stated PT Goal Formulation: With patient Time For Goal Achievement: 08/17/20 Potential to Achieve Goals: Good    Frequency Min 3X/week   Barriers to discharge   unclear what his challenges are for home    Co-evaluation                AM-PAC PT "6 Clicks" Mobility  Outcome Measure Help needed turning from your back to your side while in a flat bed without using bedrails?: A Little Help needed moving from lying on your back to sitting on the side of a flat bed without using bedrails?: A Little Help needed moving to and from a bed to a chair (including a wheelchair)?: A Little Help needed standing up from a chair using your arms (e.g., wheelchair or bedside chair)?: A Little Help needed to walk in hospital room?: A Little   6 Click Score: 15    End of Session Equipment Utilized During Treatment: Oxygen Activity Tolerance: Patient limited by fatigue Patient left: in bed;with call bell/phone within reach Nurse Communication: Mobility status PT Visit Diagnosis: Unsteadiness on feet (R26.81);Difficulty in walking, not elsewhere classified (R26.2)    Time: 4825-0037 PT Time Calculation (min) (ACUTE ONLY): 14 min   Charges:   PT Evaluation $PT Eval Moderate Complexity: 1 Mod         Ramond Dial 08/03/2020, 9:55 PM  Mee Hives, PT MS Acute Rehab Dept. Number: [place]

## 2020-08-03 NOTE — Progress Notes (Signed)
PROGRESS NOTE  Nathan Hart  DOB: 1956-08-10  PCP: Patient, No Pcp Per FBP:[id number]  DOA: 08/02/2020  LOS: 1 day   Chief Complaint  Patient presents with  . Code Stroke   Brief narrative: Nathan Hart is a 64 y.o. male with PMH of chronic alcoholism, chronic smoking, CVA, right carotid stenosis status post stenting 2020, PAD, HTN, COPD, legally blind bilaterally Patient presented to the ED on 08/02/2020 with complaint of right sided weakness and a fall.   At the time of presentation in the ED, patient had right arm drift and hand weakness.  He was disoriented.  CT head showed left parieto-occipital acute infarct, and old right CR infarct.   MRI brain showed acute infarcts involving left posterior greater than anterior circulations CTA head and neck showed left CCA possible occlusion, and left ICA reconstitution with slow flow.  Right CCA stent patent, bilateral siphon high-grade stenosis.  Left P2 high-grade stenosis.   Not a TPA candidate because of late presentation. Patient was admitted to hospital service for further stroke work-up. Neurology consultation was obtained.  IR consulted as well.  Patient has been planned for cerebral angiogram.  Subjective: Patient was seen and examined this morning.  Remains in ED waiting for bed availability inpatient. Not in physical distress. Oriented to place, not to time or person.  Chart reviewed. No fever.  Heart rate in 60s to 80s.  Blood pressure has been persistently elevated between 160s to 190s.  Breathing in room air. LDL 47, HDL 35,  A1c 6  Assessment/Plan: Acute left parieto-occipital infarct History of stroke in the past -Presented with right-sided weakness and fall -Imaging findings as above showing acute left parieto-occipital infarcts. -Neurology and IR consultation obtained. -PT/OT/ST eval pending. -LDL 47, HDL 35, A1c 6 -Echocardiogram pending. -Daily aspirin 325 mg plus Plavix 75 mg plus high dose  statin -Permissive hypertension for now up to 220/120 mmHg, labetalol PRN for higher pressures; flatten HOB and bed rest to maintain pressure >160 -Continue frequent neurochecks -Continue to monitor in telemetry.  Intracranial artery stenosis History of right carotid artery stenosis status post stenting in 2020 -CTA head and neck showed left CCA possible occlusion, and left ICA reconstitution with slow flow.  Right CCA stent patent, bilateral siphon high-grade stenosis.  Left P2 high-grade stenosis.   -Seen by IR.  Noted a plan for cerebral angiogram today.  Essential hypertension -Permissive hypertension allowed, Labetalol for SBP greater than 220/120 -Patient was supposed to be on atenolol and amlodipine at home.  Questionable compliance. -Continue to monitor blood pressure.  Chronic alcoholism -Continues to drink. -On CIWA protocol, thiamine and folate. -Counseled to quit  Chronic everyday smoker COPD -Respiratory status stable -Inhalers as needed -Counseled to stop smoking  Legally blind   Mobility: PT/OT eval Code Status:   Code Status: Full Code  Nutritional status: Body mass index is 27.44 kg/m.     Diet Order            Diet NPO time specified Except for: Sips with Meds  Diet effective now                 DVT prophylaxis: enoxaparin (LOVENOX) injection 40 mg Start: 08/02/20 2000   Antimicrobials:  None Fluid: None  Consultants: Neurology, neuroradiology Family Communication:  Family not at bedside  Status is: Inpatient  Remains inpatient appropriate because:Ongoing diagnostic testing needed not appropriate for outpatient work up and IV treatments appropriate due to intensity of illness or inability to take  PO   Dispo: The patient is from: Home              Anticipated d/c is to: Pending PT eval              Anticipated d/c date is: 2 days              Patient currently is not medically stable to d/c.       Infusions:  . sodium chloride 75  mL/hr at 08/03/20 0731    Scheduled Meds: . aspirin EC  325 mg Oral Daily   Or  . aspirin  300 mg Rectal Daily  . atorvastatin  80 mg Oral Daily  . clopidogrel  75 mg Oral Daily  . enoxaparin (LOVENOX) injection  40 mg Subcutaneous Q24H  . folic acid  1 mg Oral Daily  . multivitamin with minerals  1 tablet Oral Daily  . thiamine  100 mg Oral Daily   Or  . thiamine  100 mg Intravenous Daily    Antimicrobials: Anti-infectives (From admission, onward)   None      PRN meds: acetaminophen **OR** acetaminophen (TYLENOL) oral liquid 160 mg/5 mL **OR** acetaminophen, labetalol, LORazepam, LORazepam **OR** LORazepam, senna-docusate   Objective: Vitals:   08/03/20 0633 08/03/20 0643  BP: (!) 184/82 (!) 191/78  Pulse: 82 70  Resp: 17 16  Temp:    SpO2: 97% 96%    Intake/Output Summary (Last 24 hours) at 08/03/2020 0734 Last data filed at 08/02/2020 2231 Gross per 24 hour  Intake 419.4 ml  Output --  Net 419.4 ml   Filed Weights   08/02/20 1313  Weight: 77.1 kg   Weight change:  Body mass index is 27.44 kg/m.   Physical Exam: General exam: Appears calm and comfortable.  Not in physical distress Skin: No rashes, lesions or ulcers. HEENT: Atraumatic, normocephalic, supple neck, no obvious bleeding Lungs: Clear to auscultation bilaterally CVS: Regular rate and rhythm, no murmur GI/Abd soft, nontender, nondistended, bowel sound present CNS: Alert, awake, oriented to place only Psychiatry: Mood appropriate Extremities: No pedal edema, no calf tenderness  Data Review: I have personally reviewed the laboratory data and studies available.  Recent Labs  Lab 08/02/20 1320 08/02/20 1325  WBC 8.1  --   NEUTROABS 6.3  --   HGB 11.0* 12.6*  HCT 39.5 37.0*  MCV 73.3*  --   PLT 291  --    Recent Labs  Lab 08/02/20 1320 08/02/20 1325 08/03/20 0624  NA 138 140  --   K 4.0 4.0  --   CL 103 103  --   CO2 25  --   --   GLUCOSE 131* 131*  --   BUN 10 10  --    CREATININE 0.64 0.50*  --   CALCIUM 8.8*  --   --   MG  --   --  1.8   Lab Results  Component Value Date   HGBA1C 6.0 (H) 08/03/2020       Component Value Date/Time   CHOL 167 09/09/2019 0451   TRIG 79 09/09/2019 0451   HDL 70 09/09/2019 0451   CHOLHDL 2.4 09/09/2019 0451   VLDL 16 09/09/2019 0451   LDLCALC 81 09/09/2019 0451   Signed, Terrilee Croak, MD [facility] Pager: [phone] (Secure Chat preferred). 08/03/2020

## 2020-08-03 NOTE — Plan of Care (Signed)
  Problem: Activity: Goal: Risk for activity intolerance will decrease Outcome: Progressing   Problem: Coping: Goal: Level of anxiety will decrease Outcome: Progressing   Problem: Elimination: Goal: Will not experience complications related to bowel motility Outcome: Progressing   Problem: Pain Managment: Goal: General experience of comfort will improve Outcome: Progressing   Problem: Safety: Goal: Ability to remain free from injury will improve Outcome: Progressing   

## 2020-08-03 NOTE — Procedures (Signed)
S/P bilateral common carotid ,RT VERT A and Lt subclavian arteriograms. RT rad approach. Findings. 1.Occluded Lt Common carotid in mid to distal one third and Lt VA at origin. 2. Widely patent  prox RT ICA stent. S.Dylynn Ketner MD

## 2020-08-03 NOTE — Consult Note (Signed)
Chief Complaint: Patient was seen in consultation today for Cerebral arteriogram Chief Complaint  Patient presents with  . Code Stroke   at the request of Dr Lavera Guise   Supervising Physician: Luanne Bras  Patient Status: [facility] - ED  History of Present Illness: Nathan Hart is a 64 y.o. male   Hx ETOH use Smoker/COPD Hx Rt Carotid stent placement 2020  Came to ED yesterday afternoon when son heard pt fall at home Apparently pt had felt some right arm numbness night before-- but subsided Then when awoke yesterday-- fell to floor because could not use Rt side  Was seen in ED with Dr Erlinda Hong Assessment: 64 y.o. male with PMH of hypertension, PAD, CVA, COPD, legally blind bilaterally, smoker and alcohol user presented to ER for right-sided weakness which has improved in ER.  Currently only had right arm drift and hand weakness.  NIH score 7 largely due to disorientation and symptom from legally blind and old stroke in the past.  Last seen normal 8 AM. CT showed left parieto-occipital acute infarct, and old right CR infarct.  CTA head and neck showed left CCA possible occlusion, and left ICA reconstitution with slow flow.  Right CCA stent patent, bilateral siphon high-grade stenosis.  Left P2 high-grade stenosis.  CTP 33 cc core with 45 penumbra.  Patient not a TPA candidate given outside window and establish stroke.  Patient not IR candidate given mild symptoms and insignificant mismatch.  Given patient severe vasculopathy, discussed with Dr. Estanislado Pandy, will attempt cerebral angiogram in the morning for further evaluation.  Stat aspirin Plavix and high-dose statin.  Permissive hypertension.  And ideally no lower than 160.  Stroke Risk Factors - carotid stenosis, hypertension and smoking  Request for cerebral arteriogram per Dr Erlinda Hong Dr Estanislado Pandy aware  Pt is now resting and comfortable this am Easily arousable Pleasant  Past Medical History:  Diagnosis Date  . Abnormal  peripheral vision of left eye   . Blind right eye   . COPD (chronic obstructive pulmonary disease) (HCC)     home o2 hs and prn  . Dyspnea    with activity  . Empyema lung ([place])   . ETOH abuse   . HTN (hypertension)   . Peripheral vascular disease ([place])   . Pneumonia 2020  . Stroke [facility])     Past Surgical History:  Procedure Laterality Date  . BACK SURGERY     disc lumar x 2   . CLAVICLE SURGERY Right   . IR ANGIO INTRA EXTRACRAN SEL COM CAROTID INNOMINATE BILAT MOD SED  09/12/2019  . IR ANGIO VERTEBRAL SEL SUBCLAVIAN INNOMINATE UNI L MOD SED  09/12/2019  . IR ANGIO VERTEBRAL SEL VERTEBRAL UNI R MOD SED  09/12/2019  . IR INTRAVSC STENT CERV CAROTID W/EMB-PROT MOD SED INCL ANGIO  09/24/2019  . IR US GUIDE VASC ACCESS RIGHT  09/12/2019  . KNEE SURGERY Right     x 3 ACL  . RADIOLOGY WITH ANESTHESIA N/A 09/24/2019   Procedure: IR WITH ANESTHESIA/STENTING;  Surgeon: Luanne Bras, MD;  Location: [place];  Service: Radiology;  Laterality: N/A;  . [place]    Allergies: Patient has no known allergies.  Medications: Prior to Admission medications   Medication Sig Start Date End Date Taking? Authorizing Provider  acetaminophen (TYLENOL) 325 MG tablet Take 650 mg by mouth every 6 (six) hours as needed for moderate pain or headache.   Yes [provider]  albuterol (PROVENTIL) (2.5 MG/3ML) 0.083% nebulizer solution Take 3 mLs (2.5 mg total) by nebulization every 6 (six) hours as needed for wheezing or shortness of breath. For breathing 09/09/19  Yes Emokpae, Courage, MD  atenolol (TENORMIN) 50 MG tablet Take 1 tablet (50 mg total) by mouth daily. For BP 09/10/19  Yes Emokpae, Courage, MD  atorvastatin (LIPITOR) 40 MG tablet Take 1 tablet (40 mg total) by mouth every evening. For Cholesterol 09/09/19  Yes Emokpae, Courage, MD  diphenhydrAMINE HCl, Sleep, (ZZZQUIL) 25 MG CAPS Take 25 mg by mouth at bedtime as needed (sleep).   Yes [provider]    folic acid (FOLVITE) 1 MG tablet Take 1 tablet (1 mg total) by mouth daily. 09/10/19  Yes Roxan Hockey, MD     Family History  Problem Relation Age of Onset  . Cancer Mother     Social History   Socioeconomic History  . Marital status: Legally Separated    Spouse name: Not on file  . Number of children: Not on file  . Years of education: Not on file  . Highest education level: Not on file  Occupational History  . Not on file  Tobacco Use  . Smoking status: Current Every Day Smoker    Packs/day: 1.50    Years: 9.00    Pack years: 13.50    Types: Cigarettes  . Smokeless tobacco: Never Used  Vaping Use  . Vaping Use: Never used  Substance and Sexual Activity  . Alcohol use: Yes    Alcohol/week: 20.0 standard drinks    Types: 20 Cans of beer per week  . Drug use: Never  . Sexual activity: Not on file  Other Topics Concern  . Not on file  Social History Narrative  . Not on file   Social Determinants of Health   Financial Resource Strain:   . Difficulty of Paying Living Expenses:   Food Insecurity:   . Worried About Charity fundraiser in the Last Year:   . Arboriculturist in the Last Year:   Transportation Needs:   . Film/video editor (Medical):   Marland Kitchen Lack of Transportation (Non-Medical):   Physical Activity:   . Days of Exercise per Week:   . Minutes of Exercise per Session:   Stress:   . Feeling of Stress :   Social Connections:   . Frequency of Communication with Friends and Family:   . Frequency of Social Gatherings with Friends and Family:   . Attends Religious Services:   . Active Member of Clubs or Organizations:   . Attends Archivist Meetings:   Marland Kitchen Marital Status:     Review of Systems: A 12 point ROS discussed and pertinent positives are indicated in the HPI above.  All other systems are negative.  Review of Systems  Constitutional: Positive for activity change. Negative for fatigue and fever.  HENT: Negative for tinnitus,  trouble swallowing and voice change.   Eyes:       Legally blind-- macular degeneration  Respiratory: Negative for shortness of breath.   Cardiovascular: Negative for chest pain.  Gastrointestinal: Negative for abdominal pain.  Musculoskeletal: Negative for back pain.  Neurological: Positive for weakness and numbness. Negative for dizziness, tremors, seizures, syncope, facial asymmetry, speech difficulty, light-headedness and headaches.  Psychiatric/Behavioral: Positive for decreased concentration. Negative for behavioral problems.    Vital Signs: BP (!) 191/78   Pulse 70   Temp (!) 97.3 F (36.3 C) (Oral)   Resp  16   Ht 5\' 6"  (1.676 m)   Wt 170 lb (77.1 kg)   SpO2 96%   BMI 27.44 kg/m   Physical Exam Vitals reviewed.  Constitutional:      Comments: Face symmetrical Tongue midline  HENT:     Mouth/Throat:     Mouth: Mucous membranes are moist.  Cardiovascular:     Rate and Rhythm: Normal rate and regular rhythm.     Heart sounds: Normal heart sounds.  Pulmonary:     Effort: Pulmonary effort is normal.     Breath sounds: Normal breath sounds.  Abdominal:     Palpations: Abdomen is soft.  Musculoskeletal:     Comments: Moving left arm well Rt arm is slower and weaker-- but moves to command Moves both legs equally Can bend at knees; good strength    Skin:    General: Skin is warm.  Neurological:     Mental Status: He is alert. Mental status is at baseline.  Psychiatric:     Comments: Can follow commands Answers questions-- seems to understand why he is here  Spoke to Ardis Hughs--- he has consented to Cerebral arteriogram procedure     Imaging: CT Code Stroke CTA Head W/WO contrast  Result Date: 08/02/2020 CLINICAL DATA:  Neuro deficit EXAM: CT ANGIOGRAPHY HEAD AND NECK TECHNIQUE: Multidetector CT imaging of the head and neck was performed using the standard protocol during bolus administration of intravenous contrast. Multiplanar CT image reconstructions and  MIPs were obtained to evaluate the vascular anatomy. Carotid stenosis measurements (when applicable) are obtained utilizing NASCET criteria, using the distal internal carotid diameter as the denominator. CONTRAST:  157mL OMNIPAQUE IOHEXOL 350 MG/ML SOLN COMPARISON:  Same day noncontrast head CT and CT cerebral perfusion. 09/08/2019 CT neck. FINDINGS: CTA NECK FINDINGS Aortic arch: Standard branching. Imaged portion shows no evidence of aneurysm or dissection. No significant stenosis of the major arch vessel origins. Calcified atheromatous plaque. Right carotid system: No evidence of dissection, stenosis (50% or greater) or occlusion. Calcified atheromatous plaque at the bifurcation. Patent right stent spanning the distal common/proximal internal carotid arteries. Left carotid system: The proximal common left carotid artery demonstrates complete occlusion shortly after its takeoff to the level of the bifurcation where there is extensive calcified and noncalcified atheromatous plaque. There is a distal reconstitution of the left internal carotid and external carotid arteries. The left internal carotid artery demonstrates comparatively diminished opacification. Vertebral arteries: Dominant right vertebral artery. Diminutive left vertebral artery with V4 segment atherosclerotic calcifications. No definite focal high-grade narrowing however diffuse vessel wall irregularity likely reflects atheromatous disease. No evidence of dissection, stenosis (50% or greater) or occlusion. Skeleton: Multilevel spondylosis with reversal of cervical lordosis. Grade 1 C2-3 and C7-T1 anterolisthesis. Grade 1 C5-6 and C6-7 retrolisthesis. No acute osseous abnormality. Other neck: No adenopathy.  No soft tissue mass. Upper chest: Emphysema.  Biapical atelectasis. Review of the MIP images confirms the above findings CTA HEAD FINDINGS Anterior circulation: Asymmetrically diminished opacification of the left internal carotid artery. Bilateral  carotid siphon atherosclerotic calcifications, right greater than left. Distal left ICA cervical segment and petrous segment atherosclerotic calcifications. Diminutive left M1 segment. No aneurysm or vascular malformation. Posterior circulation: Dominant right vertebral artery. Dominant right PCA. Fetal origin of the left PCA. Mild-to-moderate irregularity involving the left P2 segment likely secondary to atheromatous disease. No aneurysm, or vascular malformation. Asymmetrically diminished opacification of the left parietooccipital cortical branches. Venous sinuses: No evidence of thrombosis. Anatomic variants: Bilateral PCOM hypoplasia. Review of the  MIP images confirms the above findings IMPRESSION: 1. 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 Distal reconstitution of the left internal and external carotid arteries. 2. Asymmetrically diminished opacification of the left internal carotid artery to the terminus. 3. Mild to moderate narrowing and irregularity involving the left P2 segment, likely secondary to atheromatous disease. 4. Asymmetrically diminished opacification of left parietooccipital cortical branches. 5. Patent right common/proximal internal carotid artery stent. 6. Diminutive left vertebral artery with V4 segment atherosclerotic calcifications. These results were called by telephone at the time of interpretation on 08/02/2020 at 2:46 pm to provider Dr. Erlinda Hong, Who verbally acknowledged these results. Electronically Signed   By: Primitivo Gauze M.D.   On: 08/02/2020 14:58   CT Code Stroke CTA Neck W/WO contrast  Result Date: 08/02/2020 CLINICAL DATA:  Neuro deficit EXAM: CT ANGIOGRAPHY HEAD AND NECK TECHNIQUE: Multidetector CT imaging of the head and neck was performed using the standard protocol during bolus administration of intravenous contrast. Multiplanar CT image reconstructions and MIPs were obtained to evaluate the vascular  anatomy. Carotid stenosis measurements (when applicable) are obtained utilizing NASCET criteria, using the distal internal carotid diameter as the denominator. CONTRAST:  144mL OMNIPAQUE IOHEXOL 350 MG/ML SOLN COMPARISON:  Same day noncontrast head CT and CT cerebral perfusion. 09/08/2019 CT neck. FINDINGS: CTA NECK FINDINGS Aortic arch: Standard branching. Imaged portion shows no evidence of aneurysm or dissection. No significant stenosis of the major arch vessel origins. Calcified atheromatous plaque. Right carotid system: No evidence of dissection, stenosis (50% or greater) or occlusion. Calcified atheromatous plaque at the bifurcation. Patent right stent spanning the distal common/proximal internal carotid arteries. Left carotid system: The proximal common left carotid artery demonstrates complete occlusion shortly after its takeoff to the level of the bifurcation where there is extensive calcified and noncalcified atheromatous plaque. There is a distal reconstitution of the left internal carotid and external carotid arteries. The left internal carotid artery demonstrates comparatively diminished opacification. Vertebral arteries: Dominant right vertebral artery. Diminutive left vertebral artery with V4 segment atherosclerotic calcifications. No definite focal high-grade narrowing however diffuse vessel wall irregularity likely reflects atheromatous disease. No evidence of dissection, stenosis (50% or greater) or occlusion. Skeleton: Multilevel spondylosis with reversal of cervical lordosis. Grade 1 C2-3 and C7-T1 anterolisthesis. Grade 1 C5-6 and C6-7 retrolisthesis. No acute osseous abnormality. Other neck: No adenopathy.  No soft tissue mass. Upper chest: Emphysema.  Biapical atelectasis. Review of the MIP images confirms the above findings CTA HEAD FINDINGS Anterior circulation: Asymmetrically diminished opacification of the left internal carotid artery. Bilateral carotid siphon atherosclerotic  calcifications, right greater than left. Distal left ICA cervical segment and petrous segment atherosclerotic calcifications. Diminutive left M1 segment. No aneurysm or vascular malformation. Posterior circulation: Dominant right vertebral artery. Dominant right PCA. Fetal origin of the left PCA. Mild-to-moderate irregularity involving the left P2 segment likely secondary to atheromatous disease. No aneurysm, or vascular malformation. Asymmetrically diminished opacification of the left parietooccipital cortical branches. Venous sinuses: No evidence of thrombosis. Anatomic variants: Bilateral PCOM hypoplasia. Review of the MIP images confirms the above findings IMPRESSION: 1. 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 Distal reconstitution of the left internal and external carotid arteries. 2. Asymmetrically diminished opacification of the left internal carotid artery to the terminus. 3. Mild to moderate narrowing and irregularity involving the left P2 segment, likely secondary to atheromatous disease. 4. Asymmetrically diminished opacification of  left parietooccipital cortical branches. 5. Patent right common/proximal internal carotid artery stent. 6. Diminutive left vertebral artery with V4 segment atherosclerotic calcifications. These results were called by telephone at the time of interpretation on 08/02/2020 at 2:46 pm to provider Dr. Erlinda Hong, Who verbally acknowledged these results. Electronically Signed   By: Primitivo Gauze M.D.   On: 08/02/2020 14:58   MR BRAIN WO CONTRAST  Result Date: 08/02/2020 CLINICAL DATA:  Stroke, follow-up EXAM: MRI HEAD WITHOUT CONTRAST TECHNIQUE: Multiplanar, multiecho pulse sequences of the brain and surrounding structures were obtained without intravenous contrast. COMPARISON:  Correlation made with prior CT imaging FINDINGS: Brain: There is reduced diffusion in the left occipital lobe extending into the  temporal and parietal lobes. There is also patchy reduced diffusion in the left frontal lobe. A small focus is present in the posterior left thalamus. No significant mass effect. There is minimal susceptibility hypointensity primarily in the left parietooccipital lobes likely reflecting petechial hemorrhage. Chronic infarct centered within the right parietal lobe. Additional chronic infarct of the left basal ganglia and adjacent white matter. Superimposed patchy and confluent areas of T2 hyperintensity in the supratentorial and pontine white matter are nonspecific but probably reflect moderate chronic microvascular ischemic changes. Prominence of the ventricles and sulci reflects generalized parenchymal volume loss. Vascular: Major vessel flow voids at the skull base are preserved. Skull and upper cervical spine: Normal marrow signal is preserved. Sinuses/Orbits: Paranasal sinuses are aerated. Orbits are unremarkable. Other: Sella is unremarkable.  Mastoid air cells are clear. IMPRESSION: Acute infarcts involving left posterior greater than anterior circulations, noting presence of a fetal left PCA on the prior CTA. No significant mass effect. There is minor petechial hemorrhage. Moderate chronic microvascular ischemic changes.  Chronic infarcts. Electronically Signed   By: Macy Mis M.D.   On: 08/02/2020 16:52   CT CEREBRAL PERFUSION W CONTRAST  Result Date: 08/02/2020 CLINICAL DATA:  Code stroke EXAM: CT PERFUSION BRAIN TECHNIQUE: Multiphase CT imaging of the brain was performed following IV bolus contrast injection. Subsequent parametric perfusion maps were calculated using RAPID software. CONTRAST:  161mL OMNIPAQUE IOHEXOL 350 MG/ML SOLN COMPARISON:  Same day noncontrast head CT.  09/07/2019 head CT. FINDINGS: CT Brain Perfusion Findings: CBF (<30%) Volume: 5mL Perfusion (Tmax>6.0s) volume: 65mL Mismatch Volume: 44mL ASPECTS on noncontrast CT Head: 10 at 1:50 p.m. Today. Infarct Core: 33 mL Infarction  Location:Left parietooccipital region. IMPRESSION: 33 mL core infarct involving left parietooccipital region with surrounding ischemic penumbra of 45 mL. Electronically Signed   By: Primitivo Gauze M.D.   On: 08/02/2020 13:58   CT HEAD CODE STROKE WO CONTRAST  Result Date: 08/02/2020 CLINICAL DATA:  Code stroke.  Neuro deficit EXAM: CT HEAD WITHOUT CONTRAST TECHNIQUE: Contiguous axial images were obtained from the base of the skull through the vertex without intravenous contrast. COMPARISON:  09/07/2019 head CT. FINDINGS: Brain: Ill-defined hypodense region involving the left parietooccipital region with blurring of the gray-white interface is concerning for acute infarct (5:55). Sequela of remote right MCA territory and left basal ganglia insults. Background scattered and confluent supratentorial white matter hypodensities are nonspecific however commonly associated with chronic microvascular ischemic changes. No mass lesion. No midline shift, ventriculomegaly or extra-axial fluid collection. Vascular: No hyperdense vessel. Bilateral skull base atherosclerotic calcifications. Skull: Negative for fracture or focal lesion. Sinuses/Orbits: Normal orbits. Clear paranasal sinuses. No mastoid effusion. Other: None. ASPECTS [facility] Stroke Program Early CT Score) - Ganglionic level infarction (caudate, lentiform nuclei, internal capsule, insula, M1-M3 cortex): 7 - Supraganglionic infarction (M4-M6 cortex):  3 Total score (0-10 with 10 being normal): 10 IMPRESSION: 1. Acute left parietooccipital infarct.  ASPECTS is 10. 2. Sequela of remote right MCA territory and left basal ganglia insults. 3. Chronic microvascular ischemic changes. Code stroke imaging results were communicated on 08/02/2020 at 1:51 pm to provider Dr. Curly Shores Via [employer] secure text paging. Electronically Signed   By: Primitivo Gauze M.D.   On: 08/02/2020 13:53    Labs:  CBC: Recent Labs    09/12/19 1108 09/12/19 1108 09/24/19 0623  09/25/19 0500 08/02/20 1320 08/02/20 1325  WBC 8.6  --  6.9 4.4 8.1  --   HGB 17.8*   < > 16.9 12.5* 11.0* 12.6*  HCT 49.3   < > 49.9 34.9* 39.5 37.0*  PLT 162  --  173 126* 291  --    < > = values in this interval not displayed.    COAGS: Recent Labs    09/12/19 1108 09/24/19 0623 08/02/20 1320  INR 1.1 1.1 1.1  APTT  --   --  29    BMP: Recent Labs    09/12/19 1108 09/12/19 1108 09/24/19 0623 09/25/19 0500 08/02/20 1320 08/02/20 1325  NA 137   < > 137 139 138 140  K 3.7   < > 3.6 3.9 4.0 4.0  CL 103   < > 108 109 103 103  CO2 26  --  19* 22 25  --   GLUCOSE 117*   < > 134* 111* 131* 131*  BUN 15   < > 7* 9 10 10   CALCIUM 8.4*  --  8.4* 8.2* 8.8*  --   CREATININE 0.81   < > 0.62 0.69 0.64 0.50*  GFRNONAA >60  --  >60 >60 >60  --   GFRAA >60  --  >60 >60 >60  --    < > = values in this interval not displayed.    LIVER FUNCTION TESTS: Recent Labs    09/07/19 0140 09/07/19 0747 09/08/19 0429 08/02/20 1320  BILITOT 1.0 1.1 1.6* 1.3*  AST 56* 52* 41 28  ALT 64* 63* 52* 29  ALKPHOS 62 49 49 68  PROT 7.8 8.0 7.6 7.3  ALBUMIN 4.1 4.0 3.8 3.4*    TUMOR MARKERS: No results for input(s): AFPTM, CEA, CA199, CHROMGRNA in the last 8760 hours.  Assessment and Plan:  CVA Rt sided arm weakness Now On ASA/Plavix Scheduled for cerebral arteriogram Risks and benefits of cerebral angiogram with intervention were discussed with the patient's son [name] via phone including, but not limited to bleeding, infection, vascular injury, contrast induced renal failure, stroke or even death.  This interventional procedure involves the use of X-rays and because of the nature of the planned procedure, it is possible that we will have prolonged use of X-ray fluoroscopy.  Potential radiation risks to you include (but are not limited to) the following: - A slightly elevated risk for cancer  several years later in life. This risk is typically less than 0.5% percent. This risk is  low in comparison to the normal incidence of human cancer, which is 33% for women and 50% for men [place] to [place]. - Radiation induced injury can include skin redness, resembling a rash, tissue breakdown / ulcers and hair loss (which can be temporary or permanent).   The likelihood of either of these occurring depends on the difficulty of the procedure and whether you are sensitive to radiation due to previous procedures, disease, or genetic conditions.  IF your procedure requires a prolonged use of radiation, you will be notified and given written instructions for further action.  It is your responsibility to monitor the irradiated area for the 2 weeks following the procedure and to notify your physician if you are concerned that you have suffered a radiation induced injury.    All questions were answered, son [name] is agreeable to proceed.  Consent signed and in chart.  Thank you for this interesting consult.  I greatly enjoyed meeting AUSTINE WIEDEMAN and look forward to participating in their care.  A copy of this report was sent to the requesting provider on this date.  Electronically Signed: Lavonia Drafts, PA-C 08/03/2020, 7:22 AM   I spent a total of 20 Minutes    in face to face in clinical consultation, greater than 50% of which was counseling/coordinating care for cerebral arteriogram

## 2020-08-03 NOTE — Progress Notes (Signed)
Attempted carotid artery duplex, however patient is going to IR. Will attempt again tomorrow as schedule permits.  08/03/2020 2:07 PM Kelby Aline., MHA, RVT, RDCS, RDMS

## 2020-08-04 ENCOUNTER — Inpatient Hospital Stay (HOSPITAL_COMMUNITY): Payer: Medicare Other

## 2020-08-04 DIAGNOSIS — Z9981 Dependence on supplemental oxygen: Secondary | ICD-10-CM

## 2020-08-04 DIAGNOSIS — Z8673 Personal history of transient ischemic attack (TIA), and cerebral infarction without residual deficits: Secondary | ICD-10-CM

## 2020-08-04 DIAGNOSIS — I639 Cerebral infarction, unspecified: Secondary | ICD-10-CM

## 2020-08-04 DIAGNOSIS — F1910 Other psychoactive substance abuse, uncomplicated: Secondary | ICD-10-CM

## 2020-08-04 DIAGNOSIS — I6389 Other cerebral infarction: Secondary | ICD-10-CM

## 2020-08-04 DIAGNOSIS — I10 Essential (primary) hypertension: Secondary | ICD-10-CM

## 2020-08-04 DIAGNOSIS — I63132 Cerebral infarction due to embolism of left carotid artery: Secondary | ICD-10-CM

## 2020-08-04 DIAGNOSIS — I951 Orthostatic hypotension: Secondary | ICD-10-CM

## 2020-08-04 DIAGNOSIS — I63232 Cerebral infarction due to unspecified occlusion or stenosis of left carotid arteries: Secondary | ICD-10-CM

## 2020-08-04 LAB — BASIC METABOLIC PANEL
Anion gap: 9 (ref 5–15)
BUN: 5 mg/dL — ABNORMAL LOW (ref 8–23)
CO2: 23 mmol/L (ref 22–32)
Calcium: 8.4 mg/dL — ABNORMAL LOW (ref 8.9–10.3)
Chloride: 102 mmol/L (ref 98–111)
Creatinine, Ser: 0.58 mg/dL — ABNORMAL LOW (ref 0.61–1.24)
GFR calc Af Amer: >60 mL/min (ref >60)
GFR calc non Af Amer: >60 mL/min (ref >60)
Glucose, Bld: 107 mg/dL — ABNORMAL HIGH (ref 70–99)
Potassium: 3.7 mmol/L (ref 3.5–5.1)
Sodium: 134 mmol/L — ABNORMAL LOW (ref 135–145)

## 2020-08-04 LAB — ECHOCARDIOGRAM COMPLETE
Area-P 1/2: 3.72 cm2
Height: 66 in
S' Lateral: 2.9 cm
Weight: 2720 oz

## 2020-08-04 MED ORDER — PERFLUTREN LIPID MICROSPHERE
1.0000 mL | INTRAVENOUS | Status: DC | PRN
  Administered 2020-08-04: 2 mL via INTRAVENOUS
  Filled 2020-08-04: qty 10

## 2020-08-04 MED ORDER — ATENOLOL 25 MG PO TABS: 50.0000 mg | ORAL_TABLET | Freq: Every day | ORAL | Status: DC

## 2020-08-04 MED ORDER — CARVEDILOL 3.125 MG PO TABS
3.1250 mg | ORAL_TABLET | Freq: Two times a day (BID) | ORAL | Status: DC
  Administered 2020-08-04 – 2020-08-05 (×2): 3.125 mg via ORAL
  Filled 2020-08-04 (×2): qty 1

## 2020-08-04 NOTE — Progress Notes (Signed)
STROKE TEAM PROGRESS NOTE   SUBJECTIVE (INTERVAL HISTORY) No family at bedside.  2D echo tech at bedside performing test.  Patient neurological stable, awake alert, right arm strength continue to improve, but still has weakness more at right hand and fingers.  Pending PT/OT and orthostatic vital.  OBJECTIVE Temp:  [97.5 F (36.4 C)-98.7 F (37.1 C)] 98.4 F (36.9 C) (08/11 1616) Pulse Rate:  [80-97] 85 (08/11 1616) Cardiac Rhythm: Normal sinus rhythm (08/11 0805) Resp:  [18-22] 20 (08/11 1616) BP: (136-182)/(81-98) 165/86 (08/11 1616) SpO2:  [94 %-97 %] 95 % (08/11 1616)  Recent Labs  Lab 08/02/20 1319  GLUCAP 126*   Recent Labs  Lab 08/02/20 1320 08/02/20 1325 08/03/20 0624 08/04/20 0818  NA 138 140  --  134*  K 4.0 4.0  --  3.7  CL 103 103  --  102  CO2 25  --   --  23  GLUCOSE 131* 131*  --  107*  BUN 10 10  --  5*  CREATININE 0.64 0.50*  --  0.58*  CALCIUM 8.8*  --   --  8.4*  MG  --   --  1.8  --    Recent Labs  Lab 08/02/20 1320  AST 28  ALT 29  ALKPHOS 68  BILITOT 1.3*  PROT 7.3  ALBUMIN 3.4*   Recent Labs  Lab 08/02/20 1320 08/02/20 1325  WBC 8.1  --   NEUTROABS 6.3  --   HGB 11.0* 12.6*  HCT 39.5 37.0*  MCV 73.3*  --   PLT 291  --    No results for input(s): CKTOTAL, CKMB, CKMBINDEX, TROPONINI in the last 168 hours. Recent Labs    08/02/20 1320  LABPROT 14.0  INR 1.1   Recent Labs    08/02/20 2015  COLORURINE YELLOW  LABSPEC 1.039*  PHURINE 6.0  GLUCOSEU NEGATIVE  HGBUR NEGATIVE  BILIRUBINUR NEGATIVE  KETONESUR NEGATIVE  PROTEINUR NEGATIVE  NITRITE NEGATIVE  LEUKOCYTESUR NEGATIVE       Component Value Date/Time   CHOL 102 08/03/2020 0624   TRIG 98 08/03/2020 0624   HDL 35 (L) 08/03/2020 0624   CHOLHDL 2.9 08/03/2020 0624   VLDL 20 08/03/2020 0624   LDLCALC 47 08/03/2020 0624   Lab Results  Component Value Date   HGBA1C 6.0 (H) 08/03/2020      Component Value Date/Time   LABOPIA NONE DETECTED 08/02/2020 2015    COCAINSCRNUR NONE DETECTED 08/02/2020 2015   LABBENZ NONE DETECTED 08/02/2020 2015   AMPHETMU NONE DETECTED 08/02/2020 2015   THCU NONE DETECTED 08/02/2020 2015   LABBARB NONE DETECTED 08/02/2020 2015    Recent Labs  Lab 08/02/20 2015  ETH <10    I have personally reviewed the radiological images below and agree with the radiology interpretations.  CT Code Stroke CTA Head W/WO contrast  Result Date: 08/02/2020 CLINICAL DATA:  Neuro deficit EXAM: CT ANGIOGRAPHY HEAD AND NECK TECHNIQUE: Multidetector CT imaging of the head and neck was performed using the standard protocol during bolus administration of intravenous contrast. Multiplanar CT image reconstructions and MIPs were obtained to evaluate the vascular anatomy. Carotid stenosis measurements (when applicable) are obtained utilizing NASCET criteria, using the distal internal carotid diameter as the denominator. CONTRAST:  147mL OMNIPAQUE IOHEXOL 350 MG/ML SOLN COMPARISON:  Same day noncontrast head CT and CT cerebral perfusion. 09/08/2019 CT neck. FINDINGS: CTA NECK FINDINGS Aortic arch: Standard branching. Imaged portion shows no evidence of aneurysm or dissection. No significant stenosis of the major  arch vessel origins. Calcified atheromatous plaque. Right carotid system: No evidence of dissection, stenosis (50% or greater) or occlusion. Calcified atheromatous plaque at the bifurcation. Patent right stent spanning the distal common/proximal internal carotid arteries. Left carotid system: The proximal common left carotid artery demonstrates complete occlusion shortly after its takeoff to the level of the bifurcation where there is extensive calcified and noncalcified atheromatous plaque. There is a distal reconstitution of the left internal carotid and external carotid arteries. The left internal carotid artery demonstrates comparatively diminished opacification. Vertebral arteries: Dominant right vertebral artery. Diminutive left vertebral artery  with V4 segment atherosclerotic calcifications. No definite focal high-grade narrowing however diffuse vessel wall irregularity likely reflects atheromatous disease. No evidence of dissection, stenosis (50% or greater) or occlusion. Skeleton: Multilevel spondylosis with reversal of cervical lordosis. Grade 1 C2-3 and C7-T1 anterolisthesis. Grade 1 C5-6 and C6-7 retrolisthesis. No acute osseous abnormality. Other neck: No adenopathy.  No soft tissue mass. Upper chest: Emphysema.  Biapical atelectasis. Review of the MIP images confirms the above findings CTA HEAD FINDINGS Anterior circulation: Asymmetrically diminished opacification of the left internal carotid artery. Bilateral carotid siphon atherosclerotic calcifications, right greater than left. Distal left ICA cervical segment and petrous segment atherosclerotic calcifications. Diminutive left M1 segment. No aneurysm or vascular malformation. Posterior circulation: Dominant right vertebral artery. Dominant right PCA. Fetal origin of the left PCA. Mild-to-moderate irregularity involving the left P2 segment likely secondary to atheromatous disease. No aneurysm, or vascular malformation. Asymmetrically diminished opacification of the left parietooccipital cortical branches. Venous sinuses: No evidence of thrombosis. Anatomic variants: Bilateral PCOM hypoplasia. Review of the MIP images confirms the above findings IMPRESSION: 1. 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 Distal reconstitution of the left internal and external carotid arteries. 2. Asymmetrically diminished opacification of the left internal carotid artery to the terminus. 3. Mild to moderate narrowing and irregularity involving the left P2 segment, likely secondary to atheromatous disease. 4. Asymmetrically diminished opacification of left parietooccipital cortical branches. 5. Patent right common/proximal internal carotid artery  stent. 6. Diminutive left vertebral artery with V4 segment atherosclerotic calcifications. These results were called by telephone at the time of interpretation on 08/02/2020 at 2:46 pm to provider Dr. Erlinda Hong, Who verbally acknowledged these results. Electronically Signed   By: Primitivo Gauze M.D.   On: 08/02/2020 14:58   CT Code Stroke CTA Neck W/WO contrast  Result Date: 08/02/2020 CLINICAL DATA:  Neuro deficit EXAM: CT ANGIOGRAPHY HEAD AND NECK TECHNIQUE: Multidetector CT imaging of the head and neck was performed using the standard protocol during bolus administration of intravenous contrast. Multiplanar CT image reconstructions and MIPs were obtained to evaluate the vascular anatomy. Carotid stenosis measurements (when applicable) are obtained utilizing NASCET criteria, using the distal internal carotid diameter as the denominator. CONTRAST:  134mL OMNIPAQUE IOHEXOL 350 MG/ML SOLN COMPARISON:  Same day noncontrast head CT and CT cerebral perfusion. 09/08/2019 CT neck. FINDINGS: CTA NECK FINDINGS Aortic arch: Standard branching. Imaged portion shows no evidence of aneurysm or dissection. No significant stenosis of the major arch vessel origins. Calcified atheromatous plaque. Right carotid system: No evidence of dissection, stenosis (50% or greater) or occlusion. Calcified atheromatous plaque at the bifurcation. Patent right stent spanning the distal common/proximal internal carotid arteries. Left carotid system: The proximal common left carotid artery demonstrates complete occlusion shortly after its takeoff to the level of the bifurcation where there is extensive calcified and noncalcified atheromatous plaque. There is a distal reconstitution  of the left internal carotid and external carotid arteries. The left internal carotid artery demonstrates comparatively diminished opacification. Vertebral arteries: Dominant right vertebral artery. Diminutive left vertebral artery with V4 segment atherosclerotic  calcifications. No definite focal high-grade narrowing however diffuse vessel wall irregularity likely reflects atheromatous disease. No evidence of dissection, stenosis (50% or greater) or occlusion. Skeleton: Multilevel spondylosis with reversal of cervical lordosis. Grade 1 C2-3 and C7-T1 anterolisthesis. Grade 1 C5-6 and C6-7 retrolisthesis. No acute osseous abnormality. Other neck: No adenopathy.  No soft tissue mass. Upper chest: Emphysema.  Biapical atelectasis. Review of the MIP images confirms the above findings CTA HEAD FINDINGS Anterior circulation: Asymmetrically diminished opacification of the left internal carotid artery. Bilateral carotid siphon atherosclerotic calcifications, right greater than left. Distal left ICA cervical segment and petrous segment atherosclerotic calcifications. Diminutive left M1 segment. No aneurysm or vascular malformation. Posterior circulation: Dominant right vertebral artery. Dominant right PCA. Fetal origin of the left PCA. Mild-to-moderate irregularity involving the left P2 segment likely secondary to atheromatous disease. No aneurysm, or vascular malformation. Asymmetrically diminished opacification of the left parietooccipital cortical branches. Venous sinuses: No evidence of thrombosis. Anatomic variants: Bilateral PCOM hypoplasia. Review of the MIP images confirms the above findings IMPRESSION: 1. 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 Distal reconstitution of the left internal and external carotid arteries. 2. Asymmetrically diminished opacification of the left internal carotid artery to the terminus. 3. Mild to moderate narrowing and irregularity involving the left P2 segment, likely secondary to atheromatous disease. 4. Asymmetrically diminished opacification of left parietooccipital cortical branches. 5. Patent right common/proximal internal carotid artery stent. 6. Diminutive left vertebral  artery with V4 segment atherosclerotic calcifications. These results were called by telephone at the time of interpretation on 08/02/2020 at 2:46 pm to provider Dr. Erlinda Hong, Who verbally acknowledged these results. Electronically Signed   By: Primitivo Gauze M.D.   On: 08/02/2020 14:58   MR BRAIN WO CONTRAST  Result Date: 08/02/2020 CLINICAL DATA:  Stroke, follow-up EXAM: MRI HEAD WITHOUT CONTRAST TECHNIQUE: Multiplanar, multiecho pulse sequences of the brain and surrounding structures were obtained without intravenous contrast. COMPARISON:  Correlation made with prior CT imaging FINDINGS: Brain: There is reduced diffusion in the left occipital lobe extending into the temporal and parietal lobes. There is also patchy reduced diffusion in the left frontal lobe. A small focus is present in the posterior left thalamus. No significant mass effect. There is minimal susceptibility hypointensity primarily in the left parietooccipital lobes likely reflecting petechial hemorrhage. Chronic infarct centered within the right parietal lobe. Additional chronic infarct of the left basal ganglia and adjacent white matter. Superimposed patchy and confluent areas of T2 hyperintensity in the supratentorial and pontine white matter are nonspecific but probably reflect moderate chronic microvascular ischemic changes. Prominence of the ventricles and sulci reflects generalized parenchymal volume loss. Vascular: Major vessel flow voids at the skull base are preserved. Skull and upper cervical spine: Normal marrow signal is preserved. Sinuses/Orbits: Paranasal sinuses are aerated. Orbits are unremarkable. Other: Sella is unremarkable.  Mastoid air cells are clear. IMPRESSION: Acute infarcts involving left posterior greater than anterior circulations, noting presence of a fetal left PCA on the prior CTA. No significant mass effect. There is minor petechial hemorrhage. Moderate chronic microvascular ischemic changes.  Chronic infarcts.  Electronically Signed   By: Macy Mis M.D.   On: 08/02/2020 16:52   CT CEREBRAL PERFUSION W CONTRAST  Result Date: 08/02/2020 CLINICAL DATA:  Code stroke EXAM: CT PERFUSION BRAIN TECHNIQUE: Multiphase CT imaging of the brain was performed following IV bolus contrast injection. Subsequent parametric perfusion maps were calculated using RAPID software. CONTRAST:  163mL OMNIPAQUE IOHEXOL 350 MG/ML SOLN COMPARISON:  Same day noncontrast head CT.  09/07/2019 head CT. FINDINGS: CT Brain Perfusion Findings: CBF (<30%) Volume: 28mL Perfusion (Tmax>6.0s) volume: 97mL Mismatch Volume: 75mL ASPECTS on noncontrast CT Head: 10 at 1:50 p.m. Today. Infarct Core: 33 mL Infarction Location:Left parietooccipital region. IMPRESSION: 33 mL core infarct involving left parietooccipital region with surrounding ischemic penumbra of 45 mL. Electronically Signed   By: Primitivo Gauze M.D.   On: 08/02/2020 13:58   ECHOCARDIOGRAM COMPLETE  Result Date: 08/04/2020    ECHOCARDIOGRAM REPORT   Patient Name:   Nathan Hart [facility] Date of Exam: 08/04/2020 Medical Rec #:  [id number]          Height:       66.0 in Accession #:    [id number]         Weight:       170.0 lb Date of Birth:  Jun 29, 1956          BSA:          1.866 m Patient Age:    64 years           BP:           154/84 mmHg Patient Gender: M                  HR:           88 bpm. Exam Location:  Inpatient Procedure: 2D Echo and Intracardiac Opacification Agent Indications:    Stroke 434.91 / I163.9  History:        Patient has prior history of Echocardiogram examinations, most                 recent 09/08/2019. Carotid Disease and COPD; Risk                 Factors:Hypertension, Dyslipidemia and Current Smoker.  Sonographer:    Darlina Sicilian RDCS Referring Phys: [id number] CHRISTOPHER P DANFORD IMPRESSIONS  1. Left ventricular ejection fraction, by estimation, is 55 to 60%. The left ventricle has normal function. The left ventricle demonstrates regional wall motion  abnormalities with possible basal inferior hypokinesis. There is mild left ventricular hypertrophy. Left ventricular diastolic parameters are consistent with Grade I diastolic dysfunction (impaired relaxation).  2. Right ventricular systolic function is normal. The right ventricular size is normal. Tricuspid regurgitation signal is inadequate for assessing PA pressure.  3. The mitral valve is normal in structure. No evidence of mitral valve regurgitation. No evidence of mitral stenosis.  4. The aortic valve is tricuspid. Aortic valve regurgitation is not visualized. No aortic stenosis is present.  5. The inferior vena cava is normal in size with greater than 50% respiratory variability, suggesting right atrial pressure of 3 mmHg.  6. Technically difficult study with poor acoustic windows. FINDINGS  Left Ventricle: Left ventricular ejection fraction, by estimation, is 55 to 60%. The left ventricle has normal function. The left ventricle demonstrates regional wall motion abnormalities. Definity contrast agent was given IV to delineate the left ventricular endocardial borders. The left ventricular internal cavity size was normal in size. There is mild left ventricular hypertrophy. Left ventricular diastolic parameters are consistent with Grade I diastolic dysfunction (impaired relaxation). Right Ventricle: The right ventricular size is normal. No increase in right ventricular wall thickness. Right ventricular systolic  function is normal. Tricuspid regurgitation signal is inadequate for assessing PA pressure. Left Atrium: Left atrial size was normal in size. Right Atrium: Right atrial size was normal in size. Pericardium: There is no evidence of pericardial effusion. Mitral Valve: The mitral valve is normal in structure. There is mild thickening of the mitral valve leaflet(s). There is moderate calcification of the mitral valve leaflet(s). No evidence of mitral valve regurgitation. No evidence of mitral valve stenosis.  Tricuspid Valve: The tricuspid valve is normal in structure. Tricuspid valve regurgitation is not demonstrated. Aortic Valve: The aortic valve is tricuspid. Aortic valve regurgitation is not visualized. No aortic stenosis is present. Pulmonic Valve: The pulmonic valve was normal in structure. Pulmonic valve regurgitation is not visualized. Aorta: The aortic root is normal in size and structure. Venous: The inferior vena cava is normal in size with greater than 50% respiratory variability, suggesting right atrial pressure of 3 mmHg. IAS/Shunts: No atrial level shunt detected by color flow Doppler.  LEFT VENTRICLE PLAX 2D LVIDd:         3.80 cm  Diastology LVIDs:         2.90 cm  LV e' lateral:   6.22 cm/s LV PW:         1.10 cm  LV E/e' lateral: 8.3 LV IVS:        1.40 cm  LV e' medial:    6.57 cm/s LVOT diam:     2.10 cm  LV E/e' medial:  7.9 LV SV:         46 LV SV Index:   25 LVOT Area:     3.46 cm  LEFT ATRIUM             Index LA diam:        2.90 cm 1.55 cm/m LA Vol (A2C):   36.8 ml 19.72 ml/m LA Vol (A4C):   31.5 ml 16.88 ml/m LA Biplane Vol: 36.6 ml 19.61 ml/m  AORTIC VALVE LVOT Vmax:   76.90 cm/s LVOT Vmean:  49.100 cm/s LVOT VTI:    0.133 m  AORTA Ao Root diam: 3.20 cm MITRAL VALVE MV Area (PHT): 3.72 cm    SHUNTS MV Decel Time: 204 msec    Systemic VTI:  0.13 m MV E velocity: 51.90 cm/s  Systemic Diam: 2.10 cm MV A velocity: 86.80 cm/s MV E/A ratio:  0.60 Loralie Champagne MD Electronically signed by Loralie Champagne MD Signature Date/Time: 08/04/2020/4:48:24 PM    Final    CT HEAD CODE STROKE WO CONTRAST  Result Date: 08/02/2020 CLINICAL DATA:  Code stroke.  Neuro deficit EXAM: CT HEAD WITHOUT CONTRAST TECHNIQUE: Contiguous axial images were obtained from the base of the skull through the vertex without intravenous contrast. COMPARISON:  09/07/2019 head CT. FINDINGS: Brain: Ill-defined hypodense region involving the left parietooccipital region with blurring of the gray-white interface is concerning for  acute infarct (5:55). Sequela of remote right MCA territory and left basal ganglia insults. Background scattered and confluent supratentorial white matter hypodensities are nonspecific however commonly associated with chronic microvascular ischemic changes. No mass lesion. No midline shift, ventriculomegaly or extra-axial fluid collection. Vascular: No hyperdense vessel. Bilateral skull base atherosclerotic calcifications. Skull: Negative for fracture or focal lesion. Sinuses/Orbits: Normal orbits. Clear paranasal sinuses. No mastoid effusion. Other: None. ASPECTS [facility] Stroke Program Early CT Score) - Ganglionic level infarction (caudate, lentiform nuclei, internal capsule, insula, M1-M3 cortex): 7 - Supraganglionic infarction (M4-M6 cortex): 3 Total score (0-10 with 10 being normal): 10 IMPRESSION: 1. Acute  left parietooccipital infarct.  ASPECTS is 10. 2. Sequela of remote right MCA territory and left basal ganglia insults. 3. Chronic microvascular ischemic changes. Code stroke imaging results were communicated on 08/02/2020 at 1:51 pm to provider Dr. Curly Shores Via [employer] secure text paging. Electronically Signed   By: Primitivo Gauze M.D.   On: 08/02/2020 13:53    PHYSICAL EXAM  Temp:  [97.5 F (36.4 C)-98.7 F (37.1 C)] 98.4 F (36.9 C) (08/11 1616) Pulse Rate:  [80-97] 85 (08/11 1616) Resp:  [18-22] 20 (08/11 1616) BP: (136-182)/(81-98) 165/86 (08/11 1616) SpO2:  [94 %-97 %] 95 % (08/11 1616)  General - well nourished, well developed, in no apparent distress.    Ophthalmologic - fundi not visualized due to noncooperation.    Cardiovascular - regular rhythm and rate  Mental Status -  Level of arousal and orientation to self, place and son were intact.  However, not orientated to age or time. Language including expression, naming, repetition, comprehension, reading, and writing was assessed and found intact.  Cranial Nerves II - XII - II - Visual field difficult test due to legally  blind bilaterally, but more likely to have right hemianopia III, IV, VI - Extraocular movements intact, no gaze deviation. V - Facial sensation intact bilaterally. VII - left nasolabial fold flattening, likely chronic. VIII - Hearing & vestibular intact bilaterally. X - Palate elevates symmetrically. XI - Chin turning & shoulder shrug intact bilaterally. XII - Tongue protrusion intact.  Motor Strength - The patient's strength was normal left upper extremities, however right upper extremity mild drift 4+/5 with decreased finger grip 3+/5, right hand dexterity difficulty.  Bilateral lower extremity equal strength 5/5.  Motor Tone & Bulk - Muscle tone was assessed at the neck and appendages and was normal.  Bulk was normal and fasciculations were absent.   Reflexes - The patient's reflexes were normal in all extremities and he had no pathological reflexes.  Sensory - Light touch, temperature/pinprick were assessed and were normal.    Coordination - The patient had normal movements in the hands with no ataxia or dysmetria, however slow on the right.  Tremor was absent.  Gait and Station - deferred   ASSESSMENT/PLAN Mr. DEVAUN HERNANDEZ is a 64 y.o. male with history of hypertension, PAD, CVA, COPD, legally blind bilaterally, smoker and alcohol user admitted for right-sided weakness and fall. No tPA given due to outside window, and established stroke.    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    CTA head and neck showed left CCA possible occlusion, and left ICA reconstitution with slow flow.  Right ICA stent patent, bilateral siphon high-grade stenosis.  Left P2 high-grade stenosis.    CTP 33 cc core with 45 penumbra.  MRI  Acute infarcts involving left posterior greater than anterior circulations, noting presence of a fetal left PCA  on the prior CTA  IR left CCA mid to distal occlusion, left MCA and ACA patent, right ICA stent patent.  2D Echo EF 55 to 60%  LDL 47  HgbA1c 6.0  Lovenox for VTE prophylaxis  No antithrombotic prior to admission, now on aspirin 325 mg daily and clopidogrel 75 mg daily DAPT for 3 months and then aspirin alone.  Patient counseled to be compliant with his antithrombotic medications  Ongoing aggressive stroke risk factor management  Therapy recommendations: SNF vs. CIR  Disposition: Pending  Carotid stenosis  08/2019 cerebral angiogram showed right ICA 90% stenosis, left CCA 50% stenosis, left ICA 50% stenosis.  Left fetal PCA.  Left VA occluded with distal reconstitution  09/2019 status post right carotid stenting with Dr. Estanislado Pandy  This admission CTA head neck left CCA occlusion, right ICA stent patent  This admission cerebral angiogram left CCA mid to distal occlusion, right ICA stent patent  No endovascular procedure indicated, continue medical management.  Long-term BP goal 130-150  Orthostatic hypotension Hypertension . Stable on the high end lying BP 150-170s . 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 After hours, contact General  Neurology

## 2020-08-04 NOTE — TOC Initial Note (Signed)
Transition of Care [facility]) - Initial/Assessment Note    Patient Details  Name: Nathan Hart MRN: [id number] Date of Birth: 07/05/1956  Transition of Care [facility]) CM/SW Contact:    Pollie Friar, RN Phone Number: 08/04/2020, 11:39 AM  Clinical Narrative:                 CM met with the patient to discuss d/c planning. Pt is agreeable SNF rehab. He also asked that CM reach out to his son. CM has left a voicemail for [name] (son).  Pt faxed out in [facility] area per his request.  TOC following.  Expected Discharge Plan: Skilled Nursing Facility Barriers to Discharge: Continued Medical Work up   Patient Goals and CMS Choice   CMS Medicare.gov Compare Post Acute Care list provided to:: Patient Choice offered to / list presented to : Patient, Adult Children  Expected Discharge Plan and Services Expected Discharge Plan: [place] In-house Referral: Clinical Social Work Discharge Planning Services: CM Consult Post Acute Care Choice: [place] arrangements for the past 2 months: [place]                                      Prior Living Arrangements/Services Living arrangements for the past 2 months: Single Family Home Lives with:: Self Patient language and need for interpreter reviewed:: Yes Do you feel safe going back to the place where you live?: Yes      Need for Family Participation in Patient Care: Yes (Comment) Care giver support system in place?: No (comment)   Criminal Activity/Legal Involvement Pertinent to Current Situation/Hospitalization: No - Comment as needed  Activities of Daily Living      Permission Sought/Granted                  Emotional Assessment Appearance:: Appears stated age Attitude/Demeanor/Rapport: Engaged Affect (typically observed): Accepting Orientation: : Oriented to Self, Oriented to Place, Oriented to Situation   Psych Involvement: No (comment)  Admission  diagnosis:  Stroke [facility]) [I63.9] Stroke (cerebrum) (HCC) [I63.9] Acute CVA (cerebrovascular accident) [facility]) [I63.9] Patient Active Problem List   Diagnosis Date Noted  . Cerebral embolism with cerebral infarction 08/02/2020  . Stroke (cerebrum) ([place]) 08/02/2020  . Stroke ([place]) 08/02/2020  . Carotid stenosis, symptomatic w/o infarct 09/24/2019  . PAD (peripheral artery disease) /Bil ICA Stenosis 09/08/2019  . Delirium tremens ([place]) 09/08/2019  . COPD (chronic obstructive pulmonary disease) ([place]) 09/07/2019  . Chest wall pain 09/07/2019  . Syncope 09/07/2019  . Community acquired pneumonia 09/07/2019  . Abnormal liver function/Alcoholic Hepatitis 08/67/6195   PCP:  Patient, No Pcp Per Pharmacy:   [place] [place] [address] Phone: [phone] Fax: [phone]  [id number] [address]Phone: [phone]: [phone]    Social Determinants of Health (SDOH) Interventions    Readmission Risk Interventions No flowsheet data found.

## 2020-08-04 NOTE — Consult Note (Signed)
Physical Medicine and Rehabilitation Consult Reason for Consult: Right side numbness Referring Physician: [facility]   HPI: Nathan Hart is a 64 y.o. right-handed male with history of hypertension, CVA, right carotid stenting September 2020 COPD with home oxygen, legally blind bilaterally, smoker and alcohol use as well as questionable medical compliance. History taken from chart review and patient.  Patient lives with son and family.  He presented on 08/02/2020 with right side numbness and hemiparesis.  Cranial CT scan showed acute left parietal occipital sequela of remote right MCA territory left basal ganglia insult.  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  Right ICA stent was patent.  Patient did not receive TPA.  MRI acute infarct left posterior greater than anterior circulations noted presence of a fetal left PCA on the prior CTA no significant mass-effect.  There was minor petechial hemorrhage.  Admission chemistries glucose 131, hemoglobin 11, alcohol negative, urine drug screen negative, urinalysis negative nitrite.  Currently maintained on aspirin and Plavix for CVA prophylaxis.  Subcutaneous Lovenox for DVT prophylaxis.  Tolerating a regular diet.  Therapy evaluations completed with recommendations of physical medicine rehab consult.    Review of Systems  Constitutional: Positive for malaise/fatigue. Negative for chills and fever.  HENT: Negative for hearing loss.   Eyes: Negative for blurred vision and double vision.       Patient is legally blind  Respiratory: Positive for shortness of breath.   Cardiovascular: Negative for chest pain, palpitations and leg swelling.  Gastrointestinal: Positive for constipation. Negative for heartburn, nausea and vomiting.  Genitourinary: Negative for dysuria, flank pain and hematuria.  Musculoskeletal: Positive for myalgias.  Skin: Negative for rash.    Neurological: Positive for sensory change, focal weakness and weakness.  All other systems reviewed and are negative.  Past Medical History:  Diagnosis Date  . Abnormal peripheral vision of left eye   . Blind right eye   . COPD (chronic obstructive pulmonary disease) (HCC)     home o2 hs and prn  . Dyspnea    with activity  . Empyema lung ([place])   . ETOH abuse   . HTN (hypertension)   . Peripheral vascular disease ([place])   . Pneumonia 2020  . Stroke [facility])    Past Surgical History:  Procedure Laterality Date  . BACK SURGERY     disc lumar x 2   . CLAVICLE SURGERY Right   . IR ANGIO INTRA EXTRACRAN SEL COM CAROTID INNOMINATE BILAT MOD SED  09/12/2019  . IR ANGIO VERTEBRAL SEL SUBCLAVIAN INNOMINATE UNI L MOD SED  09/12/2019  . IR ANGIO VERTEBRAL SEL VERTEBRAL UNI R MOD SED  09/12/2019  . IR INTRAVSC STENT CERV CAROTID W/EMB-PROT MOD SED INCL ANGIO  09/24/2019  . IR US GUIDE VASC ACCESS RIGHT  09/12/2019  . KNEE SURGERY Right     x 3 ACL  . RADIOLOGY WITH ANESTHESIA N/A 09/24/2019   Procedure: IR WITH ANESTHESIA/STENTING;  Surgeon: Luanne Bras, MD;  Location: [facility];  Service: Radiology;  Laterality: N/A;  . [place], [place]   Family History  Problem Relation Age of Onset  . Cancer Mother    Social History:  reports that he has been smoking cigarettes. He has a 13.50 pack-year smoking history. He has never used smokeless tobacco. He reports current alcohol use of about 20.0 standard drinks of alcohol  per week. He reports that he does not use drugs. Allergies: No Known Allergies Medications Prior to Admission  Medication Sig Dispense Refill  . acetaminophen (TYLENOL) 325 MG tablet Take 650 mg by mouth every 6 (six) hours as needed for moderate pain or headache.    . albuterol (PROVENTIL) (2.5 MG/3ML) 0.083% nebulizer solution Take 3 mLs (2.5 mg total) by nebulization every 6 (six) hours as needed for wheezing or shortness of breath. For breathing 75 mL 12  .  atenolol (TENORMIN) 50 MG tablet Take 1 tablet (50 mg total) by mouth daily. For BP 30 tablet 2  . atorvastatin (LIPITOR) 40 MG tablet Take 1 tablet (40 mg total) by mouth every evening. For Cholesterol 30 tablet 5  . diphenhydrAMINE HCl, Sleep, (ZZZQUIL) 25 MG CAPS Take 25 mg by mouth at bedtime as needed (sleep).    . folic acid (FOLVITE) 1 MG tablet Take 1 tablet (1 mg total) by mouth daily. 30 tablet 5    Home: Home Living Family/patient expects to be discharged to:: Private residence Living Arrangements: Children (PT cannot remember home details) Available Help at Discharge: Family Additional Comments: unable to remember his home  Functional History: Prior Function Level of Independence:  (pt cannot remember) Functional Status:  Mobility: Bed Mobility Overal bed mobility: Modified Independent General bed mobility comments: mod I to sit up in bed Transfers Overall transfer level: Needs assistance Equipment used: Rolling walker (2 wheeled), None Transfers: Sit to/from Stand, [employer] Transfers Sit to Stand: Min assist, Min guard Stand pivot transfers: Min guard General transfer comment: Pt stood from EOB with RW for orthostatic vitals. Min A given to power up. Pt stood a second time from EOB with min guard for safety and pivoted to recliner chair w/o AD Ambulation/Gait General Gait Details: defered    ADL:    Cognition: Cognition Overall Cognitive Status: Difficult to assess Orientation Level: Oriented to person, Oriented to place Cognition Arousal/Alertness: Awake/alert Behavior During Therapy: Flat affect Overall Cognitive Status: Difficult to assess General Comments: Pt giving short one word answers to questions. He was disoriantated to situation. Son present and states that pt is more talkitive with family but closes up around others at baseline. Follows commands with increased time Difficult to assess due to: Impaired communication  Blood pressure (!) 154/84,  pulse 88, temperature 98.3 F (36.8 C), temperature source Oral, resp. rate 20, height 5\' 6"  (1.676 m), weight 77.1 kg, SpO2 95 %. Physical Exam Vitals reviewed.  Constitutional:      General: He is not in acute distress.    Appearance: He is not ill-appearing.  HENT:     Head: Normocephalic and atraumatic.     Right Ear: External ear normal.     Left Ear: External ear normal.     Nose: Nose normal.  Eyes:     General:        Right eye: No discharge.        Left eye: No discharge.     Extraocular Movements: Extraocular movements intact.  Cardiovascular:     Rate and Rhythm: Normal rate.  Pulmonary:     Effort: Pulmonary effort is normal. No respiratory distress.     Breath sounds: No stridor.  Abdominal:     General: Abdomen is flat. There is no distension.  Musculoskeletal:     Cervical back: Normal range of motion and neck supple.     Comments: No edema or tenderness in extremities  Skin:    General: Skin  is warm and dry.  Neurological:     Mental Status: He is alert.     Comments: Alert and oriented to self and place but needed multiple cues for his age and time.   He does follow simple commands.   Motor: LUE: 4/5 proximal distal RLE: 5/5 proximal distal LLE/LUE: 5/5 proximal to distal  Psychiatric:        Mood and Affect: Mood normal.        Behavior: Behavior normal.     Results for orders placed or performed during the hospital encounter of 08/02/20 (from the past 24 hour(s))  Basic metabolic panel     Status: Abnormal   Collection Time: 08/04/20  8:18 AM  Result Value Ref Range   Sodium 134 (L) 135 - 145 mmol/L   Potassium 3.7 3.5 - 5.1 mmol/L   Chloride 102 98 - 111 mmol/L   CO2 23 22 - 32 mmol/L   Glucose, Bld 107 (H) 70 - 99 mg/dL   BUN 5 (L) 8 - 23 mg/dL   Creatinine, Ser 0.58 (L) 0.61 - 1.24 mg/dL   Calcium 8.4 (L) 8.9 - 10.3 mg/dL   GFR calc non Af Amer >60 >60 mL/min   GFR calc Af Amer >60 >60 mL/min   Anion gap 9 5 - 15   MR BRAIN WO  CONTRAST  Result Date: 08/02/2020 CLINICAL DATA:  Stroke, follow-up EXAM: MRI HEAD WITHOUT CONTRAST TECHNIQUE: Multiplanar, multiecho pulse sequences of the brain and surrounding structures were obtained without intravenous contrast. COMPARISON:  Correlation made with prior CT imaging FINDINGS: Brain: There is reduced diffusion in the left occipital lobe extending into the temporal and parietal lobes. There is also patchy reduced diffusion in the left frontal lobe. A small focus is present in the posterior left thalamus. No significant mass effect. There is minimal susceptibility hypointensity primarily in the left parietooccipital lobes likely reflecting petechial hemorrhage. Chronic infarct centered within the right parietal lobe. Additional chronic infarct of the left basal ganglia and adjacent white matter. Superimposed patchy and confluent areas of T2 hyperintensity in the supratentorial and pontine white matter are nonspecific but probably reflect moderate chronic microvascular ischemic changes. Prominence of the ventricles and sulci reflects generalized parenchymal volume loss. Vascular: Major vessel flow voids at the skull base are preserved. Skull and upper cervical spine: Normal marrow signal is preserved. Sinuses/Orbits: Paranasal sinuses are aerated. Orbits are unremarkable. Other: Sella is unremarkable.  Mastoid air cells are clear. IMPRESSION: Acute infarcts involving left posterior greater than anterior circulations, noting presence of a fetal left PCA on the prior CTA. No significant mass effect. There is minor petechial hemorrhage. Moderate chronic microvascular ischemic changes.  Chronic infarcts. Electronically Signed   By: Macy Mis M.D.   On: 08/02/2020 16:52    Assessment/Plan: Diagnosis: Acute infarct left posterior greater than anterior circulations  Stroke: Continue secondary stroke prophylaxis and Risk Factor Modification listed below:   Antiplatelet therapy:   Blood Pressure  Management:  Continue current medication with prn's with permisive HTN per primary team Statin Agent:   Diabetes management:   Tobacco abuse:   PT/OT for mobility, ADL training  Labs independently reviewed.  Records reviewed and summated above.  1. Does the need for close, 24 hr/day medical supervision in concert with the patient's rehab needs make it unreasonable for this patient to be served in a less intensive setting? Yes  2. Co-Morbidities requiring supervision/potential complications: HTN (monitor and provide prns in accordance with increased physical exertion and  pain), CVA, right carotid stenting September 2020 COPD with home oxygen, legally blind bilaterally, smoker and alcohol (counsel). 3. Due to bladder management, bowel management, safety, skin/wound care, disease management, medication administration and patient education, does the patient require 24 hr/day rehab nursing? Yes 4. Does the patient require coordinated care of a physician, rehab nurse, therapy disciplines of PT/OT/SLP to address physical and functional deficits in the context of the above medical diagnosis(es)? Yes Addressing deficits in the following areas: balance, endurance, locomotion, strength, transferring, bathing, dressing, toileting, cognition, language and psychosocial support 5. Can the patient actively participate in an intensive therapy program of at least 3 hrs of therapy per day at least 5 days per week? Yes 6. The potential for patient to make measurable gains while on inpatient rehab is good 7. Anticipated functional outcomes upon discharge from inpatient rehab are modified independent and supervision  with PT, modified independent and supervision with OT, modified independent and supervision with SLP. 8. Estimated rehab length of stay to reach the above functional goals is: 5-8 days. 9. Anticipated discharge destination: Home 10. Overall Rehab/Functional Prognosis: good  RECOMMENDATIONS: This patient's  condition is appropriate for continued rehabilitative care in the following setting: CIR if caregiver support available upon discharge. Patient has agreed to participate in recommended program. Yes Note that insurance prior authorization may be required for reimbursement for recommended care.  Comment: Rehab Admissions Coordinator to follow up.  I have personally performed a face to face diagnostic evaluation, including, but not limited to relevant history and physical exam findings, of this patient and developed relevant assessment and plan.  Additionally, I have reviewed and concur with the physician assistant's documentation above.   Delice Lesch, MD, ABPMR Lavon Paganini Angiulli, PA-C 08/04/2020

## 2020-08-04 NOTE — Evaluation (Signed)
Occupational Therapy Evaluation Patient Details Name: Nathan Hart MRN: [id number] DOB: 11-Mar-1956 Today's Date: 08/04/2020    History of Present Illness Pt is a 64 y/o male with hx of HTN, PAD, CVA, COPD, legally blind bilaterally presenting with R sided weakness and fall.  Found acute left parieto-occipiltal infarct.    Clinical Impression   Patient admitted for above and limited by problem list below, including R UE weakness and decreased coordination, impaired balance, decreased activity tolerance and impaired cognition.  Per chart review, pt with history of cognitive deficits.  Patient remains oriented to self and place only, follows commands with increased time and multimodal cueing.  He is a poor historian and is unable to provide PLOF or home setup, per chart review he has 24/7 support at discharge from family.  He requires min-mod assist for ADLs and min assist for basic transfers.  Based on performance today, patient will benefit from further OT services while admitted and after dc at CIR level to optimize independence and return to PLOF with ADLs and mobility.  Will follow acutely.     Follow Up Recommendations  CIR;Supervision/Assistance - 24 hour    Equipment Recommendations  3 in 1 bedside commode    Recommendations for Other Services       Precautions / Restrictions Precautions Precautions: Fall Restrictions Weight Bearing Restrictions: No Other Position/Activity Restrictions: BLE strength WFL      Mobility Bed Mobility Overal bed mobility: Modified Independent             General bed mobility comments: returned to supine without assist   Transfers Overall transfer level: Needs assistance Equipment used: 1 person hand held assist Transfers: Sit to/from Stand;Stand Pivot Transfers Sit to Stand: Min assist Stand pivot transfers: Min assist       General transfer comment: min assist from recliner to power up and steady, verbal cueing for pivot to EOB  due to visual deficits     Balance Overall balance assessment: Needs assistance Sitting-balance support: Feet supported Sitting balance-Leahy Scale: Fair Sitting balance - Comments: sat EOB for ~5 min during orthostatic vitals   Standing balance support: No upper extremity supported;During functional activity Standing balance-Leahy Scale: Fair Standing balance comment: relies on 1 UE support during transfer                           ADL either performed or assessed with clinical judgement   ADL Overall ADL's : Needs assistance/impaired     Grooming: Moderate assistance;Sitting   Upper Body Bathing: Moderate assistance;Sitting   Lower Body Bathing: Moderate assistance;Sit to/from stand   Upper Body Dressing : Moderate assistance;Sitting   Lower Body Dressing: Moderate assistance;Sit to/from stand Lower Body Dressing Details (indicate cue type and reason): able to adjust socks but would need assist to don  Toilet Transfer: Minimal assistance;Stand-pivot Toilet Transfer Details (indicate cue type and reason): simulated from reclienr to EOB          Functional mobility during ADLs: Minimal assistance;Cueing for safety;Cueing for sequencing General ADL Comments: pt limited by cognition, R sided weakness, and impaired balance      Vision Baseline Vision/History: Legally blind Patient Visual Report: No change from baseline       Perception     Praxis      Pertinent Vitals/Pain Pain Assessment: No/denies pain     Hand Dominance Right   Extremity/Trunk Assessment Upper Extremity Assessment Upper Extremity Assessment: RUE deficits/detail RUE Deficits /  Details: grossly 3-/5 MMT, unable to use functionally or use as functional support RUE Sensation: WNL RUE Coordination: decreased fine motor;decreased gross motor   Lower Extremity Assessment Lower Extremity Assessment: Defer to PT evaluation   Cervical / Trunk Assessment Cervical / Trunk Assessment:  Normal   Communication Communication Communication: No difficulties   Cognition Arousal/Alertness: Awake/alert Behavior During Therapy: Flat affect Overall Cognitive Status: No family/caregiver present to determine baseline cognitive functioning Area of Impairment: Orientation;Attention;Memory;Following commands;Safety/judgement;Awareness;Problem solving                 Orientation Level: Disoriented to;Time;Situation Current Attention Level: Sustained Memory: Decreased short-term memory Following Commands: Follows one step commands consistently;Follows one step commands with increased time;Follows multi-step commands inconsistently Safety/Judgement: Decreased awareness of safety;Decreased awareness of deficits Awareness: Intellectual Problem Solving: Slow processing;Decreased initiation;Difficulty sequencing;Requires verbal cues;Requires tactile cues General Comments: Pt oriented to self and place only, reoriented to situation and time.  Follows commands with increased time, but poor attention and recall.  Noted baseline cognitive deficits per chart review.    General Comments  Pt son present for session.     Exercises     Shoulder Instructions      Home Living Family/patient expects to be discharged to:: Private residence Living Arrangements: Children Available Help at Discharge: Family Type of Home: House                           Additional Comments: pt reports living with sons but unable to report home setup       Prior Functioning/Environment Level of Independence: Needs assistance        Comments: pt unable to report PLOF, but reports needing some assist         OT Problem List: Decreased strength;Decreased range of motion;Decreased activity tolerance;Impaired balance (sitting and/or standing);Impaired vision/perception;Decreased coordination;Decreased cognition;Decreased safety awareness;Decreased knowledge of use of DME or AE;Decreased  knowledge of precautions;Cardiopulmonary status limiting activity;Impaired sensation;Impaired tone;Impaired UE functional use      OT Treatment/Interventions: Self-care/ADL training;DME and/or AE instruction;Neuromuscular education;Therapeutic activities;Cognitive remediation/compensation;Patient/family education;Balance training;Visual/perceptual remediation/compensation    OT Goals(Current goals can be found in the care plan section) Acute Rehab OT Goals Patient Stated Goal: to get back to bed OT Goal Formulation: With patient Time For Goal Achievement: 08/18/20 Potential to Achieve Goals: Good  OT Frequency: Min 2X/week   Barriers to D/C:            Co-evaluation              AM-PAC OT "6 Clicks" Daily Activity     Outcome Measure Help from another person eating meals?: A Little Help from another person taking care of personal grooming?: A Little Help from another person toileting, which includes using toliet, bedpan, or urinal?: A Lot Help from another person bathing (including washing, rinsing, drying)?: A Lot Help from another person to put on and taking off regular upper body clothing?: A Lot Help from another person to put on and taking off regular lower body clothing?: A Lot 6 Click Score: 14   End of Session Equipment Utilized During Treatment: Gait belt Nurse Communication: Mobility status  Activity Tolerance: Patient limited by fatigue Patient left: in bed;with call bell/phone within reach;with bed alarm set  OT Visit Diagnosis: Other abnormalities of gait and mobility (R26.89);Muscle weakness (generalized) (M62.81);Hemiplegia and hemiparesis;Cognitive communication deficit (R41.841) Symptoms and signs involving cognitive functions: Cerebral infarction Hemiplegia - Right/Left: Right Hemiplegia - dominant/non-dominant: Dominant Hemiplegia - caused  by: Cerebral infarction                Time: 3234-6887 OT Time Calculation (min): 14 min Charges:  OT General  Charges $OT Visit: 1 Visit OT Evaluation $OT Eval Moderate Complexity: 1 Mod  Jolaine Artist, OT Acute Rehabilitation Services Pager [phone] Office [phone]   Delight Stare 08/04/2020, 3:32 PM

## 2020-08-04 NOTE — Evaluation (Signed)
Speech Language Pathology Evaluation Patient Details Name: Nathan Hart MRN: [id number] DOB: 05-31-1956 Today's Date: 08/04/2020 Time: 8502-7741 SLP Time Calculation (min) (ACUTE ONLY): 24 min  Problem List:  Patient Active Problem List   Diagnosis Date Noted  . Acute CVA (cerebrovascular accident) ([place])   . Essential hypertension   . History of CVA (cerebrovascular accident)   . Supplemental oxygen dependent   . Substance abuse ([place])   . Cerebral embolism with cerebral infarction 08/02/2020  . Stroke (cerebrum) ([place]) 08/02/2020  . Stroke ([place]) 08/02/2020  . Carotid stenosis, symptomatic w/o infarct 09/24/2019  . PAD (peripheral artery disease) /Bil ICA Stenosis 09/08/2019  . Delirium tremens ([place]) 09/08/2019  . COPD (chronic obstructive pulmonary disease) ([place]) 09/07/2019  . Chest wall pain 09/07/2019  . Syncope 09/07/2019  . Community acquired pneumonia 09/07/2019  . Abnormal liver function/Alcoholic Hepatitis 28/78/6767   Past Medical History:  Past Medical History:  Diagnosis Date  . Abnormal peripheral vision of left eye   . Blind right eye   . COPD (chronic obstructive pulmonary disease) ([facility])     home o2 hs and prn  . Dyspnea    with activity  . Empyema lung ([place])   . ETOH abuse   . HTN (hypertension)   . Peripheral vascular disease ([place])   . Pneumonia 2020  . Stroke [employer])    Past Surgical History:  Past Surgical History:  Procedure Laterality Date  . BACK SURGERY     disc lumar x 2   . CLAVICLE SURGERY Right   . IR ANGIO INTRA EXTRACRAN SEL COM CAROTID INNOMINATE BILAT MOD SED  09/12/2019  . IR ANGIO VERTEBRAL SEL SUBCLAVIAN INNOMINATE UNI L MOD SED  09/12/2019  . IR ANGIO VERTEBRAL SEL VERTEBRAL UNI R MOD SED  09/12/2019  . IR INTRAVSC STENT CERV CAROTID W/EMB-PROT MOD SED INCL ANGIO  09/24/2019  . IR US GUIDE VASC ACCESS RIGHT  09/12/2019  . KNEE SURGERY Right     x 3 ACL  . RADIOLOGY WITH ANESTHESIA N/A 09/24/2019   Procedure: IR WITH  ANESTHESIA/STENTING;  Surgeon: Luanne Bras, MD;  Location: [facility];  Service: Radiology;  Laterality: N/A;  . Nathan Hart   HPI:  Pt is a 64 y/o male with hx of HTN, PAD, CVA, COPD, chronic EtOH abuse, legally blind bilaterally, presenting with R sided weakness and fall.  Found acute left parieto-occipiltal infarct.   Assessment / Plan / Recommendation Clinical Impression  Mr. Bushway demonstrates severe cognitive impairment in setting of acute L parieto-occipital infarct. He does have a history of CVA with some baseline deficits (unable to verbalize details and son not available during this evaluation to provide history). Suspect worsening of deficits during this hospitalization from new stroke. He denied any cognitive changes or difficulty, but stated, "I live with my son and he and my daughter in law help me out a lot." At this time, pt showed deficits in orientation, attention, memory, calculations, reasoning, problem solving, safety judgment. He has poor insight into deficits. Pt questionably with some aphasia versus reduced thought organization for word finding. He had several hesitations in conversation that appeared to be for finding words. He also has baseline visual impairment with legal blindness bilaterally. At this time, recommend full assist for all cognitive tasks (med mgmt, money mgmt, driving, cooking, safely navigating self care). Son and daughter in law live with the patient and can provide 24 hour support at discharge. Pt would benefit from inpatient  rehab consult for intensive therapy prior to d/c home. Recommend acute ST a minimum of 2x/wk during hospitalization.  [facility] Mental Status Examination Orientation: 0/3 Coding: 3 trial Calculations: 1/3 Naming: 1/3 Recall: 2/5 Attention: 1/2 Visual Processing: deferred d/t baseline blindness Paragraph Memory: 4/8  Total: 9/24; severe cognitive impairment (usually out of 30, but 6  points not given for visual processing)     SLP Assessment  SLP Recommendation/Assessment: Patient needs continued Speech Lanaguage Pathology Services SLP Visit Diagnosis: Cognitive communication deficit (R41.841)    Follow Up Recommendations  Inpatient Rehab    Frequency and Duration min 2x/week  2 weeks      SLP Evaluation Cognition  Overall Cognitive Status: Impaired/Different from baseline (history of deficits, however, suspect exacerbated at this time) Arousal/Alertness: Awake/alert Orientation Level: Oriented to person Attention: Focused Focused Attention: Impaired Memory: Impaired Awareness: Impaired Awareness Impairment: Intellectual impairment;Emergent impairment;Anticipatory impairment Problem Solving: Impaired Problem Solving Impairment: Verbal basic;Functional basic Safety/Judgment: Impaired       Comprehension  Auditory Comprehension Overall Auditory Comprehension: Appears within functional limits for tasks assessed Yes/No Questions: Within Functional Limits Visual Recognition/Discrimination Discrimination: Not tested Reading Comprehension Reading Status: Not tested    Expression Expression Primary Mode of Expression: Verbal Verbal Expression Overall Verbal Expression: Appears within functional limits for tasks assessed Written Expression Dominant Hand: Right Written Expression: Not tested   Oral / Motor  Oral Motor/Sensory Function Overall Oral Motor/Sensory Function: Within functional limits Motor Speech Overall Motor Speech: Appears within functional limits for tasks assessed Respiration: Within functional limits Phonation: Normal Resonance: Within functional limits Articulation: Within functional limitis Intelligibility: Intelligible Motor Planning: Witnin functional limits Motor Speech Errors: Not applicable                      Carlous Olivares P. Domique Reardon, M.S., CCC-SLP Speech-Language Pathologist Acute Rehabilitation Services Pager:  [place] 08/04/2020, 3:50 PM

## 2020-08-04 NOTE — Progress Notes (Signed)
PROGRESS NOTE  KEREM GILMER  DOB: [birth year]  PCP: Patient, No Pcp Per ASN:[id number]  DOA: 08/02/2020  LOS: 2 days   Chief Complaint  Patient presents with  . Code Stroke   Brief narrative: Nathan Hart is a 64 y.o. male with PMH of chronic alcoholism, chronic smoking, CVA, right carotid stenosis status post stenting 2020, PAD, HTN, COPD, legally blind bilaterally Patient presented to the ED on 08/02/2020 with complaint of right sided weakness and a fall.   At the time of presentation in the ED, patient had right arm drift and hand weakness.  He was disoriented.  CT head showed left parieto-occipital acute infarct, and old right CR infarct.   MRI brain showed acute infarcts involving left posterior greater than anterior circulations CTA head and neck showed left CCA possible occlusion, and left ICA reconstitution with slow flow.  Right CCA stent patent, bilateral siphon high-grade stenosis.  Left P2 high-grade stenosis.   Not a TPA candidate because of late presentation. Patient was admitted to hospital service for further stroke work-up. Neurology consultation was obtained.  IR consulted as well.  Patient underwent cerebral angiogram on 8/10.  Subjective: Patient was seen and examined this morning.   Pleasant middle-aged male.  Looks older for his age.  Lying on bed.  Oriented to place only.  Son at bedside.  Son states that his mental status is at baseline. Chart reviewed. No fever.  Blood pressure in 150s to 170s in last 24 hours.  Remains on room air.    Assessment/Plan: Acute left parieto-occipital infarct History of stroke in the past -Presented with right-sided weakness and fall -Imaging findings as above showing acute left parieto-occipital infarcts. -Neurology and IR consultation obtained. -PT/OT eval appreciated.  SNF recommended. -LDL 47, HDL 35, A1c 6 -Echocardiogram done today.  Pending report. -Neurology recommends aspirin 81 mg daily and Plavix 75 mg  daily for 3 months followed by aspirin alone. -Continue frequent neurochecks -Continue to monitor in telemetry.  Intracranial artery stenosis History of right carotid artery stenosis status post stenting in 2020 -CTA head and neck showed left CCA possible occlusion, and left ICA reconstitution with slow flow.  Right CCA stent patent, bilateral siphon high-grade stenosis.  Left P2 high-grade stenosis.   -Seen by IR.  Patient underwent cerebral angiogram on 8/10.  Left CCA distal occlusion was noted.  No intervention was possible.  Essential hypertension -Blood pressure in last 24 hours remains at 150s to 170s. -Start on Coreg 3.125 mg twice daily.  Dementia -Patient remains oriented to place only.  Per son, this is his baseline. -Patient probably has early onset dementia contributed by chronic alcoholism, chronic smoking and multifocal intracranial arterial stenosis.  Chronic alcoholism -Continues to drink. -On CIWA protocol, thiamine and folate. -Counseled to quit  Chronic everyday smoker COPD -Respiratory status stable -Inhalers as needed -Counseled to stop smoking  Legally blind   Mobility: PT/OT eval Code Status:   Code Status: Full Code  Nutritional status: Body mass index is 27.44 kg/m.     Diet Order            Diet Heart Room service appropriate? Yes; Fluid consistency: Thin  Diet effective now                 DVT prophylaxis: enoxaparin (LOVENOX) injection 40 mg Start: 08/02/20 2000   Antimicrobials:  None Fluid: None  Consultants: Neurology, neuroradiology Family Communication:  Discussed with son at bedside.  Status is: Inpatient  Remains inpatient  appropriate because:Ongoing diagnostic testing needed not appropriate for outpatient work up and IV treatments appropriate due to intensity of illness or inability to take PO   Dispo: The patient is from: Home              Anticipated d/c is to: SNF              Anticipated d/c date is: Anticipate  medical stability by tomorrow              Patient currently is not medically stable to d/c.       Infusions:  . sodium chloride 75 mL/hr at 08/04/20 0653    Scheduled Meds: . aspirin EC  325 mg Oral Daily   Or  . aspirin  300 mg Rectal Daily  . atorvastatin  80 mg Oral Daily  . clopidogrel  75 mg Oral Daily  . enoxaparin (LOVENOX) injection  40 mg Subcutaneous Q24H  . folic acid  1 mg Oral Daily  . multivitamin with minerals  1 tablet Oral Daily  . thiamine  100 mg Oral Daily   Or  . thiamine  100 mg Intravenous Daily    Antimicrobials: Anti-infectives (From admission, onward)   None      PRN meds: acetaminophen **OR** acetaminophen (TYLENOL) oral liquid 160 mg/5 mL **OR** acetaminophen, labetalol, LORazepam, LORazepam **OR** LORazepam, perflutren lipid microspheres (DEFINITY) IV suspension, senna-docusate   Objective: Vitals:   08/04/20 0731 08/04/20 1101  BP: (!) 175/92 (!) 154/84  Pulse: 83 88  Resp: 19 20  Temp: 98.1 F (36.7 C) 98.3 F (36.8 C)  SpO2: 96% 95%    Intake/Output Summary (Last 24 hours) at 08/04/2020 1308 Last data filed at 08/04/2020 0400 Gross per 24 hour  Intake 71.25 ml  Output 300 ml  Net -228.75 ml   Filed Weights   08/02/20 1313  Weight: 77.1 kg   Weight change:  Body mass index is 27.44 kg/m.   Physical Exam: General exam: Appears calm and comfortable.  Not in physical distress Skin: No rashes, lesions or ulcers. HEENT: Atraumatic, normocephalic, supple neck, no obvious bleeding Lungs: Clear to auscultation bilaterally CVS: Regular rate and rhythm, no murmur GI/Abd soft, nontender, nondistended, bowel sound present CNS: Alert, awake, oriented to place only.  Not restless or agitated. Psychiatry: Mood appropriate Extremities: No pedal edema, no calf tenderness  Data Review: I have personally reviewed the laboratory data and studies available.  Recent Labs  Lab 08/02/20 1320 08/02/20 1325  WBC 8.1  --   NEUTROABS  6.3  --   HGB 11.0* 12.6*  HCT 39.5 37.0*  MCV 73.3*  --   PLT 291  --    Recent Labs  Lab 08/02/20 1320 08/02/20 1325 08/03/20 0624 08/04/20 0818  NA 138 140  --  134*  K 4.0 4.0  --  3.7  CL 103 103  --  102  CO2 25  --   --  23  GLUCOSE 131* 131*  --  107*  BUN 10 10  --  5*  CREATININE 0.64 0.50*  --  0.58*  CALCIUM 8.8*  --   --  8.4*  MG  --   --  1.8  --    Lab Results  Component Value Date   HGBA1C 6.0 (H) 08/03/2020       Component Value Date/Time   CHOL 102 08/03/2020 0624   TRIG 98 08/03/2020 0624   HDL 35 (L) 08/03/2020 0624   CHOLHDL 2.9 08/03/2020  4099   VLDL 20 08/03/2020 0624   LDLCALC 47 08/03/2020 0624   Signed, Terrilee Croak, MD [facility] Pager: [phone] (Secure Chat preferred). 08/04/2020

## 2020-08-04 NOTE — PMR Pre-admission (Signed)
PMR Admission Coordinator Pre-Admission Assessment  Patient: Nathan Hart is an 64 y.o., male MRN: [id number] DOB: 1956/08/12 Height: 5\' 6"  (167.6 cm) Weight: 77.1 kg              Insurance Information HMO:     PPO:      PCP:      IPA:      80/20:      OTHER:  PRIMARY: Medicare a and b      Policy#: [id number]      Subscriber: pt Benefits:  Phone #: passport one online     Name: 8/11 Eff. Date: 06/24/2020     Deduct: $6010      Out of Pocket Max: none      Life Max: none  CIR: 100%      SNF: 20 full days Outpatient: 80%     Co-Pay: 20% Home Health: 100%      Co-Pay: none DME: 805     Co-Pay: 205 Providers: pt choice  SECONDARY: Medicaid [place] access      Policy#: [id number]      Phone#: 8/11 passport one online [facility]  Financial Counselor:       Phone#:   The Therapist, art Information Summary" for patients in Inpatient Rehabilitation Facilities with attached "Privacy Act [place] Records" was provided and verbally reviewed with: Patient and Family  Emergency Contact Information Contact Information    Name Relation Home Work Mobile   Manraj, Yeo Son   [phone]   Manjinder, Bre[place] Son   [phone]   Ezeriah, Luty Spouse   [phone]     Current Medical History  Patient Admitting Diagnosis: CVA  History of Present Illness: Nathan Hart is a 64 y.o. right-handed male with history of hypertension, CVA, right carotid stenting September 2020 COPD with home oxygen, legally blind bilaterally, smoker and alcohol use as well as questionable medical compliance.  He presented on 08/02/2020 with right side numbness and hemiparesis.  Cranial CT scan showed acute left parietal occipital sequela of remote right MCA territory left basal ganglia insult.  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  Right ICA stent was patent.  Patient did not receive TPA.  MRI acute  infarct left posterior greater than anterior circulations noted presence of a fetal left PCA on the prior CTA no significant mass-effect.  There was minor petechial hemorrhage.  Admission chemistries glucose 131, hemoglobin 11, alcohol negative, urine drug screen negative, urinalysis negative nitrite.  Currently maintained on aspirin and Plavix for CVA prophylaxis.  Subcutaneous Lovenox for DVT prophylaxis.  Tolerating a regular diet.    Complete NIHSS TOTAL: 6 Glasgow Coma Scale Score: 14  Past Medical History  Past Medical History:  Diagnosis Date  . Abnormal peripheral vision of left eye   . Blind right eye   . COPD (chronic obstructive pulmonary disease) (HCC)     home o2 hs and prn  . Dyspnea    with activity  . Empyema lung ([place])   . ETOH abuse   . HTN (hypertension)   . Peripheral vascular disease ([place])   . Pneumonia 2020  . Stroke [facility])     Family History  family history includes Cancer in his mother.  Prior Rehab/Hospitalizations:  Has the patient had prior rehab or hospitalizations prior to admission? Yes  Has the patient had major surgery during 100 days prior to admission? No  Current Medications   Current Facility-Administered Medications:  .  0.9 %  sodium chloride infusion, , Intravenous, Continuous, Danford, Suann Larry, MD, Last Rate: 75 mL/hr at 08/04/20 0653, New Bag at 08/04/20 0653 .  acetaminophen (TYLENOL) tablet 650 mg, 650 mg, Oral, Q4H PRN, 650 mg at 08/05/20 0833 **OR** acetaminophen (TYLENOL) 160 MG/5ML solution 650 mg, 650 mg, Per Tube, Q4H PRN **OR** acetaminophen (TYLENOL) suppository 650 mg, 650 mg, Rectal, Q4H PRN, Danford, Suann Larry, MD .  aspirin EC tablet 325 mg, 325 mg, Oral, Daily, 325 mg at 08/05/20 0956 **OR** aspirin suppository 300 mg, 300 mg, Rectal, Daily, Danford, Christopher P, MD .  atorvastatin (LIPITOR) tablet 80 mg, 80 mg, Oral, Daily, Danford, Suann Larry, MD, 80 mg at 08/05/20 0956 .  carvedilol (COREG) tablet 3.125 mg,  3.125 mg, Oral, BID WC, Dahal, Binaya, MD, 3.125 mg at 08/05/20 0824 .  clopidogrel (PLAVIX) tablet 75 mg, 75 mg, Oral, Daily, Danford, Suann Larry, MD, 75 mg at 08/05/20 0956 .  enoxaparin (LOVENOX) injection 40 mg, 40 mg, Subcutaneous, Q24H, Danford, Suann Larry, MD, 40 mg at 08/04/20 2103 .  folic acid (FOLVITE) tablet 1 mg, 1 mg, Oral, Daily, Danford, Suann Larry, MD, 1 mg at 08/05/20 0956 .  labetalol (NORMODYNE) injection 10 mg, 10 mg, Intravenous, Q6H PRN, Danford, Christopher P, MD .  LORazepam (ATIVAN) injection 1 mg, 1 mg, Intravenous, Once PRN, Danford, Suann Larry, MD .  LORazepam (ATIVAN) tablet 1-4 mg, 1-4 mg, Oral, Q1H PRN **OR** LORazepam (ATIVAN) injection 1-4 mg, 1-4 mg, Intravenous, Q1H PRN, Danford, Suann Larry, MD .  multivitamin with minerals tablet 1 tablet, 1 tablet, Oral, Daily, Danford, Suann Larry, MD, 1 tablet at 08/05/20 0956 .  senna-docusate (Senokot-S) tablet 1 tablet, 1 tablet, Oral, QHS PRN, Danford, Suann Larry, MD .  thiamine tablet 100 mg, 100 mg, Oral, Daily, 100 mg at 08/05/20 0956 **OR** thiamine (B-1) injection 100 mg, 100 mg, Intravenous, Daily, Danford, Suann Larry, MD  Patients Current Diet:  Diet Order            Diet Heart Room service appropriate? Yes; Fluid consistency: Thin  Diet effective now                 Precautions / Restrictions Precautions Precautions: Fall Restrictions Weight Bearing Restrictions: No Other Position/Activity Restrictions: BLE strength WFL   Has the patient had 2 or more falls or a fall with injury in the past year?No  Prior Activity Level Community (5-7x/wk): Independent without AD pta; legally blind for 1 year; does not drive; alcoholic which son reports has affected his cognition  Prior Functional Level Prior Function Level of Independence: Independent Comments: I clairified PLOF and home layout with son, [name]  Self Care: Did the patient need help bathing, dressing, using the toilet or  eating?  Independent  Indoor Mobility: Did the patient need assistance with walking from room to room (with or without device)? Independent  Stairs: Did the patient need assistance with internal or external stairs (with or without device)? Independent  Functional Cognition: Did the patient need help planning regular tasks such as shopping or remembering to take medications? Needed some help  Home Assistive Devices / [place] Devices/Equipment: None  Prior Device Use: Indicate devices/aids used by the patient prior to current illness, exacerbation or injury? None of the above  Current Functional Level Cognition  Arousal/Alertness: Awake/alert Overall Cognitive Status: Impaired/Different from baseline (history of deficits, however, suspect exacerbated at this time) Difficult to assess due  to: Impaired communication Current Attention Level: Sustained Orientation Level: Oriented to person, Oriented to place, Disoriented to time, Disoriented to situation Following Commands: Follows one step commands consistently, Follows one step commands with increased time, Follows multi-step commands inconsistently Safety/Judgement: Decreased awareness of safety, Decreased awareness of deficits General Comments: Pt oriented to self and place only, reoriented to situation and time.  Follows commands with increased time, but poor attention and recall.  Noted baseline cognitive deficits per chart review.  Attention: Focused Focused Attention: Impaired Memory: Impaired Awareness: Impaired Awareness Impairment: Intellectual impairment, Emergent impairment, Anticipatory impairment Problem Solving: Impaired Problem Solving Impairment: Verbal basic, Functional basic Safety/Judgment: Impaired    Extremity Assessment (includes Sensation/Coordination)  Upper Extremity Assessment: RUE deficits/detail RUE Deficits / Details: grossly 3-/5 MMT, unable to use functionally or use as functional  support RUE Sensation: WNL RUE Coordination: decreased fine motor, decreased gross motor  Lower Extremity Assessment: Defer to PT evaluation    ADLs  Overall ADL's : Needs assistance/impaired Grooming: Moderate assistance, Sitting Upper Body Bathing: Moderate assistance, Sitting Lower Body Bathing: Moderate assistance, Sit to/from stand Upper Body Dressing : Moderate assistance, Sitting Lower Body Dressing: Moderate assistance, Sit to/from stand Lower Body Dressing Details (indicate cue type and reason): able to adjust socks but would need assist to don  Toilet Transfer: Minimal assistance, Stand-pivot Toilet Transfer Details (indicate cue type and reason): simulated from reclienr to EOB  Functional mobility during ADLs: Minimal assistance, Cueing for safety, Cueing for sequencing General ADL Comments: pt limited by cognition, R sided weakness, and impaired balance     Mobility  Overal bed mobility: Modified Independent General bed mobility comments: returned to supine without assist     Transfers  Overall transfer level: Needs assistance Equipment used: 1 person hand held assist Transfers: Sit to/from Stand, Stand Pivot Transfers Sit to Stand: Min assist Stand pivot transfers: Min assist General transfer comment: min assist from recliner to power up and steady, verbal cueing for pivot to EOB due to visual deficits     Ambulation / Gait / Stairs / Wheelchair Mobility  Ambulation/Gait General Gait Details: defered    Posture / Balance Dynamic Sitting Balance Sitting balance - Comments: sat EOB for ~5 min during orthostatic vitals Balance Overall balance assessment: Needs assistance Sitting-balance support: Feet supported Sitting balance-Leahy Scale: Fair Sitting balance - Comments: sat EOB for ~5 min during orthostatic vitals Standing balance support: No upper extremity supported, During functional activity Standing balance-Leahy Scale: Fair Standing balance comment: relies  on 1 UE support during transfer    Special needs/care consideration ETOH abuse per son Smoker Admit day Visitor  is Stormy   Previous Geologist, engineering:  (lives with son, [name] and his girlfriend, Psychologist, counselling)  Lives With: Son, Other (Comment) Available Help at Discharge: Family, Available 24 hours/day Type of Home: House Home Layout: One level Home Access: Stairs to enter (1 step onto porch and 1 step into home) Entrance Stairs-Rails: None Entrance Stairs-Number of Steps: 1 step onto porch and one step into home Bathroom Shower/Tub: Tub/shower unit, Architectural technologist: Standard Bathroom Accessibility: Yes How Accessible: Accessible via walker [place]: No Additional Comments: pateitn lives with son, [name] and [place] girlfriend, Stormy  Discharge Living Setting Plans for Discharge Living Setting: Lives with (comment) (son, [name] Type of Home at Discharge: House Discharge Home Layout: One level Discharge Home Access: Stairs to enter Entrance Stairs-Rails: None Entrance Stairs-Number of Steps: 1 step onto porch and 1 step into home Discharge Bathroom  Shower/Tub: Tub/shower unit, Curtain Discharge Bathroom Toilet: Standard Discharge Bathroom Accessibility: Yes How Accessible: Accessible via walker Does the patient have any problems obtaining your medications?: No  Social/Family/Support Systems Patient Roles: Parent Contact Information: son, [name] is main contact Anticipated Caregiver: [name] and Stormy Anticipated Caregiver's Contact Information: [phone] Ability/Limitations of Caregiver: none Caregiver Availability: 24/7 Discharge Plan Discussed with Primary Caregiver: Yes Is Caregiver In Agreement with Plan?: Yes Does Caregiver/Family have Issues with Lodging/Transportation while Pt is in Rehab?: No  Goals Patient/Family Goal for Rehab: Mod I to supervision with PT, OT, and SLP Expected length of stay: ELOS 5 to 8  days Additional Information: ETOH abuse 6 pack per day Pt/Family Agrees to Admission and willing to participate: Yes Program Orientation Provided & Reviewed with Pt/Caregiver Including Roles  & Responsibilities: Yes  Decrease burden of Care through IP rehab admission: n/a  Possible need for SNF placement upon discharge:not anticipated  Patient Condition: This patient's condition remains as documented in the consult dated 08/04/2020, in which the Rehabilitation Physician determined and documented that the patient's condition is appropriate for intensive rehabilitative care in an inpatient rehabilitation facility. Will admit to inpatient rehab today.  Preadmission Screen Completed By:  Cleatrice Burke, RN, 08/05/2020 1:40 PM ______________________________________________________________________   Discussed status with Dr. Dagoberto Ligas on 08/05/2020 at  1341 and received approval for admission today.  Admission Coordinator:  Cleatrice Burke, time 1341 Date 08/05/2020

## 2020-08-04 NOTE — Progress Notes (Signed)
Inpatient Rehabilitation Admissions Coordinator  I met with patient at bedside and spoke with his son, [name], by phone. Son can provide 24/7 supervision with his girlfriend, Stormy assisting. They prefer Cir rather than SNF for his rehab. I will follow up tomorrow to assist with planning Cir admit once medical workup complete.  Danne Baxter, RN, MSN Rehab Admissions Coordinator [phone] 08/04/2020 4:16 PM

## 2020-08-04 NOTE — NC FL2 (Signed)
[place] LEVEL OF CARE SCREENING TOOL     IDENTIFICATION  Patient Name: Nathan Hart Birthdate: Apr 19, 1956 Sex: male Admission Date (Current Location): 08/02/2020  [address] [employer] and [place] Number:  Herbalist and Address:  [facility], [place] [address], [place], [place] [address]      Provider Number: [id number]  Attending Physician Name and Address:  Terrilee Croak, MD  Relative Name and Phone Number:       Current Level of Care: Hospital Recommended Level of Care: [place] Prior Approval Number:    Date Approved/Denied:   PASRR Number: [id number]  Discharge Plan: SNF    Current Diagnoses: Patient Active Problem List   Diagnosis Date Noted  . Cerebral embolism with cerebral infarction 08/02/2020  . Stroke (cerebrum) ([place]) 08/02/2020  . Stroke ([place]) 08/02/2020  . Carotid stenosis, symptomatic w/o infarct 09/24/2019  . PAD (peripheral artery disease) /Bil ICA Stenosis 09/08/2019  . Delirium tremens ([place]) 09/08/2019  . COPD (chronic obstructive pulmonary disease) ([place]) 09/07/2019  . Chest wall pain 09/07/2019  . Syncope 09/07/2019  . Community acquired pneumonia 09/07/2019  . Abnormal liver function/Alcoholic Hepatitis 30/06/6225    Orientation RESPIRATION BLADDER Height & Weight     Self, Place  Normal Continent Weight: 170 lb (77.1 kg) Height:  5\' 6"  (167.6 cm)  BEHAVIORAL SYMPTOMS/MOOD NEUROLOGICAL BOWEL NUTRITION STATUS      Continent Diet (heart healthy)  AMBULATORY STATUS COMMUNICATION OF NEEDS Skin   Limited Assist Verbally Normal                       Personal Care Assistance Level of Assistance  Bathing, Feeding, Dressing Bathing Assistance: Limited assistance Feeding assistance: Limited assistance Dressing Assistance: Limited assistance     Functional Limitations Info  Sight Sight Info: Impaired        SPECIAL CARE FACTORS FREQUENCY  PT (By licensed PT), OT (By licensed  OT)     PT Frequency: 5x/wk OT Frequency: 5x/wk            Contractures Contractures Info: Not present    Additional Factors Info  Code Status, Allergies Code Status Info: Full Allergies Info: NKA           Current Medications (08/04/2020):  This is the current hospital active medication list Current Facility-Administered Medications  Medication Dose Route Frequency Provider Last Rate Last Admin  . 0.9 %  sodium chloride infusion   Intravenous Continuous Edwin Dada, MD 75 mL/hr at 08/04/20 0653 New Bag at 08/04/20 3335  . acetaminophen (TYLENOL) tablet 650 mg  650 mg Oral Q4H PRN Danford, Suann Larry, MD       Or  . acetaminophen (TYLENOL) 160 MG/5ML solution 650 mg  650 mg Per Tube Q4H PRN Danford, Suann Larry, MD       Or  . acetaminophen (TYLENOL) suppository 650 mg  650 mg Rectal Q4H PRN Danford, Suann Larry, MD      . aspirin EC tablet 325 mg  325 mg Oral Daily Danford, Suann Larry, MD   325 mg at 08/04/20 0845   Or  . aspirin suppository 300 mg  300 mg Rectal Daily Danford, Suann Larry, MD      . atorvastatin (LIPITOR) tablet 80 mg  80 mg Oral Daily Edwin Dada, MD   80 mg at 08/04/20 0846  . clopidogrel (PLAVIX) tablet 75 mg  75 mg Oral Daily Danford, Suann Larry, MD  75 mg at 08/04/20 0850  . enoxaparin (LOVENOX) injection 40 mg  40 mg Subcutaneous Q24H Edwin Dada, MD   40 mg at 08/03/20 2103  . folic acid (FOLVITE) tablet 1 mg  1 mg Oral Daily Danford, Suann Larry, MD   1 mg at 08/04/20 0845  . labetalol (NORMODYNE) injection 10 mg  10 mg Intravenous Q6H PRN Danford, Suann Larry, MD      . LORazepam (ATIVAN) injection 1 mg  1 mg Intravenous Once PRN Danford, Suann Larry, MD      . LORazepam (ATIVAN) tablet 1-4 mg  1-4 mg Oral Q1H PRN Danford, Suann Larry, MD       Or  . LORazepam (ATIVAN) injection 1-4 mg  1-4 mg Intravenous Q1H PRN Danford, Suann Larry, MD      . multivitamin with minerals tablet 1 tablet  1  tablet Oral Daily Danford, Suann Larry, MD   1 tablet at 08/04/20 0846  . senna-docusate (Senokot-S) tablet 1 tablet  1 tablet Oral QHS PRN Danford, Suann Larry, MD      . thiamine tablet 100 mg  100 mg Oral Daily Danford, Suann Larry, MD   100 mg at 08/04/20 0846   Or  . thiamine (B-1) injection 100 mg  100 mg Intravenous Daily Danford, Suann Larry, MD         Discharge Medications: Please see discharge summary for a list of discharge medications.  Relevant Imaging Results:  Relevant Lab Results:   Additional Information SS#: [id number]  Geralynn Ochs, LCSW

## 2020-08-04 NOTE — Progress Notes (Signed)
  Echocardiogram 2D Echocardiogram with definity has been performed.  Darlina Sicilian M 08/04/2020, 11:22 AM

## 2020-08-04 NOTE — Progress Notes (Signed)
Physical Therapy Treatment Patient Details Name: Nathan Hart MRN: [id number] DOB: 05/07/1956 Today's Date: 08/04/2020    History of Present Illness 64 yo male with onset of acute R side hemiparesis was brought to hosp, noted L frontal, thalamic and parietal strokes.  PMHx:  lumbar surgeries, L Eye limited peripheral vision, COPD, empyema, EtOH abuse, PVD, PNA, stroke, R eye blind, R ACL surgery, clavicle surgery    PT Comments    Pt supine in bed on arrival and agreeable to participate with therapy. Orthostatic vitals taken during session (see chart below). After standing for ~3 min pt requested seated rest break before transferring to recliner chair. Pt son present and states that pt lives with son and son's girl friend. There should be 24/7 assist available. At this time SNF continues to remain appropriate for d/c plans, however pt may be able to progress to HHPT. Will continue to follow acutely.     08/04/20 1300  Orthostatic Lying   BP- Lying 173/85  Pulse- Lying 88  Orthostatic Sitting  BP- Sitting 119/86  Pulse- Sitting 91  Orthostatic Standing at 0 minutes  BP- Standing at 0 minutes 121/68  Pulse- Standing at 0 minutes 96  Orthostatic Standing at 3 minutes  BP- Standing at 3 minutes (!) 136/97  Pulse- Standing at 3 minutes 110     Follow Up Recommendations  SNF;Supervision for mobility/OOB     Equipment Recommendations  None recommended by PT    Recommendations for Other Services       Precautions / Restrictions Precautions Precautions: Fall Restrictions Weight Bearing Restrictions: No Other Position/Activity Restrictions: BLE strength WFL    Mobility  Bed Mobility Overal bed mobility: Modified Independent             General bed mobility comments: mod I to sit up in bed  Transfers Overall transfer level: Needs assistance Equipment used: Rolling walker (2 wheeled);None Transfers: Sit to/from [employer] to Stand: Min  assist;Min guard Stand pivot transfers: Min guard       General transfer comment: Pt stood from EOB with RW for orthostatic vitals. Min A given to power up. Pt stood a second time from EOB with min guard for safety and pivoted to recliner chair w/o AD  Ambulation/Gait             General Gait Details: defered   Stairs             Wheelchair Mobility    Modified Rankin (Stroke Patients Only)       Balance Overall balance assessment: Needs assistance Sitting-balance support: Feet supported Sitting balance-Leahy Scale: Fair Sitting balance - Comments: sat EOB for ~5 min during orthostatic vitals   Standing balance support: No upper extremity supported (could not get pt to try to stand) Standing balance-Leahy Scale: Fair Standing balance comment: can perform transfer w/o AD                            Cognition Arousal/Alertness: Awake/alert Behavior During Therapy: Flat affect Overall Cognitive Status: Difficult to assess                                 General Comments: Pt giving short one word answers to questions. He was disoriantated to situation. Son present and states that pt is more talkitive with family but closes up around others at baseline. Follows commands  with increased time      Exercises      General Comments General comments (skin integrity, edema, etc.): Pt son present for session.       Pertinent Vitals/Pain Pain Assessment: No/denies pain    Home Living                      Prior Function            PT Goals (current goals can now be found in the care plan section) Acute Rehab PT Goals Patient Stated Goal: none stated PT Goal Formulation: With patient Time For Goal Achievement: 08/17/20 Potential to Achieve Goals: Good Progress towards PT goals: Progressing toward goals    Frequency    Min 3X/week      PT Plan Current plan remains appropriate    Co-evaluation               AM-PAC PT "6 Clicks" Mobility   Outcome Measure  Help needed turning from your back to your side while in a flat bed without using bedrails?: A Little Help needed moving from lying on your back to sitting on the side of a flat bed without using bedrails?: A Little Help needed moving to and from a bed to a chair (including a wheelchair)?: A Little Help needed standing up from a chair using your arms (e.g., wheelchair or bedside chair)?: A Little Help needed to walk in hospital room?: A Little Help needed climbing 3-5 steps with a railing? : A Lot 6 Click Score: 17    End of Session Equipment Utilized During Treatment: Oxygen Activity Tolerance: Patient tolerated treatment well;Patient limited by fatigue Patient left: with call bell/phone within reach;in chair;with chair alarm set;with family/visitor present Nurse Communication: Mobility status PT Visit Diagnosis: Unsteadiness on feet (R26.81);Difficulty in walking, not elsewhere classified (R26.2)     Time: 5945-8592 PT Time Calculation (min) (ACUTE ONLY): 40 min  Charges:  $Therapeutic Activity: 23-37 mins $Neuromuscular Re-education: 8-22 mins                    Benjiman Core, [place] Pager [phone] Acute Rehab   Allena Katz 08/04/2020, 1:02 PM

## 2020-08-04 NOTE — TOC CAGE-AID Note (Signed)
Transition of Care [facility]) - CAGE-AID Screening   Patient Details  Name: Nathan Hart MRN: [id number] Date of Birth: [birth year]  [facility] CM/SW Contact:    Emeterio Reeve, [place] Phone Number: 08/04/2020, 3:53 PM   Clinical Narrative:  CSW met with pt at bedside. CSW introduced self and explained her role at the hospital.  Pt denied alcohol use and substance use. Pt did not need resources at this time.   CAGE-AID Screening:    Have You Ever Felt You Ought to Cut Down on Your Drinking or Drug Use?: No Have People Annoyed You By Critizing Your Drinking Or Drug Use?: No Have You Felt Bad Or Guilty About Your Drinking Or Drug Use?: No Have You Ever Had a Drink or Used Drugs First Thing In The Morning to Steady Your Nerves or to Get Rid of a Hangover?: No CAGE-AID Score: 0  Substance Abuse Education Offered: Yes    Blima Ledger, [place] Social Worker [phone]

## 2020-08-05 ENCOUNTER — Other Ambulatory Visit: Payer: Self-pay

## 2020-08-05 ENCOUNTER — Inpatient Hospital Stay (HOSPITAL_COMMUNITY)
Admission: RE | Admit: 2020-08-05 | Discharge: 2020-08-19 | DRG: 056 | Disposition: A | Discharge status: Hospice | Payer: Medicare Other | Source: Intra-hospital | Attending: Physical Medicine & Rehabilitation | Admitting: Physical Medicine & Rehabilitation

## 2020-08-05 DIAGNOSIS — K209 Esophagitis, unspecified without bleeding: Secondary | ICD-10-CM | POA: Diagnosis present

## 2020-08-05 DIAGNOSIS — Z515 Encounter for palliative care: Secondary | ICD-10-CM | POA: Diagnosis present

## 2020-08-05 DIAGNOSIS — K769 Liver disease, unspecified: Secondary | ICD-10-CM | POA: Diagnosis present

## 2020-08-05 DIAGNOSIS — I951 Orthostatic hypotension: Secondary | ICD-10-CM | POA: Diagnosis present

## 2020-08-05 DIAGNOSIS — K2970 Gastritis, unspecified, without bleeding: Secondary | ICD-10-CM | POA: Diagnosis present

## 2020-08-05 DIAGNOSIS — R52 Pain, unspecified: Secondary | ICD-10-CM

## 2020-08-05 DIAGNOSIS — E785 Hyperlipidemia, unspecified: Secondary | ICD-10-CM | POA: Diagnosis present

## 2020-08-05 DIAGNOSIS — H548 Legal blindness, as defined in USA: Secondary | ICD-10-CM | POA: Diagnosis present

## 2020-08-05 DIAGNOSIS — D62 Acute posthemorrhagic anemia: Secondary | ICD-10-CM | POA: Diagnosis present

## 2020-08-05 DIAGNOSIS — R918 Other nonspecific abnormal finding of lung field: Secondary | ICD-10-CM | POA: Diagnosis present

## 2020-08-05 DIAGNOSIS — K264 Chronic or unspecified duodenal ulcer with hemorrhage: Secondary | ICD-10-CM | POA: Diagnosis present

## 2020-08-05 DIAGNOSIS — F0150 Vascular dementia without behavioral disturbance: Secondary | ICD-10-CM | POA: Diagnosis not present

## 2020-08-05 DIAGNOSIS — I10 Essential (primary) hypertension: Secondary | ICD-10-CM | POA: Diagnosis present

## 2020-08-05 DIAGNOSIS — I69351 Hemiplegia and hemiparesis following cerebral infarction affecting right dominant side: Principal | ICD-10-CM

## 2020-08-05 DIAGNOSIS — G8191 Hemiplegia, unspecified affecting right dominant side: Secondary | ICD-10-CM | POA: Diagnosis not present

## 2020-08-05 DIAGNOSIS — Z809 Family history of malignant neoplasm, unspecified: Secondary | ICD-10-CM | POA: Diagnosis not present

## 2020-08-05 DIAGNOSIS — H3530 Unspecified macular degeneration: Secondary | ICD-10-CM | POA: Diagnosis present

## 2020-08-05 DIAGNOSIS — Z79899 Other long term (current) drug therapy: Secondary | ICD-10-CM | POA: Diagnosis not present

## 2020-08-05 DIAGNOSIS — K922 Gastrointestinal hemorrhage, unspecified: Secondary | ICD-10-CM | POA: Diagnosis not present

## 2020-08-05 DIAGNOSIS — D649 Anemia, unspecified: Secondary | ICD-10-CM | POA: Diagnosis present

## 2020-08-05 DIAGNOSIS — I739 Peripheral vascular disease, unspecified: Secondary | ICD-10-CM | POA: Diagnosis present

## 2020-08-05 DIAGNOSIS — R059 Cough, unspecified: Secondary | ICD-10-CM

## 2020-08-05 DIAGNOSIS — I63512 Cerebral infarction due to unspecified occlusion or stenosis of left middle cerebral artery: Secondary | ICD-10-CM | POA: Diagnosis not present

## 2020-08-05 DIAGNOSIS — F0390 Unspecified dementia without behavioral disturbance: Secondary | ICD-10-CM | POA: Diagnosis present

## 2020-08-05 DIAGNOSIS — J449 Chronic obstructive pulmonary disease, unspecified: Secondary | ICD-10-CM | POA: Diagnosis present

## 2020-08-05 DIAGNOSIS — I639 Cerebral infarction, unspecified: Secondary | ICD-10-CM | POA: Diagnosis not present

## 2020-08-05 DIAGNOSIS — F17210 Nicotine dependence, cigarettes, uncomplicated: Secondary | ICD-10-CM | POA: Diagnosis present

## 2020-08-05 DIAGNOSIS — Z9981 Dependence on supplemental oxygen: Secondary | ICD-10-CM

## 2020-08-05 DIAGNOSIS — Z7189 Other specified counseling: Secondary | ICD-10-CM | POA: Diagnosis not present

## 2020-08-05 DIAGNOSIS — R4701 Aphasia: Secondary | ICD-10-CM | POA: Diagnosis present

## 2020-08-05 LAB — BASIC METABOLIC PANEL
Anion gap: 10 (ref 5–15)
BUN: 9 mg/dL (ref 8–23)
CO2: 23 mmol/L (ref 22–32)
Calcium: 8.3 mg/dL — ABNORMAL LOW (ref 8.9–10.3)
Chloride: 103 mmol/L (ref 98–111)
Creatinine, Ser: 0.72 mg/dL (ref 0.61–1.24)
GFR calc Af Amer: >60 mL/min (ref >60)
GFR calc non Af Amer: >60 mL/min (ref >60)
Glucose, Bld: 103 mg/dL — ABNORMAL HIGH (ref 70–99)
Potassium: 3.6 mmol/L (ref 3.5–5.1)
Sodium: 136 mmol/L (ref 135–145)

## 2020-08-05 MED ORDER — ADULT MULTIVITAMIN W/MINERALS CH: 1.0000 | ORAL_TABLET | Freq: Every day | ORAL | Status: AC

## 2020-08-05 MED ORDER — SORBITOL 70 % SOLN
30.0000 mL | Freq: Every day | Status: DC | PRN
  Administered 2020-08-15: 30 mL via ORAL
  Filled 2020-08-05: qty 30

## 2020-08-05 MED ORDER — ASPIRIN 325 MG PO TBEC: 325.0000 mg | DELAYED_RELEASE_TABLET | Freq: Every day | ORAL | 0 refills | Status: AC

## 2020-08-05 MED ORDER — THIAMINE HCL 100 MG/ML IJ SOLN
100.0000 mg | Freq: Every day | INTRAMUSCULAR | Status: DC
  Filled 2020-08-05 (×15): qty 1

## 2020-08-05 MED ORDER — CLOPIDOGREL BISULFATE 75 MG PO TABS
75.0000 mg | ORAL_TABLET | Freq: Every day | ORAL | Status: DC
  Administered 2020-08-06 – 2020-08-16 (×11): 75 mg via ORAL
  Filled 2020-08-05 (×11): qty 1

## 2020-08-05 MED ORDER — ASPIRIN 300 MG RE SUPP
300.0000 mg | Freq: Every day | RECTAL | Status: DC
  Filled 2020-08-05 (×5): qty 1

## 2020-08-05 MED ORDER — CARVEDILOL 3.125 MG PO TABS
3.1250 mg | ORAL_TABLET | Freq: Two times a day (BID) | ORAL | Status: DC
  Administered 2020-08-05 – 2020-08-07 (×4): 3.125 mg via ORAL
  Filled 2020-08-05 (×4): qty 1

## 2020-08-05 MED ORDER — THIAMINE HCL 100 MG PO TABS: 100.0000 mg | ORAL_TABLET | Freq: Every day | ORAL | Status: DC

## 2020-08-05 MED ORDER — ASPIRIN EC 325 MG PO TBEC
325.0000 mg | DELAYED_RELEASE_TABLET | Freq: Every day | ORAL | Status: DC
  Administered 2020-08-06 – 2020-08-19 (×12): 325 mg via ORAL
  Filled 2020-08-05 (×14): qty 1

## 2020-08-05 MED ORDER — CARVEDILOL 3.125 MG PO TABS: 3.1250 mg | ORAL_TABLET | Freq: Two times a day (BID) | ORAL | Status: DC

## 2020-08-05 MED ORDER — ATORVASTATIN CALCIUM 80 MG PO TABS
80.0000 mg | ORAL_TABLET | Freq: Every day | ORAL | Status: DC
  Administered 2020-08-06 – 2020-08-19 (×13): 80 mg via ORAL
  Filled 2020-08-05 (×14): qty 1

## 2020-08-05 MED ORDER — CLOPIDOGREL BISULFATE 75 MG PO TABS: 75.0000 mg | ORAL_TABLET | Freq: Every day | ORAL | Status: DC

## 2020-08-05 MED ORDER — ACETAMINOPHEN 160 MG/5ML PO SOLN: 650.0000 mg | ORAL | Status: DC | PRN

## 2020-08-05 MED ORDER — ENOXAPARIN SODIUM 40 MG/0.4ML ~~LOC~~ SOLN
40.0000 mg | SUBCUTANEOUS | Status: DC
  Administered 2020-08-05 – 2020-08-15 (×11): 40 mg via SUBCUTANEOUS
  Filled 2020-08-05 (×11): qty 0.4

## 2020-08-05 MED ORDER — ENOXAPARIN SODIUM 40 MG/0.4ML ~~LOC~~ SOLN: 40.0000 mg | SUBCUTANEOUS | Status: DC

## 2020-08-05 MED ORDER — SENNOSIDES-DOCUSATE SODIUM 8.6-50 MG PO TABS: 1.0000 | ORAL_TABLET | Freq: Every evening | ORAL | Status: DC | PRN

## 2020-08-05 MED ORDER — SENNOSIDES-DOCUSATE SODIUM 8.6-50 MG PO TABS
1.0000 | ORAL_TABLET | Freq: Every evening | ORAL | Status: DC | PRN
  Administered 2020-08-11: 1 via ORAL
  Filled 2020-08-05 (×2): qty 1

## 2020-08-05 MED ORDER — ADULT MULTIVITAMIN W/MINERALS CH
1.0000 | ORAL_TABLET | Freq: Every day | ORAL | Status: DC
  Administered 2020-08-06 – 2020-08-19 (×13): 1 via ORAL
  Filled 2020-08-05 (×14): qty 1

## 2020-08-05 MED ORDER — ACETAMINOPHEN 325 MG PO TABS
650.0000 mg | ORAL_TABLET | ORAL | Status: DC | PRN
  Administered 2020-08-14: 650 mg via ORAL
  Filled 2020-08-05 (×2): qty 2

## 2020-08-05 MED ORDER — FOLIC ACID 1 MG PO TABS
1.0000 mg | ORAL_TABLET | Freq: Every day | ORAL | Status: DC
  Administered 2020-08-06 – 2020-08-19 (×13): 1 mg via ORAL
  Filled 2020-08-05 (×14): qty 1

## 2020-08-05 MED ORDER — THIAMINE HCL 100 MG PO TABS
100.0000 mg | ORAL_TABLET | Freq: Every day | ORAL | Status: DC
  Administered 2020-08-06 – 2020-08-19 (×13): 100 mg via ORAL
  Filled 2020-08-05 (×14): qty 1

## 2020-08-05 MED ORDER — ACETAMINOPHEN 650 MG RE SUPP: 650.0000 mg | RECTAL | Status: DC | PRN

## 2020-08-05 NOTE — Discharge Summary (Signed)
Physician Discharge Summary  Nathan Hart:[id number] DOB: 08-17-1956 DOA: 08/02/2020  PCP: Patient, No Pcp Per  Admit date: 08/02/2020 Discharge date: 08/05/2020  Admitted From: Home Discharge disposition: CIR   Code Status: Full Code  Diet Recommendation: Cardiac diet  Discharge Diagnosis:   Principal Problem:   Stroke (cerebrum) ([place]) Active Problems:   COPD (chronic obstructive pulmonary disease) ([place])   PAD (peripheral artery disease) /Bil ICA Stenosis   Cerebral embolism with cerebral infarction   Stroke [facility])   Acute CVA (cerebrovascular accident) ([place])   Essential hypertension   History of CVA (cerebrovascular accident)   Supplemental oxygen dependent   Substance abuse ([place])   History of Present Illness / Brief narrative:  Nathan Hart is a 64 y.o. male with PMH of chronic alcoholism, chronic smoking, CVA, right carotid stenosis status post stenting 2020, PAD, HTN, COPD, legally blind bilaterally Patient presented to the ED on 08/02/2020 with complaint of right sided weakness and a fall.   At the time of presentation in the ED, patient had right arm drift and hand weakness.  He was disoriented.  CT head showed left parieto-occipital acute infarct, and old right CR infarct.  MRI brain showed acute infarcts involving left posterior greater than anterior circulations CTA head and neck showed left CCA possible occlusion, and left ICA reconstitution with slow flow. Right CCA stent patent, bilateral siphon high-grade stenosis. Left P2 high-grade stenosis.  Not a TPA candidate because of late presentation. Patient was admitted to hospital service for further stroke work-up. Neurology consultation was obtained.  IR consulted as well.  Patient underwent cerebral angiogram on 8/10.  Hospital Course:  Acute left parieto-occipital infarct History of stroke in the past -Presented with right-sided weakness and fall -Imaging findings as above showing acute left  parieto-occipital infarcts. -Neurology and IR consultation were obtained. -PT/OT eval appreciated.    CIR recommended. -LDL 47, HDL 35, A1c 6 -Echocardiogram  showed EF of 55 to 58%, grade 1 diastolic dysfunction, no intracardiac source of embolism. -Neurology recommends aspirin 325 mg daily and Plavix 75 mg daily for 3 months followed by aspirin alone.  Intracranial artery stenosis History of right carotid artery stenosis status post stenting in 2020 -CTA head and neck showed left CCA possible occlusion, and left ICA reconstitution with slow flow. Right CCA stent patent, bilateral siphon high-grade stenosis. Left P2 high-grade stenosis.  -Seen by IR.  Patient underwent cerebral angiogram on 8/10.  Left CCA distal occlusion was noted.  No intervention was possible.  Essential hypertension -Blood pressure is now better on Coreg 3.125mg  twice daily.  Continue same.  Dementia -Patient remains oriented to place only.  Per patient's son, this is his baseline. -Patient probably has early onset dementia contributed by chronic alcoholism, chronic smoking and multifocal intracranial arterial stenosis.  Chronic alcoholism -was drinking prior to presentation.  No withdrawal symptoms in the hospital. -Counseled to quit  Chronic everyday smoker COPD -Respiratory status stable -Inhalers as needed -Counseled to stop smoking  Legally blind  Wound care:    Subjective:  Seen and examined this morning.  Pleasant.  Not in distress.  No new symptoms.   Discharge Exam:   Vitals:   08/04/20 2358 08/05/20 0323 08/05/20 0745 08/05/20 1116  BP: (!) 178/84 127/78 (!) 148/86 125/63  Pulse: 99 88 64 88  Resp: 20 18 19 18   Temp: (!) 97.4 F (36.3 C) 99.5 F (37.5 C) 98 F (36.7 C) 98.3 F (36.8 C)  TempSrc: Oral Oral Oral  Oral  SpO2: 94% 95% 96% 93%  Weight:      Height:        Body mass index is 27.44 kg/m.  General exam: Appears calm and comfortable.  Not in physical  distress Skin: No rashes, lesions or ulcers. HEENT: Atraumatic, normocephalic, supple neck, no obvious bleeding Lungs: Clear to auscultate bilaterally CVS: Regular rate and rhythm, no murmur GI/Abd soft, nontender, nondistended, bowel sound present CNS: Alert, awake, oriented to place Psychiatry: Depressed look Extremities: No pedal edema, no calf tenderness  Follow ups:   Discharge Instructions    Ambulatory referral to Neurology   Complete by: As directed    Diet - low sodium heart healthy   Complete by: As directed    Increase activity slowly   Complete by: As directed       Follow-up Information    [facility] Follow up.   Contact information: [place] [id number] [phone]       [facility] Follow up in 4 week(s).   Contact information: [address]     [place] [address] [phone]              Recommendations for Outpatient Follow-Up:   1. Follow-up with neurology as an outpatient 2. follow-up with PCP as an outpatient  Discharge Instructions:  Follow with Primary MD Patient, No Pcp Per in 7 days   Get CBC/BMP checked in next visit within 1 week by PCP or SNF MD ( we routinely change or add medications that can affect your baseline labs and fluid status, therefore we recommend that you get the mentioned basic workup next visit with your PCP, your PCP may decide not to get them or add new tests based on their clinical decision)  On your next visit with your PCP, please Get Medicines reviewed and adjusted.  Please request your PCP  to go over all Hospital Tests and Procedure/Radiological results at the follow up, please get all Hospital records sent to your Prim MD by signing hospital release before you go home.  Activity: As tolerated with Full fall precautions use walker/cane & assistance as needed  For Heart failure patients -  Check your Weight same time everyday, if you gain over 2 pounds, or you develop in leg swelling, experience more shortness of breath or chest pain, call your Primary MD immediately. Follow Cardiac Low Salt Diet and 1.5 lit/day fluid restriction.  If you have smoked or chewed Tobacco in the last 2 yrs please stop smoking, stop any regular Alcohol  and or any Recreational drug use.  If you experience worsening of your admission symptoms, develop shortness of breath, life threatening emergency, suicidal or homicidal thoughts you must seek medical attention immediately by calling 911 or calling your MD immediately  if symptoms less severe.  You Must read complete instructions/literature along with all the possible adverse reactions/side effects for all the Medicines you take and that have been prescribed to you. Take any new Medicines after you have completely understood and accpet all the possible adverse reactions/side effects.   Do not drive, operate heavy machinery, perform activities at heights, swimming or participation in water activities or provide baby sitting services if your were admitted for syncope or siezures until you have seen by Primary MD or a Neurologist and advised to do so again.  Do not drive when taking Pain medications.  Do not take more than prescribed Pain, Sleep  and Anxiety Medications  Wear Seat belts while driving.   Please note You were cared for by a hospitalist during your hospital stay. If you have any questions about your discharge medications or the care you received while you were in the hospital after you are discharged, you can call the unit and asked to speak with the hospitalist on call if the hospitalist that took care of you is not available. Once you are discharged, your primary care physician will handle any further medical issues. Please note that NO REFILLS for any discharge medications will be authorized once you are discharged, as it is imperative that you  return to your primary care physician (or establish a relationship with a primary care physician if you do not have one) for your aftercare needs so that they can reassess your need for medications and monitor your lab values.    Allergies as of 08/05/2020   No Known Allergies     Medication List    STOP taking these medications   atenolol 50 MG tablet Commonly known as: TENORMIN     TAKE these medications   acetaminophen 325 MG tablet Commonly known as: TYLENOL Take 650 mg by mouth every 6 (six) hours as needed for moderate pain or headache.   albuterol (2.5 MG/3ML) 0.083% nebulizer solution Commonly known as: PROVENTIL Take 3 mLs (2.5 mg total) by nebulization every 6 (six) hours as needed for wheezing or shortness of breath. For breathing   aspirin 325 MG EC tablet Take 1 tablet (325 mg total) by mouth daily. Start taking on: August 06, 2020   atorvastatin 40 MG tablet Commonly known as: LIPITOR Take 1 tablet (40 mg total) by mouth every evening. For Cholesterol   carvedilol 3.125 MG tablet Commonly known as: COREG Take 1 tablet (3.125 mg total) by mouth 2 (two) times daily with a meal.   clopidogrel 75 MG tablet Commonly known as: PLAVIX Take 1 tablet (75 mg total) by mouth daily. Start taking on: August 07, 7627   folic acid 1 MG tablet Commonly known as: FOLVITE Take 1 tablet (1 mg total) by mouth daily.   multivitamin with minerals Tabs tablet Take 1 tablet by mouth daily. Start taking on: August 06, 2020   senna-docusate 8.6-50 MG tablet Commonly known as: Senokot-S Take 1 tablet by mouth at bedtime as needed for moderate constipation.   thiamine 100 MG tablet Take 1 tablet (100 mg total) by mouth daily. Start taking on: August 06, 2020   ZzzQuil 25 MG Caps Generic drug: diphenhydrAMINE HCl (Sleep) Take 25 mg by mouth at bedtime as needed (sleep).            Durable Medical Equipment  (From admission, onward)         Start     Ordered    08/05/20 1317  For home use only DME 3 n 1  Once        08/05/20 1316          Time coordinating discharge: 35 minutes  The results of significant diagnostics from this hospitalization (including imaging, microbiology, ancillary and laboratory) are listed below for reference.    Procedures and Diagnostic Studies:   CT Code Stroke CTA Head W/WO contrast  Result Date: 08/02/2020 CLINICAL DATA:  Neuro deficit EXAM: CT ANGIOGRAPHY HEAD AND NECK TECHNIQUE: Multidetector CT imaging of the head and neck was performed using the standard protocol during bolus administration of intravenous contrast. Multiplanar CT image reconstructions and MIPs were obtained to  evaluate the vascular anatomy. Carotid stenosis measurements (when applicable) are obtained utilizing NASCET criteria, using the distal internal carotid diameter as the denominator. CONTRAST:  137mL OMNIPAQUE IOHEXOL 350 MG/ML SOLN COMPARISON:  Same day noncontrast head CT and CT cerebral perfusion. 09/08/2019 CT neck. FINDINGS: CTA NECK FINDINGS Aortic arch: Standard branching. Imaged portion shows no evidence of aneurysm or dissection. No significant stenosis of the major arch vessel origins. Calcified atheromatous plaque. Right carotid system: No evidence of dissection, stenosis (50% or greater) or occlusion. Calcified atheromatous plaque at the bifurcation. Patent right stent spanning the distal common/proximal internal carotid arteries. Left carotid system: The proximal common left carotid artery demonstrates complete occlusion shortly after its takeoff to the level of the bifurcation where there is extensive calcified and noncalcified atheromatous plaque. There is a distal reconstitution of the left internal carotid and external carotid arteries. The left internal carotid artery demonstrates comparatively diminished opacification. Vertebral arteries: Dominant right vertebral artery. Diminutive left vertebral artery with V4 segment atherosclerotic  calcifications. No definite focal high-grade narrowing however diffuse vessel wall irregularity likely reflects atheromatous disease. No evidence of dissection, stenosis (50% or greater) or occlusion. Skeleton: Multilevel spondylosis with reversal of cervical lordosis. Grade 1 C2-3 and C7-T1 anterolisthesis. Grade 1 C5-6 and C6-7 retrolisthesis. No acute osseous abnormality. Other neck: No adenopathy.  No soft tissue mass. Upper chest: Emphysema.  Biapical atelectasis. Review of the MIP images confirms the above findings CTA HEAD FINDINGS Anterior circulation: Asymmetrically diminished opacification of the left internal carotid artery. Bilateral carotid siphon atherosclerotic calcifications, right greater than left. Distal left ICA cervical segment and petrous segment atherosclerotic calcifications. Diminutive left M1 segment. No aneurysm or vascular malformation. Posterior circulation: Dominant right vertebral artery. Dominant right PCA. Fetal origin of the left PCA. Mild-to-moderate irregularity involving the left P2 segment likely secondary to atheromatous disease. No aneurysm, or vascular malformation. Asymmetrically diminished opacification of the left parietooccipital cortical branches. Venous sinuses: No evidence of thrombosis. Anatomic variants: Bilateral PCOM hypoplasia. Review of the MIP images confirms the above findings IMPRESSION: 1. 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 Distal reconstitution of the left internal and external carotid arteries. 2. Asymmetrically diminished opacification of the left internal carotid artery to the terminus. 3. Mild to moderate narrowing and irregularity involving the left P2 segment, likely secondary to atheromatous disease. 4. Asymmetrically diminished opacification of left parietooccipital cortical branches. 5. Patent right common/proximal internal carotid artery stent. 6. Diminutive left vertebral  artery with V4 segment atherosclerotic calcifications. These results were called by telephone at the time of interpretation on 08/02/2020 at 2:46 pm to provider Dr. Erlinda Hong, Who verbally acknowledged these results. Electronically Signed   By: Primitivo Gauze M.D.   On: 08/02/2020 14:58   CT Code Stroke CTA Neck W/WO contrast  Result Date: 08/02/2020 CLINICAL DATA:  Neuro deficit EXAM: CT ANGIOGRAPHY HEAD AND NECK TECHNIQUE: Multidetector CT imaging of the head and neck was performed using the standard protocol during bolus administration of intravenous contrast. Multiplanar CT image reconstructions and MIPs were obtained to evaluate the vascular anatomy. Carotid stenosis measurements (when applicable) are obtained utilizing NASCET criteria, using the distal internal carotid diameter as the denominator. CONTRAST:  142mL OMNIPAQUE IOHEXOL 350 MG/ML SOLN COMPARISON:  Same day noncontrast head CT and CT cerebral perfusion. 09/08/2019 CT neck. FINDINGS: CTA NECK FINDINGS Aortic arch: Standard branching. Imaged portion shows no evidence of aneurysm or dissection. No significant stenosis of the major arch vessel origins.  Calcified atheromatous plaque. Right carotid system: No evidence of dissection, stenosis (50% or greater) or occlusion. Calcified atheromatous plaque at the bifurcation. Patent right stent spanning the distal common/proximal internal carotid arteries. Left carotid system: The proximal common left carotid artery demonstrates complete occlusion shortly after its takeoff to the level of the bifurcation where there is extensive calcified and noncalcified atheromatous plaque. There is a distal reconstitution of the left internal carotid and external carotid arteries. The left internal carotid artery demonstrates comparatively diminished opacification. Vertebral arteries: Dominant right vertebral artery. Diminutive left vertebral artery with V4 segment atherosclerotic calcifications. No definite focal high-grade  narrowing however diffuse vessel wall irregularity likely reflects atheromatous disease. No evidence of dissection, stenosis (50% or greater) or occlusion. Skeleton: Multilevel spondylosis with reversal of cervical lordosis. Grade 1 C2-3 and C7-T1 anterolisthesis. Grade 1 C5-6 and C6-7 retrolisthesis. No acute osseous abnormality. Other neck: No adenopathy.  No soft tissue mass. Upper chest: Emphysema.  Biapical atelectasis. Review of the MIP images confirms the above findings CTA HEAD FINDINGS Anterior circulation: Asymmetrically diminished opacification of the left internal carotid artery. Bilateral carotid siphon atherosclerotic calcifications, right greater than left. Distal left ICA cervical segment and petrous segment atherosclerotic calcifications. Diminutive left M1 segment. No aneurysm or vascular malformation. Posterior circulation: Dominant right vertebral artery. Dominant right PCA. Fetal origin of the left PCA. Mild-to-moderate irregularity involving the left P2 segment likely secondary to atheromatous disease. No aneurysm, or vascular malformation. Asymmetrically diminished opacification of the left parietooccipital cortical branches. Venous sinuses: No evidence of thrombosis. Anatomic variants: Bilateral PCOM hypoplasia. Review of the MIP images confirms the above findings IMPRESSION: 1. 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 Distal reconstitution of the left internal and external carotid arteries. 2. Asymmetrically diminished opacification of the left internal carotid artery to the terminus. 3. Mild to moderate narrowing and irregularity involving the left P2 segment, likely secondary to atheromatous disease. 4. Asymmetrically diminished opacification of left parietooccipital cortical branches. 5. Patent right common/proximal internal carotid artery stent. 6. Diminutive left vertebral artery with V4 segment atherosclerotic  calcifications. These results were called by telephone at the time of interpretation on 08/02/2020 at 2:46 pm to provider Dr. Erlinda Hong, Who verbally acknowledged these results. Electronically Signed   By: Primitivo Gauze M.D.   On: 08/02/2020 14:58   MR BRAIN WO CONTRAST  Result Date: 08/02/2020 CLINICAL DATA:  Stroke, follow-up EXAM: MRI HEAD WITHOUT CONTRAST TECHNIQUE: Multiplanar, multiecho pulse sequences of the brain and surrounding structures were obtained without intravenous contrast. COMPARISON:  Correlation made with prior CT imaging FINDINGS: Brain: There is reduced diffusion in the left occipital lobe extending into the temporal and parietal lobes. There is also patchy reduced diffusion in the left frontal lobe. A small focus is present in the posterior left thalamus. No significant mass effect. There is minimal susceptibility hypointensity primarily in the left parietooccipital lobes likely reflecting petechial hemorrhage. Chronic infarct centered within the right parietal lobe. Additional chronic infarct of the left basal ganglia and adjacent white matter. Superimposed patchy and confluent areas of T2 hyperintensity in the supratentorial and pontine white matter are nonspecific but probably reflect moderate chronic microvascular ischemic changes. Prominence of the ventricles and sulci reflects generalized parenchymal volume loss. Vascular: Major vessel flow voids at the skull base are preserved. Skull and upper cervical spine: Normal marrow signal is preserved. Sinuses/Orbits: Paranasal sinuses are aerated. Orbits are unremarkable. Other: Sella is unremarkable.  Mastoid air cells are  clear. IMPRESSION: Acute infarcts involving left posterior greater than anterior circulations, noting presence of a fetal left PCA on the prior CTA. No significant mass effect. There is minor petechial hemorrhage. Moderate chronic microvascular ischemic changes.  Chronic infarcts. Electronically Signed   By: Macy Mis  M.D.   On: 08/02/2020 16:52   IR Angiogram Extremity Left  Result Date: 08/05/2020 CLINICAL DATA:  New onset of right visual deficit with right-sided hemiparesis. Abnormal CT angiogram of the head and neck. EXAM: BILATERAL COMMON CAROTID AND INNOMINATE ANGIOGRAPHY COMPARISON:  Diagnostic catheter arteriogram of September 24, 2019 and CT angiogram the head and neck of August 02, 2020. MEDICATIONS: Heparin 2000 units IA. No antibiotic was administered within 1 hour of the procedure. ANESTHESIA/SEDATION: Versed 1 mg IV; Fentanyl 25 mcg IV Moderate Sedation Time:  43 minutes The patient was continuously monitored during the procedure by the interventional radiology nurse under my direct supervision. CONTRAST:  Isovue 300 approximately 65 mL FLUOROSCOPY TIME:  Fluoroscopy Time: 11 minutes 30 seconds (601 mGy). COMPLICATIONS: None immediate. TECHNIQUE: Informed written consent was obtained from the patient after a thorough discussion of the procedural risks, benefits and alternatives. All questions were addressed. Maximal Sterile Barrier Technique was utilized including caps, mask, sterile gowns, sterile gloves, sterile drape, hand hygiene and skin antiseptic. A timeout was performed prior to the initiation of the procedure. The right forearm to the wrist was prepped and draped in the usual sterile manner. The radial artery was identified with ultrasound, and its morphology documented. A dorsal palmar anastomosis was verified to be present. Using a micropuncture set, right radial access was obtained over a 0.018 inch micro guidewire. Over this, a 4/5 French radial sheath was inserted. The micro guidewire, and the obturator were removed. Good aspiration was obtained from the side port of the sheath. A cocktail of 2000 units of heparin, 2.5 mg of verapamil, and 200 mcg of nitroglycerin was then infused without event. A right radial arteriogram was obtained. Over a 0.035 inch Roadrunner guidewire, a 5 [place] Simmons 2  diagnostic catheter was advanced to the aortic arch region, and selectively positioned in the right vertebral artery, the right common carotid artery, the left common carotid artery and the left subclavian artery. Following the procedure, hemostasis at the right radial puncture site was achieved with a wrist band. Distal right radial pulse was verified to be present. FINDINGS: The right vertebral artery origin is widely patent. The vessel is seen to opacify to the cranial skull base. Wide patency is seen of the right posterior-inferior cerebellar artery and the right vertebrobasilar junction. There is a mild to moderate stenosis of the proximal basilar artery. More distally the basilar artery, the right posterior cerebral artery, the superior cerebellar arteries and the anterior-inferior cerebellar arteries opacify into the capillary and venous phases. 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 Also noted is retrograde opacification of the left vertebrobasilar junction and more proximally of the distal left vertebral artery. Subsequent retrograde opacification via the left occipital artery of the left external carotid artery and the left internal carotid artery. The left cavernous and supraclinoid segments and subsequently partial opacification of the left middle cerebral artery is seen. Flash filling of the left posterior communicating artery proximally is evident. Also demonstrated is a proximal 25-50% stenosis of the non dominant left vertebrobasilar junction just distal to the origin of the left posterior-inferior cerebellar artery. Both the posterior-inferior cerebellar arteries demonstrate focal areas of caliber irregularity  with narrowing likely representative of intracranial arteriosclerosis. The left common carotid arteriogram demonstrates complete angiographic occlusion of the left common carotid artery in its mid to its distal third associated with  significant arteriosclerotic calcification. No distal reconstitution of the left internal carotid artery or of the left external carotid artery is seen. The right common carotid arteriogram demonstrates wide patency of the right common carotid artery proximally and also in the proximal stented segment. The right internal carotid artery proximally within the stented segment and distally is widely patent. Patency is maintained of the petrous, the cavernous and the supraclinoid segments with mild arteriosclerotic narrowing of the horizontal segment of the petrous portion. The right middle and the right anterior cerebral artery opacify into the capillary and venous phases. Prompt cross-filling via the anterior communicating artery of the left anterior cerebral artery A1 and A2 segments, and also the left middle cerebral artery is seen. The superior division of the left middle cerebral artery demonstrates hypoattenuation in its proximal segment which may represent unopacified blood filling retrogradely from the left internal carotid artery as described above versus nonobstructive arteriosclerotic disease. Overall caliber of the left middle cerebral artery appears narrower than its counter part on the right side. The left subclavian arteriogram demonstrates patency of the thyrocervical trunk branches. There is suggestion of partial reconstitution of the left vertebral artery from collaterals arising from the ascending cervical branch of the thyrocervical trunk at the level of C3-C4. 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 Retrograde opacification of the left external carotid artery and the left internal carotid artery from the posterior circulation from the right vertebral artery and retrogradely the left vertebral artery via the left occipital artery. Occluded right external carotid artery at its origin. Cross-filling of the left anterior cerebral artery and the left  middle cerebral artery from the right ICA via the anterior communicating artery, and partially from the posterior circulation as described above. Occluded left posterior cerebral artery just distal to its origin. PLAN: Findings reviewed with the patient and referring neurologist. Follow-up ultrasound of the carotids in 6 months. Electronically Signed   By: Luanne Bras M.D.   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 Abnormal CT angiogram of the head and neck. EXAM: BILATERAL COMMON CAROTID AND INNOMINATE ANGIOGRAPHY COMPARISON:  Diagnostic catheter arteriogram of September 24, 2019 and CT angiogram the head and neck of August 02, 2020. MEDICATIONS: Heparin 2000 units IA. No antibiotic was administered within 1 hour of the procedure. ANESTHESIA/SEDATION: Versed 1 mg IV; Fentanyl 25 mcg IV Moderate Sedation Time:  43 minutes The patient was continuously monitored during the procedure by the interventional radiology nurse under my direct supervision. CONTRAST:  Isovue 300 approximately 65 mL FLUOROSCOPY TIME:  Fluoroscopy Time: 11 minutes 30 seconds (601 mGy). COMPLICATIONS: None immediate. TECHNIQUE: Informed written consent was obtained from the patient after a thorough discussion of the procedural risks, benefits and alternatives. All questions were addressed. Maximal Sterile Barrier Technique was utilized including caps, mask, sterile gowns, sterile gloves, sterile drape, hand hygiene and skin antiseptic. A timeout was performed prior to the initiation of the procedure. The right forearm to the wrist was prepped and draped in the usual sterile manner. The radial artery was identified with ultrasound, and its morphology documented. A dorsal palmar anastomosis was verified to be present. Using a micropuncture set, right radial access  was obtained over a 0.018 inch micro guidewire. Over this, a  4/5 French radial sheath was inserted. The micro guidewire, and the obturator were removed. Good aspiration was obtained from the side port of the sheath. A cocktail of 2000 units of heparin, 2.5 mg of verapamil, and 200 mcg of nitroglycerin was then infused without event. A right radial arteriogram was obtained. Over a 0.035 inch Roadrunner guidewire, a 5 [place] Simmons 2 diagnostic catheter was advanced to the aortic arch region, and selectively positioned in the right vertebral artery, the right common carotid artery, the left common carotid artery and the left subclavian artery. Following the procedure, hemostasis at the right radial puncture site was achieved with a wrist band. Distal right radial pulse was verified to be present. FINDINGS: The right vertebral artery origin is widely patent. The vessel is seen to opacify to the cranial skull base. Wide patency is seen of the right posterior-inferior cerebellar artery and the right vertebrobasilar junction. There is a mild to moderate stenosis of the proximal basilar artery. More distally the basilar artery, the right posterior cerebral artery, the superior cerebellar arteries and the anterior-inferior cerebellar arteries opacify into the capillary and venous phases. 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 Also noted is retrograde opacification of the left vertebrobasilar junction and more proximally of the distal left vertebral artery. Subsequent retrograde opacification via the left occipital artery of the left external carotid artery and the left internal carotid artery. The left cavernous and supraclinoid segments and subsequently partial opacification of the left middle cerebral artery is seen. Flash filling of the left posterior communicating artery proximally is evident. Also demonstrated is a proximal 25-50% stenosis of the non dominant left vertebrobasilar junction just distal to the origin of the  left posterior-inferior cerebellar artery. Both the posterior-inferior cerebellar arteries demonstrate focal areas of caliber irregularity with narrowing likely representative of intracranial arteriosclerosis. The left common carotid arteriogram demonstrates complete angiographic occlusion of the left common carotid artery in its mid to its distal third associated with significant arteriosclerotic calcification. No distal reconstitution of the left internal carotid artery or of the left external carotid artery is seen. The right common carotid arteriogram demonstrates wide patency of the right common carotid artery proximally and also in the proximal stented segment. The right internal carotid artery proximally within the stented segment and distally is widely patent. Patency is maintained of the petrous, the cavernous and the supraclinoid segments with mild arteriosclerotic narrowing of the horizontal segment of the petrous portion. The right middle and the right anterior cerebral artery opacify into the capillary and venous phases. Prompt cross-filling via the anterior communicating artery of the left anterior cerebral artery A1 and A2 segments, and also the left middle cerebral artery is seen. The superior division of the left middle cerebral artery demonstrates hypoattenuation in its proximal segment which may represent unopacified blood filling retrogradely from the left internal carotid artery as described above versus nonobstructive arteriosclerotic disease. Overall caliber of the left middle cerebral artery appears narrower than its counter part on the right side. The left subclavian arteriogram demonstrates patency of the thyrocervical trunk branches. There is suggestion of partial reconstitution of the left vertebral artery from collaterals arising from the ascending cervical branch of the thyrocervical trunk at the level of C3-C4. IMPRESSION: Angiographically occluded left common carotid artery in its mid  to distal 1/3. Retrograde opacification of the left external carotid artery and the left internal carotid artery  from the posterior circulation from the right vertebral artery and retrogradely the left vertebral artery via the left occipital artery. Occluded right external carotid artery at its origin. Cross-filling of the left anterior cerebral artery and the left middle cerebral artery from the right ICA via the anterior communicating artery, and partially from the posterior circulation as described above. Occluded left posterior cerebral artery just distal to its origin. PLAN: Findings reviewed with the patient and referring neurologist. Follow-up ultrasound of the carotids in 6 months. Electronically Signed   By: Luanne Bras M.D.   On: 08/04/2020 10:45   CT CEREBRAL PERFUSION W CONTRAST  Result Date: 08/02/2020 CLINICAL DATA:  Code stroke EXAM: CT PERFUSION BRAIN TECHNIQUE: Multiphase CT imaging of the brain was performed following IV bolus contrast injection. Subsequent parametric perfusion maps were calculated using RAPID software. CONTRAST:  149mL OMNIPAQUE IOHEXOL 350 MG/ML SOLN COMPARISON:  Same day noncontrast head CT.  09/07/2019 head CT. FINDINGS: CT Brain Perfusion Findings: CBF (<30%) Volume: 34mL Perfusion (Tmax>6.0s) volume: 29mL Mismatch Volume: 30mL ASPECTS on noncontrast CT Head: 10 at 1:50 p.m. Today. Infarct Core: 33 mL Infarction Location:Left parietooccipital region. IMPRESSION: 33 mL core infarct involving left parietooccipital region with surrounding ischemic penumbra of 45 mL. Electronically Signed   By: Primitivo Gauze M.D.   On: 08/02/2020 13:58   CT HEAD CODE STROKE WO CONTRAST  Result Date: 08/02/2020 CLINICAL DATA:  Code stroke.  Neuro deficit EXAM: CT HEAD WITHOUT CONTRAST TECHNIQUE: Contiguous axial images were obtained from the base of the skull through the vertex without intravenous contrast. COMPARISON:  09/07/2019 head CT. FINDINGS: Brain: Ill-defined hypodense  region involving the left parietooccipital region with blurring of the gray-white interface is concerning for acute infarct (5:55). Sequela of remote right MCA territory and left basal ganglia insults. Background scattered and confluent supratentorial white matter hypodensities are nonspecific however commonly associated with chronic microvascular ischemic changes. No mass lesion. No midline shift, ventriculomegaly or extra-axial fluid collection. Vascular: No hyperdense vessel. Bilateral skull base atherosclerotic calcifications. Skull: Negative for fracture or focal lesion. Sinuses/Orbits: Normal orbits. Clear paranasal sinuses. No mastoid effusion. Other: None. ASPECTS [facility] Stroke Program Early CT Score) - Ganglionic level infarction (caudate, lentiform nuclei, internal capsule, insula, M1-M3 cortex): 7 - Supraganglionic infarction (M4-M6 cortex): 3 Total score (0-10 with 10 being normal): 10 IMPRESSION: 1. Acute left parietooccipital infarct.  ASPECTS is 10. 2. Sequela of remote right MCA territory and left basal ganglia insults. 3. Chronic microvascular ischemic changes. Code stroke imaging results were communicated on 08/02/2020 at 1:51 pm to provider Dr. Curly Shores Via [employer] secure text paging. Electronically Signed   By: Primitivo Gauze M.D.   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 Abnormal CT angiogram of the head and neck. EXAM: BILATERAL COMMON CAROTID AND INNOMINATE ANGIOGRAPHY COMPARISON:  Diagnostic catheter arteriogram of September 24, 2019 and CT angiogram the head and neck of August 02, 2020. MEDICATIONS: Heparin 2000 units IA. No antibiotic was administered within 1 hour of the procedure. ANESTHESIA/SEDATION: Versed 1 mg IV; Fentanyl 25 mcg IV Moderate Sedation Time:  43 minutes The patient was continuously monitored during the procedure by  the interventional radiology nurse under my direct supervision. CONTRAST:  Isovue 300 approximately 65 mL FLUOROSCOPY TIME:  Fluoroscopy Time: 11 minutes 30 seconds (601 mGy). COMPLICATIONS: None immediate. TECHNIQUE: Informed written consent was obtained from the  patient after a thorough discussion of the procedural risks, benefits and alternatives. All questions were addressed. Maximal Sterile Barrier Technique was utilized including caps, mask, sterile gowns, sterile gloves, sterile drape, hand hygiene and skin antiseptic. A timeout was performed prior to the initiation of the procedure. The right forearm to the wrist was prepped and draped in the usual sterile manner. The radial artery was identified with ultrasound, and its morphology documented. A dorsal palmar anastomosis was verified to be present. Using a micropuncture set, right radial access was obtained over a 0.018 inch micro guidewire. Over this, a 4/5 French radial sheath was inserted. The micro guidewire, and the obturator were removed. Good aspiration was obtained from the side port of the sheath. A cocktail of 2000 units of heparin, 2.5 mg of verapamil, and 200 mcg of nitroglycerin was then infused without event. A right radial arteriogram was obtained. Over a 0.035 inch Roadrunner guidewire, a 5 [place] Simmons 2 diagnostic catheter was advanced to the aortic arch region, and selectively positioned in the right vertebral artery, the right common carotid artery, the left common carotid artery and the left subclavian artery. Following the procedure, hemostasis at the right radial puncture site was achieved with a wrist band. Distal right radial pulse was verified to be present. FINDINGS: The right vertebral artery origin is widely patent. The vessel is seen to opacify to the cranial skull base. Wide patency is seen of the right posterior-inferior cerebellar artery and the right vertebrobasilar junction. There is a mild to moderate stenosis of the  proximal basilar artery. More distally the basilar artery, the right posterior cerebral artery, the superior cerebellar arteries and the anterior-inferior cerebellar arteries opacify into the capillary and venous phases. 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 Also noted is retrograde opacification of the left vertebrobasilar junction and more proximally of the distal left vertebral artery. Subsequent retrograde opacification via the left occipital artery of the left external carotid artery and the left internal carotid artery. The left cavernous and supraclinoid segments and subsequently partial opacification of the left middle cerebral artery is seen. Flash filling of the left posterior communicating artery proximally is evident. Also demonstrated is a proximal 25-50% stenosis of the non dominant left vertebrobasilar junction just distal to the origin of the left posterior-inferior cerebellar artery. Both the posterior-inferior cerebellar arteries demonstrate focal areas of caliber irregularity with narrowing likely representative of intracranial arteriosclerosis. The left common carotid arteriogram demonstrates complete angiographic occlusion of the left common carotid artery in its mid to its distal third associated with significant arteriosclerotic calcification. No distal reconstitution of the left internal carotid artery or of the left external carotid artery is seen. The right common carotid arteriogram demonstrates wide patency of the right common carotid artery proximally and also in the proximal stented segment. The right internal carotid artery proximally within the stented segment and distally is widely patent. Patency is maintained of the petrous, the cavernous and the supraclinoid segments with mild arteriosclerotic narrowing of the horizontal segment of the petrous portion. The right middle and the right anterior cerebral artery opacify into the  capillary and venous phases. Prompt cross-filling via the anterior communicating artery of the left anterior cerebral artery A1 and A2 segments, and also the left middle cerebral artery is seen. The superior division of the left middle cerebral artery demonstrates hypoattenuation in its proximal segment which may represent unopacified blood filling retrogradely from the left internal carotid artery as described above  versus nonobstructive arteriosclerotic disease. Overall caliber of the left middle cerebral artery appears narrower than its counter part on the right side. The left subclavian arteriogram demonstrates patency of the thyrocervical trunk branches. There is suggestion of partial reconstitution of the left vertebral artery from collaterals arising from the ascending cervical branch of the thyrocervical trunk at the level of C3-C4. 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 Retrograde opacification of the left external carotid artery and the left internal carotid artery from the posterior circulation from the right vertebral artery and retrogradely the left vertebral artery via the left occipital artery. Occluded right external carotid artery at its origin. Cross-filling of the left anterior cerebral artery and the left middle cerebral artery from the right ICA via the anterior communicating artery, and partially from the posterior circulation as described above. Occluded left posterior cerebral artery just distal to its origin. PLAN: Findings reviewed with the patient and referring neurologist. Follow-up ultrasound of the carotids in 6 months. Electronically Signed   By: Luanne Bras M.D.   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 Abnormal CT angiogram of the head and neck. EXAM: BILATERAL COMMON CAROTID AND  INNOMINATE ANGIOGRAPHY COMPARISON:  Diagnostic catheter arteriogram of September 24, 2019 and CT angiogram the head and neck of August 02, 2020. MEDICATIONS: Heparin 2000 units IA. No antibiotic was administered within 1 hour of the procedure. ANESTHESIA/SEDATION: Versed 1 mg IV; Fentanyl 25 mcg IV Moderate Sedation Time:  43 minutes The patient was continuously monitored during the procedure by the interventional radiology nurse under my direct supervision. CONTRAST:  Isovue 300 approximately 65 mL FLUOROSCOPY TIME:  Fluoroscopy Time: 11 minutes 30 seconds (601 mGy). COMPLICATIONS: None immediate. TECHNIQUE: Informed written consent was obtained from the patient after a thorough discussion of the procedural risks, benefits and alternatives. All questions were addressed. Maximal Sterile Barrier Technique was utilized including caps, mask, sterile gowns, sterile gloves, sterile drape, hand hygiene and skin antiseptic. A timeout was performed prior to the initiation of the procedure. The right forearm to the wrist was prepped and draped in the usual sterile manner. The radial artery was identified with ultrasound, and its morphology documented. A dorsal palmar anastomosis was verified to be present. Using a micropuncture set, right radial access was obtained over a 0.018 inch micro guidewire. Over this, a 4/5 French radial sheath was inserted. The micro guidewire, and the obturator were removed. Good aspiration was obtained from the side port of the sheath. A cocktail of 2000 units of heparin, 2.5 mg of verapamil, and 200 mcg of nitroglycerin was then infused without event. A right radial arteriogram was obtained. Over a 0.035 inch Roadrunner guidewire, a 5 [place] Simmons 2 diagnostic catheter was advanced to the aortic arch region, and selectively positioned in the right vertebral artery, the right common carotid artery, the left common carotid artery and the left subclavian artery. Following the procedure, hemostasis  at the right radial puncture site was achieved with a wrist band. Distal right radial pulse was verified to be present. FINDINGS: The right vertebral artery origin is widely patent. The vessel is seen to opacify to the cranial skull base. Wide patency is seen of the right posterior-inferior cerebellar artery and the right vertebrobasilar junction. There is a mild to moderate stenosis of the proximal basilar artery. More distally the basilar artery,  the right posterior cerebral artery, the superior cerebellar arteries and the anterior-inferior cerebellar arteries opacify into the capillary and venous phases. 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 Also noted is retrograde opacification of the left vertebrobasilar junction and more proximally of the distal left vertebral artery. Subsequent retrograde opacification via the left occipital artery of the left external carotid artery and the left internal carotid artery. The left cavernous and supraclinoid segments and subsequently partial opacification of the left middle cerebral artery is seen. Flash filling of the left posterior communicating artery proximally is evident. Also demonstrated is a proximal 25-50% stenosis of the non dominant left vertebrobasilar junction just distal to the origin of the left posterior-inferior cerebellar artery. Both the posterior-inferior cerebellar arteries demonstrate focal areas of caliber irregularity with narrowing likely representative of intracranial arteriosclerosis. The left common carotid arteriogram demonstrates complete angiographic occlusion of the left common carotid artery in its mid to its distal third associated with significant arteriosclerotic calcification. No distal reconstitution of the left internal carotid artery or of the left external carotid artery is seen. The right common carotid arteriogram demonstrates wide patency of the right common carotid artery  proximally and also in the proximal stented segment. The right internal carotid artery proximally within the stented segment and distally is widely patent. Patency is maintained of the petrous, the cavernous and the supraclinoid segments with mild arteriosclerotic narrowing of the horizontal segment of the petrous portion. The right middle and the right anterior cerebral artery opacify into the capillary and venous phases. Prompt cross-filling via the anterior communicating artery of the left anterior cerebral artery A1 and A2 segments, and also the left middle cerebral artery is seen. The superior division of the left middle cerebral artery demonstrates hypoattenuation in its proximal segment which may represent unopacified blood filling retrogradely from the left internal carotid artery as described above versus nonobstructive arteriosclerotic disease. Overall caliber of the left middle cerebral artery appears narrower than its counter part on the right side. The left subclavian arteriogram demonstrates patency of the thyrocervical trunk branches. There is suggestion of partial reconstitution of the left vertebral artery from collaterals arising from the ascending cervical branch of the thyrocervical trunk at the level of C3-C4. 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 Retrograde opacification of the left external carotid artery and the left internal carotid artery from the posterior circulation from the right vertebral artery and retrogradely the left vertebral artery via the left occipital artery. Occluded right external carotid artery at its origin. Cross-filling of the left anterior cerebral artery and the left middle cerebral artery from the right ICA via the anterior communicating artery, and partially from the posterior circulation as described above. Occluded left posterior cerebral artery just distal to its origin. PLAN: Findings reviewed with the patient  and referring neurologist. Follow-up ultrasound of the carotids in 6 months. Electronically Signed   By: Luanne Bras M.D.   On: 08/04/2020 10:45     Labs:   Basic Metabolic Panel: Recent Labs  Lab 08/02/20 1320 08/02/20 1320 08/02/20 1325 08/02/20 1325 08/03/20 0624 08/04/20 0818 08/05/20 0141  NA 138  --  140  --   --  134* 136  K 4.0   < > 4.0   < >  --  3.7 3.6  CL 103  --  103  --   --  102 103  CO2 25  --   --   --   --  23 23  GLUCOSE 131*  --  131*  --   --  107* 103*  BUN 10  --  10  --   --  5* 9  CREATININE 0.64  --  0.50*  --   --  0.58* 0.72  CALCIUM 8.8*  --   --   --   --  8.4* 8.3*  MG  --   --   --   --  1.8  --   --    < > = values in this interval not displayed.   GFR Estimated Creatinine Clearance: 91.2 mL/min (by C-G formula based on SCr of 0.72 mg/dL). Liver Function Tests: Recent Labs  Lab 08/02/20 1320  AST 28  ALT 29  ALKPHOS 68  BILITOT 1.3*  PROT 7.3  ALBUMIN 3.4*   No results for input(s): LIPASE, AMYLASE in the last 168 hours. No results for input(s): AMMONIA in the last 168 hours. Coagulation profile Recent Labs  Lab 08/02/20 1320  INR 1.1    CBC: Recent Labs  Lab 08/02/20 1320 08/02/20 1325  WBC 8.1  --   NEUTROABS 6.3  --   HGB 11.0* 12.6*  HCT 39.5 37.0*  MCV 73.3*  --   PLT 291  --    Cardiac Enzymes: No results for input(s): CKTOTAL, CKMB, CKMBINDEX, TROPONINI in the last 168 hours. BNP: Invalid input(s): POCBNP CBG: Recent Labs  Lab 08/02/20 1319  GLUCAP 126*   D-Dimer No results for input(s): DDIMER in the last 72 hours. Hgb A1c Recent Labs    08/03/20 0624  HGBA1C 6.0*   Lipid Profile Recent Labs    08/03/20 0624  CHOL 102  HDL 35*  LDLCALC 47  TRIG 98  CHOLHDL 2.9   Thyroid function studies No results for input(s): TSH, T4TOTAL, T3FREE, THYROIDAB in the last 72 hours.  Invalid input(s): FREET3 Anemia work up No results for input(s): VITAMINB12, FOLATE, FERRITIN, TIBC, IRON,  RETICCTPCT in the last 72 hours. Microbiology Recent Results (from the past 240 hour(s))  SARS Coronavirus 2 by RT PCR (hospital order, performed in [facility] hospital lab) Nasopharyngeal Nasopharyngeal Swab     Status: None   Collection Time: 08/02/20  3:08 PM   Specimen: Nasopharyngeal Swab  Result Value Ref Range Status   SARS Coronavirus 2 NEGATIVE NEGATIVE Final    Comment: (NOTE) SARS-CoV-2 target nucleic acids are NOT DETECTED.  The SARS-CoV-2 RNA is generally detectable in upper and lower respiratory specimens during the acute phase of infection. The lowest concentration of SARS-CoV-2 viral copies this assay can detect is 250 copies / mL. A negative result does not preclude SARS-CoV-2 infection and should not be used as the sole basis for treatment or other patient management decisions.  A negative result may occur with improper specimen collection / handling, submission of specimen other than nasopharyngeal swab, presence of viral mutation(s) within the areas targeted by this assay, and inadequate number of viral copies (<250 copies / mL). A negative result must be combined with clinical observations, patient history, and epidemiological information.  Fact Sheet for Patients:   StrictlyIdeas.no  Fact Sheet for Healthcare Providers: BankingDealers.co.za  This test is not yet approved or  cleared by the [place] FDA and has been authorized for detection and/or diagnosis of SARS-CoV-2 by FDA under an Emergency Use Authorization (EUA).  This EUA will remain in effect (meaning this test can be used) for the duration of the COVID-19 declaration under Section 564(b)(1) of the Act, 21  U.S.C. section 360bbb-3(b)(1), unless the authorization is terminated or revoked sooner.  Performed at [facility], [place] [address] [place], [place] [address]      Signed: Terrilee Croak  [facility] 08/05/2020, 1:52  PM

## 2020-08-05 NOTE — IPOC Note (Signed)
Individualized overall Plan of Care [facility]) Patient Details Name: Nathan Hart MRN: [id number] DOB: [birth year]  Admitting Diagnosis: Left middle cerebral artery stroke [facility])  Hospital Problems: Principal Problem:   Left middle cerebral artery stroke [facility]) Active Problems:   Anemia   Dyslipidemia   Right hemiparesis (HCC)   Dementia without behavioral disturbance ([place])     Functional Problem List: Nursing Behavior, Endurance, Medication Management  PT  (vision)  OT Balance, Perception, Safety, Cognition, Edema, Vision, Endurance, Motor, Pain  SLP Cognition, Safety  TR         Basic ADL's: OT Grooming, Bathing, Dressing, Toileting     Advanced  ADL's: OT Simple Meal Preparation     Transfers: PT Bed to Chair, Car, Furniture, Floor  OT Toilet, Tub/Shower     Locomotion: PT Ambulation, Stairs     Additional Impairments: OT None, Fuctional Use of Upper Extremity  SLP Communication, Social Cognition comprehension Social Interaction, Problem Solving, Memory, Awareness, Attention  TR      Anticipated Outcomes Item Anticipated Outcome  Self Feeding Min A  Swallowing      Basic self-care  CGA-Min A  Toileting  Min A   Bathroom Transfers Supervision  Bowel/Bladder  patient to remain continent of bowel and bladder.  Transfers  supervision  Locomotion  supervision  Communication  supervision A expression, min A comprehension basic to mild complex  Cognition  supervision A safety/basic problem solving, min-mod A complex problem solving  Pain  Pain to remain < 2  Safety/Judgment  Patient to be aware of safety goals and unsafe risk.   Therapy Plan: PT Intensity: Minimum of 1-2 x/day ,45 to 90 minutes PT Frequency: 5 out of 7 days PT Duration Estimated Length of Stay: 7-10 OT Intensity: Minimum of 1-2 x/day, 45 to 90 minutes OT Frequency: 5 out of 7 days OT Duration/Estimated Length of Stay: 8-10 days SLP Intensity: Minumum of 1-2 x/day, 30 to 90  minutes SLP Frequency: 3 to 5 out of 7 days SLP Duration/Estimated Length of Stay: 7-10 days    Team Interventions: Nursing Interventions Patient/Family Education  PT interventions Ambulation/gait training, Pain management, Stair training, Training and development officer, DME/adaptive equipment instruction, Patient/family education, Therapeutic Activities, Wheelchair propulsion/positioning, Cognitive remediation/compensation, Therapeutic Exercise, Psychosocial support, Community reintegration, Functional mobility training, UE/LE Strength taining/ROM, Discharge planning, Neuromuscular re-education, UE/LE Coordination activities  OT Interventions Balance/vestibular training, Discharge planning, Functional electrical stimulation, Self Care/advanced ADL retraining, Pain management, Therapeutic Activities, UE/LE Coordination activities, Visual/perceptual remediation/compensation, Therapeutic Exercise, Patient/family education, Functional mobility training, Disease mangement/prevention, Cognitive remediation/compensation, Community reintegration, Engineer, drilling, Neuromuscular re-education, Psychosocial support, UE/LE Strength taining/ROM, Splinting/orthotics  SLP Interventions Cognitive remediation/compensation, Speech/Language facilitation, Environmental controls, Patient/family education, Functional tasks, Cueing hierarchy  TR Interventions    SW/CM Interventions Discharge Planning, Psychosocial Support, Patient/Family Education   Barriers to Discharge MD  Medical stability and Dementia  Nursing Medication compliance    PT Other (comments) cognitive changes  OT Other (comments) (n/a)    SLP Other (comments) N/A  SW       Team Discharge Planning: Destination: PT-Home ,OT- Home , SLP-Home Projected Follow-up: PT-Home health PT, Outpatient PT (TBD), OT-  Home health OT, SLP-Other (comment) (TBD) Projected Equipment Needs: PT-To be determined, OT- To be determined, SLP-None  recommended by SLP Equipment Details: PT-has none per son Joe, OT-  Patient/family involved in discharge planning: PT- Patient, Family member/caregiver,  OT-Patient, SLP-Patient unable/family or caregive not available  MD ELOS: 7-10 days. Medical Rehab Prognosis:  Good Assessment: 64 year old right-handed male  with history of hypertension, CVA, right carotid stenting September 2020, COPD with home oxygen, legally blind bilaterally, smoker with alcohol use as well as questionable medical compliance.  Presented 08/02/2020 with right side weakness numbness and hemiparesis.  Cranial CT scan showed acute left parieto-occipital sequela of remote right MCA territory left basal ganglia insult.  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  Right ICA stent was patent.  Patient did not receive TPA.  MRI acute infarct left posterior greater than anterior circulation is noted presence of a fetal left PCA on the prior CTA no significant mass-effect.  There was a minor petechial hemorrhage.  Admission chemistries glucose 131 hemoglobin 11 alcohol negative urine drug screen negative urinalysis negative nitrite.  Currently maintained on aspirin Plavix for CVA prophylaxis x3 months then aspirin alone. Patient with resulting functional deficits with mobility, tranfers, self-care.  Will set goals for Supervision/Min A with PT/OT/SLP.   Due to the current state of emergency, patients may not be receiving their 3-hours of Medicare-mandated therapy.  See Team Conference Notes for weekly updates to the plan of care

## 2020-08-05 NOTE — Progress Notes (Signed)
Report given to 4W RN.

## 2020-08-05 NOTE — H&P (Signed)
Physical Medicine and Rehabilitation Admission H&P    No chief complaint on file. : HPI: Nathan Hart is a 64 year old right-handed male with history of hypertension, CVA, right carotid stenting September 2020, COPD with home oxygen, legally blind bilaterally, smoker with alcohol use as well as questionable medical compliance.  History taken from chart review and patient.  Patient lives with son and family.  Presented 08/02/2020 with right side weakness numbness and hemiparesis.  Cranial CT scan showed acute left parieto-occipital sequela of remote right MCA territory left basal ganglia insult.  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  Right ICA stent was patent.  Patient did not receive TPA.  MRI acute infarct left posterior greater than anterior circulation is noted presence of a fetal left PCA on the prior CTA no significant mass-effect.  There was a minor petechial hemorrhage.  Admission chemistries glucose 131 hemoglobin 11 alcohol negative urine drug screen negative urinalysis negative nitrite.  Currently maintained on aspirin Plavix for CVA prophylaxis x3 months then aspirin alone.  Subcutaneous Lovenox for DVT prophylaxis   Tolerating a regular diet.  Therapy evaluations completed and patient was admitted for a comprehensive rehab program.   Hard to get ROS due to pt's [place] mental status exan being 9/24 (couldn't do visual tasks)  Review of Systems  Unable to perform ROS: Dementia  Constitutional: Positive for malaise/fatigue.  Eyes:       Legally blind  Respiratory: Negative for cough.        Shortness of breath with exertion  Cardiovascular: Negative for chest pain, palpitations and leg swelling.  Gastrointestinal: Positive for constipation. Negative for heartburn, nausea and vomiting.  Genitourinary: Negative for dysuria, flank pain and hematuria.  Musculoskeletal: Positive for  myalgias.  Neurological: Positive for sensory change and weakness.  All other systems reviewed and are negative.  Past Medical History:  Diagnosis Date  . Abnormal peripheral vision of left eye   . Blind right eye   . COPD (chronic obstructive pulmonary disease) (HCC)     home o2 hs and prn  . Dyspnea    with activity  . Empyema lung ([place])   . ETOH abuse   . HTN (hypertension)   . Peripheral vascular disease ([place])   . Pneumonia 2020  . Stroke [facility])    Past Surgical History:  Procedure Laterality Date  . BACK SURGERY     disc lumar x 2   . CLAVICLE SURGERY Right   . IR ANGIO INTRA EXTRACRAN SEL COM CAROTID INNOMINATE BILAT MOD SED  09/12/2019  . IR ANGIO INTRA EXTRACRAN SEL COM CAROTID INNOMINATE BILAT MOD SED  08/03/2020  . IR ANGIO VERTEBRAL SEL SUBCLAVIAN INNOMINATE UNI L MOD SED  09/12/2019  . IR ANGIO VERTEBRAL SEL VERTEBRAL UNI R MOD SED  09/12/2019  . IR ANGIO VERTEBRAL SEL VERTEBRAL UNI R MOD SED  08/03/2020  . IR ANGIOGRAM EXTREMITY LEFT  08/03/2020  . IR INTRAVSC STENT CERV CAROTID W/EMB-PROT MOD SED INCL ANGIO  09/24/2019  . IR US GUIDE VASC ACCESS RIGHT  09/12/2019  . IR US GUIDE VASC ACCESS RIGHT  08/03/2020  . KNEE SURGERY Right     x 3 ACL  . RADIOLOGY WITH ANESTHESIA N/A 09/24/2019   Procedure: IR WITH ANESTHESIA/STENTING;  Surgeon: Luanne Bras, MD;  Location: [facility];  Service: Radiology;  Laterality: N/A;  . TALC PLEURODESIS  [place], [place]   Family History  Problem Relation Age of Onset  . Cancer Mother    Social History:  reports that he has been smoking cigarettes. He has a 13.50 pack-year smoking history. He has never used smokeless tobacco. He reports current alcohol use of about 20.0 standard drinks of alcohol per week. He reports that he does not use drugs. Allergies: No Known Allergies Medications Prior to Admission  Medication Sig Dispense Refill  . acetaminophen (TYLENOL) 325 MG tablet Take 650 mg by mouth every 6 (six) hours as needed for  moderate pain or headache.    . albuterol (PROVENTIL) (2.5 MG/3ML) 0.083% nebulizer solution Take 3 mLs (2.5 mg total) by nebulization every 6 (six) hours as needed for wheezing or shortness of breath. For breathing 75 mL 12  . [START ON 08/06/2020] aspirin EC 325 MG EC tablet Take 1 tablet (325 mg total) by mouth daily. 30 tablet 0  . atorvastatin (LIPITOR) 40 MG tablet Take 1 tablet (40 mg total) by mouth every evening. For Cholesterol 30 tablet 5  . carvedilol (COREG) 3.125 MG tablet Take 1 tablet (3.125 mg total) by mouth 2 (two) times daily with a meal.    . [START ON 08/06/2020] clopidogrel (PLAVIX) 75 MG tablet Take 1 tablet (75 mg total) by mouth daily.    . diphenhydrAMINE HCl, Sleep, (ZZZQUIL) 25 MG CAPS Take 25 mg by mouth at bedtime as needed (sleep).    . folic acid (FOLVITE) 1 MG tablet Take 1 tablet (1 mg total) by mouth daily. 30 tablet 5  . [START ON 08/06/2020] Multiple Vitamin (MULTIVITAMIN WITH MINERALS) TABS tablet Take 1 tablet by mouth daily.    Marland Kitchen senna-docusate (SENOKOT-S) 8.6-50 MG tablet Take 1 tablet by mouth at bedtime as needed for moderate constipation.    Derrill Memo ON 08/06/2020] thiamine 100 MG tablet Take 1 tablet (100 mg total) by mouth daily.      Drug Regimen Review Drug regimen was reviewed and remains appropriate with no significant issues identified  Home: Home Living Family/patient expects to be discharged to:: Private residence Living Arrangements: Other (Comment) (Lives with son and daughter in law)   Functional History:    Functional Status:  Mobility:          ADL:    Cognition: Cognition Orientation Level: Oriented to person, Oriented to place    Physical Exam: Blood pressure (!) 141/82, pulse 91. Physical Exam Vitals and nursing note reviewed.  Constitutional:      Comments: Pt supine in bed- staring into space- answered some questions, but couldn't answer if felt constipated, (LBM 8/10) or is voiding normally, no one else in room,  NAD  HENT:     Head: Normocephalic and atraumatic.     Comments: Smile equal- tongue midline    Right Ear: External ear normal.     Left Ear: External ear normal.     Nose: Nose normal.     Mouth/Throat:     Mouth: Mucous membranes are dry.     Pharynx: Oropharynx is clear. No oropharyngeal exudate.  Eyes:     Comments: Pupils pinpoint and B/L/equal; said was able to see a little out of R eye- very blurry, but unable to track to follow my finger at all, and grossly could look left, but not to Right of midline with either eye- no nystagmus seen  Cardiovascular:     Comments: RRR_ no M/R/G Pulmonary:     Comments: CTA B/L- no W/R/R- good air  movement Little cough- sounds upper airway, dry Abdominal:     Comments: Soft, NT, ND, (+)BS normoactive  Musculoskeletal:     Comments: LUE 5/5 in deltoid, biceps, triceps, WR< grip and finger abd RUE- 4/4 in deltoid, bicep and tricep/WE; 3+/5 grip and 1/5 finger abd LLE- 5/5 in HF, KE, KF, DF and PF RLE- 4+/5 in HF, KE, KF, DF and PF-   Skin:    Comments: No skin breakdown seen on heels or arms/legs  Neurological:     Comments: Patient is alert and somewhat anxious.  Makes eye contact with examiner.  Provides his name and place but needed multiple cues for age and time.  He did follow commands but limited awareness of deficits.  Patient also limited medical historian. Poor awareness of deficits- said doesn't drink (per his son drinks 6 pack/day)- doesn't remember how he's voiding- Ox1-2- knows in hospital, but not why Says intact to light touch in all 4 extremities  Psychiatric:     Comments: Somewhat confused, not agitated     Results for orders placed or performed during the hospital encounter of 08/02/20 (from the past 48 hour(s))  Basic metabolic panel     Status: Abnormal   Collection Time: 08/04/20  8:18 AM  Result Value Ref Range   Sodium 134 (L) 135 - 145 mmol/L   Potassium 3.7 3.5 - 5.1 mmol/L   Chloride 102 98 - 111 mmol/L    CO2 23 22 - 32 mmol/L   Glucose, Bld 107 (H) 70 - 99 mg/dL    Comment: Glucose reference range applies only to samples taken after fasting for at least 8 hours.   BUN 5 (L) 8 - 23 mg/dL   Creatinine, Ser 0.58 (L) 0.61 - 1.24 mg/dL   Calcium 8.4 (L) 8.9 - 10.3 mg/dL   GFR calc non Af Amer >60 >60 mL/min   GFR calc Af Amer >60 >60 mL/min   Anion gap 9 5 - 15    Comment: Performed at [facility] [address], [place], [place] [address]  Basic metabolic panel     Status: Abnormal   Collection Time: 08/05/20  1:41 AM  Result Value Ref Range   Sodium 136 135 - 145 mmol/L   Potassium 3.6 3.5 - 5.1 mmol/L   Chloride 103 98 - 111 mmol/L   CO2 23 22 - 32 mmol/L   Glucose, Bld 103 (H) 70 - 99 mg/dL    Comment: Glucose reference range applies only to samples taken after fasting for at least 8 hours.   BUN 9 8 - 23 mg/dL   Creatinine, Ser 0.72 0.61 - 1.24 mg/dL   Calcium 8.3 (L) 8.9 - 10.3 mg/dL   GFR calc non Af Amer >60 >60 mL/min   GFR calc Af Amer >60 >60 mL/min   Anion gap 10 5 - 15    Comment: Performed at [facility] [address] [place], [place] [address]   ECHOCARDIOGRAM COMPLETE  Result Date: 08/04/2020    ECHOCARDIOGRAM REPORT   Patient Name:   GUSTAV KNUEPPEL [facility] Date of Exam: 08/04/2020 Medical Rec #:  [id number]          Height:       66.0 in Accession #:    [id number]         Weight:       170.0 lb Date of Birth:  07-24-1956          BSA:  1.866 m Patient Age:    38 years           BP:           154/84 mmHg Patient Gender: M                  HR:           88 bpm. Exam Location:  Inpatient Procedure: 2D Echo and Intracardiac Opacification Agent Indications:    Stroke 434.91 / I163.9  History:        Patient has prior history of Echocardiogram examinations, most                 recent 09/08/2019. Carotid Disease and COPD; Risk                 Factors:Hypertension, Dyslipidemia and Current Smoker.  Sonographer:    Darlina Sicilian RDCS Referring Phys: [id number]  CHRISTOPHER P DANFORD IMPRESSIONS  1. Left ventricular ejection fraction, by estimation, is 55 to 60%. The left ventricle has normal function. The left ventricle demonstrates regional wall motion abnormalities with possible basal inferior hypokinesis. There is mild left ventricular hypertrophy. Left ventricular diastolic parameters are consistent with Grade I diastolic dysfunction (impaired relaxation).  2. Right ventricular systolic function is normal. The right ventricular size is normal. Tricuspid regurgitation signal is inadequate for assessing PA pressure.  3. The mitral valve is normal in structure. No evidence of mitral valve regurgitation. No evidence of mitral stenosis.  4. The aortic valve is tricuspid. Aortic valve regurgitation is not visualized. No aortic stenosis is present.  5. The inferior vena cava is normal in size with greater than 50% respiratory variability, suggesting right atrial pressure of 3 mmHg.  6. Technically difficult study with poor acoustic windows. FINDINGS  Left Ventricle: Left ventricular ejection fraction, by estimation, is 55 to 60%. The left ventricle has normal function. The left ventricle demonstrates regional wall motion abnormalities. Definity contrast agent was given IV to delineate the left ventricular endocardial borders. The left ventricular internal cavity size was normal in size. There is mild left ventricular hypertrophy. Left ventricular diastolic parameters are consistent with Grade I diastolic dysfunction (impaired relaxation). Right Ventricle: The right ventricular size is normal. No increase in right ventricular wall thickness. Right ventricular systolic function is normal. Tricuspid regurgitation signal is inadequate for assessing PA pressure. Left Atrium: Left atrial size was normal in size. Right Atrium: Right atrial size was normal in size. Pericardium: There is no evidence of pericardial effusion. Mitral Valve: The mitral valve is normal in structure. There  is mild thickening of the mitral valve leaflet(s). There is moderate calcification of the mitral valve leaflet(s). No evidence of mitral valve regurgitation. No evidence of mitral valve stenosis. Tricuspid Valve: The tricuspid valve is normal in structure. Tricuspid valve regurgitation is not demonstrated. Aortic Valve: The aortic valve is tricuspid. Aortic valve regurgitation is not visualized. No aortic stenosis is present. Pulmonic Valve: The pulmonic valve was normal in structure. Pulmonic valve regurgitation is not visualized. Aorta: The aortic root is normal in size and structure. Venous: The inferior vena cava is normal in size with greater than 50% respiratory variability, suggesting right atrial pressure of 3 mmHg. IAS/Shunts: No atrial level shunt detected by color flow Doppler.  LEFT VENTRICLE PLAX 2D LVIDd:         3.80 cm  Diastology LVIDs:         2.90 cm  LV e' lateral:   6.22 cm/s LV PW:  1.10 cm  LV E/e' lateral: 8.3 LV IVS:        1.40 cm  LV e' medial:    6.57 cm/s LVOT diam:     2.10 cm  LV E/e' medial:  7.9 LV SV:         46 LV SV Index:   25 LVOT Area:     3.46 cm  LEFT ATRIUM             Index LA diam:        2.90 cm 1.55 cm/m LA Vol (A2C):   36.8 ml 19.72 ml/m LA Vol (A4C):   31.5 ml 16.88 ml/m LA Biplane Vol: 36.6 ml 19.61 ml/m  AORTIC VALVE LVOT Vmax:   76.90 cm/s LVOT Vmean:  49.100 cm/s LVOT VTI:    0.133 m  AORTA Ao Root diam: 3.20 cm MITRAL VALVE MV Area (PHT): 3.72 cm    SHUNTS MV Decel Time: 204 msec    Systemic VTI:  0.13 m MV E velocity: 51.90 cm/s  Systemic Diam: 2.10 cm MV A velocity: 86.80 cm/s MV E/A ratio:  0.60 Loralie Champagne MD Electronically signed by Loralie Champagne MD Signature Date/Time: 08/04/2020/4:48:24 PM    Final        Medical Problem List and Plan: 1.  Right-sided weakness secondary to left posterior MCA and left PCA infarct secondary to left CCA occlusion, large vessel disease source as well as history of right carotid stenting September  2020.  -patient may  shower  -ELOS/Goals: 10-14 days- goals min A 2.  Antithrombotics: -DVT/anticoagulation: Lovenox  -antiplatelet therapy: Aspirin 325 mg daily and Plavix 75 mg daily x3 months then aspirin alone 3. Pain Management: Tylenol as needed 4. Mood: Provide emotional support  -antipsychotic agents: N/A 5. Neuropsych: This patient is NOT (9/24 on mental status exam) capable of making decisions on his own behalf. 6. Skin/Wound Care: Routine skin checks 7. Fluids/Electrolytes/Nutrition: Routine in and outs with follow-up chemistries 8.  Hypertension.  Coreg 3.125 mg twice daily.  Monitor with increased mobility 9.  COPD.  Oxygen dependent.  Check oxygen saturations every shift 10.  Legally blind bilaterally.  Patient has assistance of son at home- due to macular degeneration 11.  History of tobacco alcohol use.  Provide counseling 12.  Hyperlipidemia.  Lipitor 13.  Questionable medical compliance.  Provide counseling with patient and family   Lavon Paganini. [place], PA-C 08/05/2020    I have personally performed a face to face diagnostic evaluation of this patient and formulated the key components of the plan.  Additionally, I have personally reviewed laboratory data, imaging studies, as well as relevant notes and concur with the physician assistant's documentation above.   The patient's status has not changed from the original H&P.  Any changes in documentation from the acute care chart have been noted above.     Courtney Heys, MD 08/05/2020

## 2020-08-05 NOTE — Progress Notes (Signed)
Inpatient Rehabilitation Admissions Coordinator  I contacted pt's son, [name], by phone to offer Cir admit today. He and family prefer outpatient therapy at this time. I have informed Dr. Pietro Cassis, acute team as well as TOC team of their preference. We will not pursue CIR admit today.  Danne Baxter, RN, MSN Rehab Admissions Coordinator [phone] 08/05/2020 11:56 AM

## 2020-08-05 NOTE — Progress Notes (Signed)
Nathan Arn, MD  Physician  Physical Medicine and Rehabilitation  Consult Note      Signed  Date of Service:  08/04/2020  2:23 PM      Related encounter: ED to Hosp-Admission (Discharged) from 08/02/2020 in [facility] [facility]      Signed      Expand All Collapse All  Show:Clear all [x] Manual[x] Template[] Copied  Added by: [x] Angiulli, Lavon Paganini, PA-C[x] Nathan Arn, MD  [] Hover for details          Physical Medicine and Rehabilitation Consult Reason for Consult: Right side numbness Referring Physician: [facility]     HPI: Nathan Hart is a 64 y.o. right-handed male with history of hypertension, CVA, right carotid stenting September 2020 COPD with home oxygen, legally blind bilaterally, smoker and alcohol use as well as questionable medical compliance. History taken from chart review and patient.  Patient lives with son and family.  He presented on 08/02/2020 with right side numbness and hemiparesis.  Cranial CT scan showed acute left parietal occipital sequela of remote right MCA territory left basal ganglia insult.  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  Right ICA stent was patent.  Patient did not receive TPA.  MRI acute infarct left posterior greater than anterior circulations noted presence of a fetal left PCA on the prior CTA no significant mass-effect.  There was minor petechial hemorrhage.  Admission chemistries glucose 131, hemoglobin 11, alcohol negative, urine drug screen negative, urinalysis negative nitrite.  Currently maintained on aspirin and Plavix for CVA prophylaxis.  Subcutaneous Lovenox for DVT prophylaxis.  Tolerating a regular diet.  Therapy evaluations completed with recommendations of physical medicine rehab consult.     Review of Systems  Constitutional: Positive for malaise/fatigue. Negative for chills and fever.  HENT: Negative for hearing  loss.   Eyes: Negative for blurred vision and double vision.       Patient is legally blind  Respiratory: Positive for shortness of breath.   Cardiovascular: Negative for chest pain, palpitations and leg swelling.  Gastrointestinal: Positive for constipation. Negative for heartburn, nausea and vomiting.  Genitourinary: Negative for dysuria, flank pain and hematuria.  Musculoskeletal: Positive for myalgias.  Skin: Negative for rash.  Neurological: Positive for sensory change, focal weakness and weakness.  All other systems reviewed and are negative.       Past Medical History:  Diagnosis Date  . Abnormal peripheral vision of left eye    . Blind right eye    . COPD (chronic obstructive pulmonary disease) (HCC)       home o2 hs and prn  . Dyspnea      with activity  . Empyema lung ([place])    . ETOH abuse    . HTN (hypertension)    . Peripheral vascular disease ([place])    . Pneumonia 2020  . Stroke [facility])           Past Surgical History:  Procedure Laterality Date  . BACK SURGERY        disc lumar x 2   . CLAVICLE SURGERY Right    . IR ANGIO INTRA EXTRACRAN SEL COM CAROTID INNOMINATE BILAT MOD SED   09/12/2019  . IR ANGIO VERTEBRAL SEL SUBCLAVIAN INNOMINATE UNI L MOD SED   09/12/2019  . IR ANGIO VERTEBRAL SEL VERTEBRAL UNI R MOD SED   09/12/2019  . IR INTRAVSC STENT CERV CAROTID W/EMB-PROT MOD  SED INCL ANGIO   09/24/2019  . IR US GUIDE VASC ACCESS RIGHT   09/12/2019  . KNEE SURGERY Right       x 3 ACL  . RADIOLOGY WITH ANESTHESIA N/A 09/24/2019    Procedure: IR WITH ANESTHESIA/STENTING;  Surgeon: Luanne Bras, MD;  Location: [facility];  Service: Radiology;  Laterality: N/A;  . [place], [place]         Family History  Problem Relation Age of Onset  . Cancer Mother      Social History:  reports that he has been smoking cigarettes. He has a 13.50 pack-year smoking history. He has never used smokeless tobacco. He reports current alcohol use of about 20.0  standard drinks of alcohol per week. He reports that he does not use drugs. Allergies: No Known Allergies       Medications Prior to Admission  Medication Sig Dispense Refill  . acetaminophen (TYLENOL) 325 MG tablet Take 650 mg by mouth every 6 (six) hours as needed for moderate pain or headache.      . albuterol (PROVENTIL) (2.5 MG/3ML) 0.083% nebulizer solution Take 3 mLs (2.5 mg total) by nebulization every 6 (six) hours as needed for wheezing or shortness of breath. For breathing 75 mL 12  . atenolol (TENORMIN) 50 MG tablet Take 1 tablet (50 mg total) by mouth daily. For BP 30 tablet 2  . atorvastatin (LIPITOR) 40 MG tablet Take 1 tablet (40 mg total) by mouth every evening. For Cholesterol 30 tablet 5  . diphenhydrAMINE HCl, Sleep, (ZZZQUIL) 25 MG CAPS Take 25 mg by mouth at bedtime as needed (sleep).      . folic acid (FOLVITE) 1 MG tablet Take 1 tablet (1 mg total) by mouth daily. 30 tablet 5      Home: Home Living Family/patient expects to be discharged to:: Private residence Living Arrangements: Children (PT cannot remember home details) Available Help at Discharge: Family Additional Comments: unable to remember his home  Functional History: Prior Function Level of Independence:  (pt cannot remember) Functional Status:  Mobility: Bed Mobility Overal bed mobility: Modified Independent General bed mobility comments: mod I to sit up in bed Transfers Overall transfer level: Needs assistance Equipment used: Rolling walker (2 wheeled), None Transfers: Sit to/from Stand, [employer] Transfers Sit to Stand: Min assist, Min guard Stand pivot transfers: Min guard General transfer comment: Pt stood from EOB with RW for orthostatic vitals. Min A given to power up. Pt stood a second time from EOB with min guard for safety and pivoted to recliner chair w/o AD Ambulation/Gait General Gait Details: defered   ADL:   Cognition: Cognition Overall Cognitive Status: Difficult to  assess Orientation Level: Oriented to person, Oriented to place Cognition Arousal/Alertness: Awake/alert Behavior During Therapy: Flat affect Overall Cognitive Status: Difficult to assess General Comments: Pt giving short one word answers to questions. He was disoriantated to situation. Son present and states that pt is more talkitive with family but closes up around others at baseline. Follows commands with increased time Difficult to assess due to: Impaired communication   Blood pressure (!) 154/84, pulse 88, temperature 98.3 F (36.8 C), temperature source Oral, resp. rate 20, height 5\' 6"  (1.676 m), weight 77.1 kg, SpO2 95 %. Physical Exam Vitals reviewed.  Constitutional:      General: He is not in acute distress.    Appearance: He is not ill-appearing.  HENT:     Head: Normocephalic and  atraumatic.     Right Ear: External ear normal.     Left Ear: External ear normal.     Nose: Nose normal.  Eyes:     General:        Right eye: No discharge.        Left eye: No discharge.     Extraocular Movements: Extraocular movements intact.  Cardiovascular:     Rate and Rhythm: Normal rate.  Pulmonary:     Effort: Pulmonary effort is normal. No respiratory distress.     Breath sounds: No stridor.  Abdominal:     General: Abdomen is flat. There is no distension.  Musculoskeletal:     Cervical back: Normal range of motion and neck supple.     Comments: No edema or tenderness in extremities  Skin:    General: Skin is warm and dry.  Neurological:     Mental Status: He is alert.     Comments: Alert and oriented to self and place but needed multiple cues for his age and time.   He does follow simple commands.   Motor: LUE: 4/5 proximal distal RLE: 5/5 proximal distal LLE/LUE: 5/5 proximal to distal  Psychiatric:        Mood and Affect: Mood normal.        Behavior: Behavior normal.        Lab Results Last 24 Hours       Results for orders placed or performed during the  hospital encounter of 08/02/20 (from the past 24 hour(s))  Basic metabolic panel     Status: Abnormal    Collection Time: 08/04/20  8:18 AM  Result Value Ref Range    Sodium 134 (L) 135 - 145 mmol/L    Potassium 3.7 3.5 - 5.1 mmol/L    Chloride 102 98 - 111 mmol/L    CO2 23 22 - 32 mmol/L    Glucose, Bld 107 (H) 70 - 99 mg/dL    BUN 5 (L) 8 - 23 mg/dL    Creatinine, Ser 0.58 (L) 0.61 - 1.24 mg/dL    Calcium 8.4 (L) 8.9 - 10.3 mg/dL    GFR calc non Af Amer >60 >60 mL/min    GFR calc Af Amer >60 >60 mL/min    Anion gap 9 5 - 15       Imaging Results (Last 48 hours)  MR BRAIN WO CONTRAST   Result Date: 08/02/2020 CLINICAL DATA:  Stroke, follow-up EXAM: MRI HEAD WITHOUT CONTRAST TECHNIQUE: Multiplanar, multiecho pulse sequences of the brain and surrounding structures were obtained without intravenous contrast. COMPARISON:  Correlation made with prior CT imaging FINDINGS: Brain: There is reduced diffusion in the left occipital lobe extending into the temporal and parietal lobes. There is also patchy reduced diffusion in the left frontal lobe. A small focus is present in the posterior left thalamus. No significant mass effect. There is minimal susceptibility hypointensity primarily in the left parietooccipital lobes likely reflecting petechial hemorrhage. Chronic infarct centered within the right parietal lobe. Additional chronic infarct of the left basal ganglia and adjacent white matter. Superimposed patchy and confluent areas of T2 hyperintensity in the supratentorial and pontine white matter are nonspecific but probably reflect moderate chronic microvascular ischemic changes. Prominence of the ventricles and sulci reflects generalized parenchymal volume loss. Vascular: Major vessel flow voids at the skull base are preserved. Skull and upper cervical spine: Normal marrow signal is preserved. Sinuses/Orbits: Paranasal sinuses are aerated. Orbits are unremarkable. Other: Sella is unremarkable.  Mastoid air cells are clear. IMPRESSION: Acute infarcts involving left posterior greater than anterior circulations, noting presence of a fetal left PCA on the prior CTA. No significant mass effect. There is minor petechial hemorrhage. Moderate chronic microvascular ischemic changes.  Chronic infarcts. Electronically Signed   By: Macy Mis M.D.   On: 08/02/2020 16:52       Assessment/Plan: Diagnosis: Acute infarct left posterior greater than anterior circulations  Stroke: Continue secondary stroke prophylaxis and Risk Factor Modification listed below:   Antiplatelet therapy:   Blood Pressure Management:  Continue current medication with prn's with permisive HTN per primary team Statin Agent:   Diabetes management:   Tobacco abuse:   PT/OT for mobility, ADL training  Labs independently reviewed.  Records reviewed and summated above.   1. Does the need for close, 24 hr/day medical supervision in concert with the patient's rehab needs make it unreasonable for this patient to be served in a less intensive setting? Yes  2. Co-Morbidities requiring supervision/potential complications: HTN (monitor and provide prns in accordance with increased physical exertion and pain), CVA, right carotid stenting September 2020 COPD with home oxygen, legally blind bilaterally, smoker and alcohol (counsel). 3. Due to bladder management, bowel management, safety, skin/wound care, disease management, medication administration and patient education, does the patient require 24 hr/day rehab nursing? Yes 4. Does the patient require coordinated care of a physician, rehab nurse, therapy disciplines of PT/OT/SLP to address physical and functional deficits in the context of the above medical diagnosis(es)? Yes Addressing deficits in the following areas: balance, endurance, locomotion, strength, transferring, bathing, dressing, toileting, cognition, language and psychosocial support 5. Can the patient actively participate  in an intensive therapy program of at least 3 hrs of therapy per day at least 5 days per week? Yes 6. The potential for patient to make measurable gains while on inpatient rehab is good 7. Anticipated functional outcomes upon discharge from inpatient rehab are modified independent and supervision  with PT, modified independent and supervision with OT, modified independent and supervision with SLP. 8. Estimated rehab length of stay to reach the above functional goals is: 5-8 days. 9. Anticipated discharge destination: Home 10. Overall Rehab/Functional Prognosis: good   RECOMMENDATIONS: This patient's condition is appropriate for continued rehabilitative care in the following setting: CIR if caregiver support available upon discharge. Patient has agreed to participate in recommended program. Yes Note that insurance prior authorization may be required for reimbursement for recommended care.   Comment: Rehab Admissions Coordinator to follow up.   I have personally performed a face to face diagnostic evaluation, including, but not limited to relevant history and physical exam findings, of this patient and developed relevant assessment and plan.  Additionally, I have reviewed and concur with the physician assistant's documentation above.    Delice Lesch, MD, ABPMR Nathan Parsons, PA-C 08/04/2020        Revision History                     Routing History           Note Details  Author Posey Pronto, Domenick Bookbinder, MD File Time 08/04/2020  3:35 PM  Author Type Physician Status Signed  Last Editor Nathan Arn, MD Service Physical Medicine and [place] # [id number] Admit Date 08/05/2020

## 2020-08-05 NOTE — TOC Transition Note (Signed)
Transition of Care [facility]) - CM/SW Discharge Note   Patient Details  Name: Nathan Hart MRN: [id number] Date of Birth: 11/24/1956  [facility] CM/SW Contact:  Pollie Friar, RN Phone Number: 08/05/2020, 11:43 AM   Clinical Narrative:    Pt and family decided on CIR instead of SNF. Pt is admitting today to CIR. CM signing off.  Final next level of care: IP Rehab Facility Barriers to Discharge: No Barriers Identified   Patient Goals and CMS Choice   CMS Medicare.gov Compare Post Acute Care list provided to:: Patient Choice offered to / list presented to : Patient, Adult Children  Discharge Placement                       Discharge Plan and Services In-house Referral: Clinical Social Work Discharge Planning Services: CM Consult Post Acute Care Choice: Skilled Nursing Facility                               Social Determinants of Health (SDOH) Interventions     Readmission Risk Interventions No flowsheet data found.

## 2020-08-05 NOTE — Progress Notes (Addendum)
Inpatient Rehabilitation Medication Review by a Pharmacist  A complete drug regimen review was completed for this patient to identify any potential clinically significant medication issues.  Clinically significant medication issues were identified:  no  Check AMION for pharmacist assigned to patient if future medication questions/issues arise during this admission.  Pharmacist comments:   On Aspirin 325 mg and Plavix 75 mg daily. Planning for 3 months, then aspirin alone, per Neurology.  Plavix begun on 08/02/20.   Time spent performing this drug regimen review (minutes):  10   Arty Baumgartner, [place] 08/05/2020 5:34 PM

## 2020-08-05 NOTE — Progress Notes (Signed)
Nathan Gong, RN  Rehab Admission Coordinator  Physical Medicine and Rehabilitation  PMR Pre-admission      Signed  Date of Service:  08/04/2020  4:34 PM      Related encounter: ED to Hosp-Admission (Discharged) from 08/02/2020 in [facility]      Signed       Show:Clear all [x] Manual[x] Template[x] Copied  Added by: [x] Nathan Gong, RN  [] Hover for details PMR Admission Coordinator Pre-Admission Assessment   Patient: Nathan Hart is an 64 y.o., male MRN: [id number] DOB: Apr 30, 1956 Height: 5\' 6"  (167.6 cm) Weight: 77.1 kg                                                                                                                                                  Insurance Information HMO:     PPO:      PCP:      IPA:      80/20:      OTHER:  PRIMARY: Medicare a and b      Policy#: [id number]      Subscriber: pt Benefits:  Phone #: passport one online     Name: 8/11 Eff. Date: 06/24/2020     Deduct: $9476      Out of Pocket Max: none      Life Max: none  CIR: 100%      SNF: 20 full days Outpatient: 80%     Co-Pay: 20% Home Health: 100%      Co-Pay: none DME: 805     Co-Pay: 205 Providers: pt choice  SECONDARY: Medicaid [place] access      Policy#: [id number]      Phone#: 8/11 passport one online [facility]   Financial Counselor:       Phone#:    The Therapist, art Information Summary" for patients in Inpatient Rehabilitation Facilities with attached "Privacy Act [place] Records" was provided and verbally reviewed with: Patient and Family   Emergency Contact Information Contact Information     Name Relation Home Work Mobile    Nathan Hart, Nathan Hart Son     [phone]    Nathan Hart, Nathan Hart Son     [phone]    Nathan Hart, Nathan Hart Spouse     [phone]       Current Medical History  Patient Admitting Diagnosis: CVA  History of Present Illness: LAEL PILCH is a 64 y.o. right-handed male with history of  hypertension, CVA, right carotid stenting September 2020 COPD with home oxygen, legally blind bilaterally, smoker and alcohol use as well as questionable medical compliance.  He presented on 08/02/2020 with right side numbness and hemiparesis.  Cranial CT scan showed acute left parietal occipital sequela of remote right MCA territory left basal ganglia insult.  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  Right ICA stent was patent.  Patient did not receive TPA.  MRI acute infarct left posterior greater than anterior circulations noted presence of a fetal left PCA on the prior CTA no significant mass-effect.  There was minor petechial hemorrhage.  Admission chemistries glucose 131, hemoglobin 11, alcohol negative, urine drug screen negative, urinalysis negative nitrite.  Currently maintained on aspirin and Plavix for CVA prophylaxis.  Subcutaneous Lovenox for DVT prophylaxis.  Tolerating a regular diet.     Complete NIHSS TOTAL: 6 Glasgow Coma Scale Score: 14   Past Medical History      Past Medical History:  Diagnosis Date  . Abnormal peripheral vision of left eye    . Blind right eye    . COPD (chronic obstructive pulmonary disease) (HCC)       home o2 hs and prn  . Dyspnea      with activity  . Empyema lung ([place])    . ETOH abuse    . HTN (hypertension)    . Peripheral vascular disease ([place])    . Pneumonia 2020  . Stroke [facility])        Family History  family history includes Cancer in his mother.   Prior Rehab/Hospitalizations:  Has the patient had prior rehab or hospitalizations prior to admission? Yes   Has the patient had major surgery during 100 days prior to admission? No   Current Medications    Current Facility-Administered Medications:  .  0.9 %  sodium chloride infusion, , Intravenous, Continuous, Danford, Suann Larry, MD, Last Rate: 75 mL/hr at 08/04/20 0653, New Bag at 08/04/20  0653 .  acetaminophen (TYLENOL) tablet 650 mg, 650 mg, Oral, Q4H PRN, 650 mg at 08/05/20 0833 **OR** acetaminophen (TYLENOL) 160 MG/5ML solution 650 mg, 650 mg, Per Tube, Q4H PRN **OR** acetaminophen (TYLENOL) suppository 650 mg, 650 mg, Rectal, Q4H PRN, Danford, Suann Larry, MD .  aspirin EC tablet 325 mg, 325 mg, Oral, Daily, 325 mg at 08/05/20 0956 **OR** aspirin suppository 300 mg, 300 mg, Rectal, Daily, Danford, Christopher P, MD .  atorvastatin (LIPITOR) tablet 80 mg, 80 mg, Oral, Daily, Danford, Suann Larry, MD, 80 mg at 08/05/20 0956 .  carvedilol (COREG) tablet 3.125 mg, 3.125 mg, Oral, BID WC, Dahal, Binaya, MD, 3.125 mg at 08/05/20 0824 .  clopidogrel (PLAVIX) tablet 75 mg, 75 mg, Oral, Daily, Danford, Suann Larry, MD, 75 mg at 08/05/20 0956 .  enoxaparin (LOVENOX) injection 40 mg, 40 mg, Subcutaneous, Q24H, Danford, Suann Larry, MD, 40 mg at 08/04/20 2103 .  folic acid (FOLVITE) tablet 1 mg, 1 mg, Oral, Daily, Danford, Suann Larry, MD, 1 mg at 08/05/20 0956 .  labetalol (NORMODYNE) injection 10 mg, 10 mg, Intravenous, Q6H PRN, Danford, Christopher P, MD .  LORazepam (ATIVAN) injection 1 mg, 1 mg, Intravenous, Once PRN, Danford, Suann Larry, MD .  LORazepam (ATIVAN) tablet 1-4 mg, 1-4 mg, Oral, Q1H PRN **OR** LORazepam (ATIVAN) injection 1-4 mg, 1-4 mg, Intravenous, Q1H PRN, Danford, Suann Larry, MD .  multivitamin with minerals tablet 1 tablet, 1 tablet, Oral, Daily, Danford, Suann Larry, MD, 1 tablet at 08/05/20 0956 .  senna-docusate (Senokot-S) tablet 1 tablet, 1 tablet, Oral, QHS PRN, Danford, Suann Larry, MD .  thiamine tablet 100 mg, 100 mg, Oral, Daily, 100 mg at 08/05/20 0956 **OR** thiamine (B-1) injection 100 mg, 100 mg, Intravenous, Daily, Danford, Suann Larry, MD   Patients Current Diet:     Diet Order  Diet Heart Room service appropriate? Yes; Fluid consistency: Thin  Diet effective now                      Precautions /  Restrictions Precautions Precautions: Fall Restrictions Weight Bearing Restrictions: No Other Position/Activity Restrictions: BLE strength WFL    Has the patient had 2 or more falls or a fall with injury in the past year?No   Prior Activity Level Community (5-7x/wk): Independent without AD pta; legally blind for 1 year; does not drive; alcoholic which son reports has affected his cognition   Prior Functional Level Prior Function Level of Independence: Independent Comments: I clairified PLOF and home layout with son, [name]   Self Care: Did the patient need help bathing, dressing, using the toilet or eating?  Independent   Indoor Mobility: Did the patient need assistance with walking from room to room (with or without device)? Independent   Stairs: Did the patient need assistance with internal or external stairs (with or without device)? Independent   Functional Cognition: Did the patient need help planning regular tasks such as shopping or remembering to take medications? Needed some help   Home Assistive Devices / [place] Devices/Equipment: None   Prior Device Use: Indicate devices/aids used by the patient prior to current illness, exacerbation or injury? None of the above   Current Functional Level Cognition   Arousal/Alertness: Awake/alert Overall Cognitive Status: Impaired/Different from baseline (history of deficits, however, suspect exacerbated at this time) Difficult to assess due to: Impaired communication Current Attention Level: Sustained Orientation Level: Oriented to person, Oriented to place, Disoriented to time, Disoriented to situation Following Commands: Follows one step commands consistently, Follows one step commands with increased time, Follows multi-step commands inconsistently Safety/Judgement: Decreased awareness of safety, Decreased awareness of deficits General Comments: Pt oriented to self and place only, reoriented to situation and  time.  Follows commands with increased time, but poor attention and recall.  Noted baseline cognitive deficits per chart review.  Attention: Focused Focused Attention: Impaired Memory: Impaired Awareness: Impaired Awareness Impairment: Intellectual impairment, Emergent impairment, Anticipatory impairment Problem Solving: Impaired Problem Solving Impairment: Verbal basic, Functional basic Safety/Judgment: Impaired    Extremity Assessment (includes Sensation/Coordination)   Upper Extremity Assessment: RUE deficits/detail RUE Deficits / Details: grossly 3-/5 MMT, unable to use functionally or use as functional support RUE Sensation: WNL RUE Coordination: decreased fine motor, decreased gross motor  Lower Extremity Assessment: Defer to PT evaluation     ADLs   Overall ADL's : Needs assistance/impaired Grooming: Moderate assistance, Sitting Upper Body Bathing: Moderate assistance, Sitting Lower Body Bathing: Moderate assistance, Sit to/from stand Upper Body Dressing : Moderate assistance, Sitting Lower Body Dressing: Moderate assistance, Sit to/from stand Lower Body Dressing Details (indicate cue type and reason): able to adjust socks but would need assist to don  Toilet Transfer: Minimal assistance, Stand-pivot Toilet Transfer Details (indicate cue type and reason): simulated from reclienr to EOB  Functional mobility during ADLs: Minimal assistance, Cueing for safety, Cueing for sequencing General ADL Comments: pt limited by cognition, R sided weakness, and impaired balance      Mobility   Overal bed mobility: Modified Independent General bed mobility comments: returned to supine without assist      Transfers   Overall transfer level: Needs assistance Equipment used: 1 person hand held assist Transfers: Sit to/from Stand, Stand Pivot Transfers Sit to Stand: Min assist Stand pivot transfers: Min assist General transfer comment: min assist from recliner to power up  and steady,  verbal cueing for pivot to EOB due to visual deficits      Ambulation / Gait / Stairs / Wheelchair Mobility   Ambulation/Gait General Gait Details: defered     Posture / Balance Dynamic Sitting Balance Sitting balance - Comments: sat EOB for ~5 min during orthostatic vitals Balance Overall balance assessment: Needs assistance Sitting-balance support: Feet supported Sitting balance-Leahy Scale: Fair Sitting balance - Comments: sat EOB for ~5 min during orthostatic vitals Standing balance support: No upper extremity supported, During functional activity Standing balance-Leahy Scale: Fair Standing balance comment: relies on 1 UE support during transfer     Special needs/care consideration ETOH abuse per son Smoker Admit day Visitor  is Stormy    Previous Geologist, engineering:  (lives with son, [name] and his girlfriend, Psychologist, counselling)  Lives With: Son, Other (Comment) Available Help at Discharge: Family, Available 24 hours/day Type of Home: House Home Layout: One level Home Access: Stairs to enter (1 step onto porch and 1 step into home) Entrance Stairs-Rails: None Entrance Stairs-Number of Steps: 1 step onto porch and one step into home Bathroom Shower/Tub: Tub/shower unit, Architectural technologist: Standard Bathroom Accessibility: Yes How Accessible: Accessible via walker [place]: No Additional Comments: pateitn lives with son, [name] and [place] girlfriend, Stormy   Discharge Living Setting Plans for Discharge Living Setting: Lives with (comment) (son, [name] Type of Home at Discharge: House Discharge Home Layout: One level Discharge Home Access: Stairs to enter Entrance Stairs-Rails: None Entrance Stairs-Number of Steps: 1 step onto porch and 1 step into home Discharge Bathroom Shower/Tub: Tub/shower unit, Curtain Discharge Bathroom Toilet: Standard Discharge Bathroom Accessibility: Yes How Accessible: Accessible via walker Does the patient have  any problems obtaining your medications?: No   Social/Family/Support Systems Patient Roles: Parent Contact Information: son, [name] is main contact Anticipated Caregiver: [name] and Stormy Anticipated Caregiver's Contact Information: [phone] Ability/Limitations of Caregiver: none Caregiver Availability: 24/7 Discharge Plan Discussed with Primary Caregiver: Yes Is Caregiver In Agreement with Plan?: Yes Does Caregiver/Family have Issues with Lodging/Transportation while Pt is in Rehab?: No   Goals Patient/Family Goal for Rehab: Mod I to supervision with PT, OT, and SLP Expected length of stay: ELOS 5 to 8 days Additional Information: ETOH abuse 6 pack per day Pt/Family Agrees to Admission and willing to participate: Yes Program Orientation Provided & Reviewed with Pt/Caregiver Including Roles  & Responsibilities: Yes   Decrease burden of Care through IP rehab admission: n/a   Possible need for SNF placement upon discharge:not anticipated   Patient Condition: This patient's condition remains as documented in the consult dated 08/04/2020, in which the Rehabilitation Physician determined and documented that the patient's condition is appropriate for intensive rehabilitative care in an inpatient rehabilitation facility. Will admit to inpatient rehab today.   Preadmission Screen Completed By:  Cleatrice Burke, RN, 08/05/2020 1:40 PM ______________________________________________________________________   Discussed status with Dr. Dagoberto Ligas on 08/05/2020 at  1341 and received approval for admission today.   Admission Coordinator:  Cleatrice Burke, time 1341 Date 08/05/2020             Cosigned by: Courtney Heys, MD at 08/05/2020  1:52 PM  Revision History                Note Details  Author Nathan Gong, RN File Time 08/05/2020  1:41 PM  Author Type Rehab Admission Coordinator Status Signed  Last Editor Nathan Gong, RN Service Physical Medicine and  Rehabilitation  Hospital Acct # [id number] Admit Date 08/05/2020

## 2020-08-05 NOTE — Plan of Care (Signed)
Care plan has been updated.

## 2020-08-05 NOTE — Progress Notes (Signed)
STROKE TEAM PROGRESS NOTE   SUBJECTIVE (INTERVAL HISTORY) Daughter-in-law is at bedside.  Patient lying in bed, neurological stable, no acute event overnight.  Patient on TED hose for orthostatic hypotension.  Currently BP stable, on Coreg low-dose 3.125 twice daily.  Ready for CIR admission.  OBJECTIVE Temp:  [97.4 F (36.3 C)-99.5 F (37.5 C)] 97.4 F (36.3 C) (08/12 1637) Pulse Rate:  [64-99] 76 (08/12 1637) Cardiac Rhythm: Normal sinus rhythm (08/12 0701) Resp:  [17-23] 17 (08/12 1637) BP: (125-178)/(63-91) 150/78 (08/12 1637) SpO2:  [93 %-100 %] 100 % (08/12 1637)  Recent Labs  Lab 08/02/20 1319  GLUCAP 126*   Recent Labs  Lab 08/02/20 1320 08/02/20 1325 08/03/20 0624 08/04/20 0818 08/05/20 0141  NA 138 140  --  134* 136  K 4.0 4.0  --  3.7 3.6  CL 103 103  --  102 103  CO2 25  --   --  23 23  GLUCOSE 131* 131*  --  107* 103*  BUN 10 10  --  5* 9  CREATININE 0.64 0.50*  --  0.58* 0.72  CALCIUM 8.8*  --   --  8.4* 8.3*  MG  --   --  1.8  --   --    Recent Labs  Lab 08/02/20 1320  AST 28  ALT 29  ALKPHOS 68  BILITOT 1.3*  PROT 7.3  ALBUMIN 3.4*   Recent Labs  Lab 08/02/20 1320 08/02/20 1325  WBC 8.1  --   NEUTROABS 6.3  --   HGB 11.0* 12.6*  HCT 39.5 37.0*  MCV 73.3*  --   PLT 291  --    No results for input(s): CKTOTAL, CKMB, CKMBINDEX, TROPONINI in the last 168 hours. No results for input(s): LABPROT, INR in the last 72 hours. Recent Labs    08/02/20 2015  COLORURINE YELLOW  LABSPEC 1.039*  PHURINE 6.0  GLUCOSEU NEGATIVE  HGBUR NEGATIVE  BILIRUBINUR NEGATIVE  KETONESUR NEGATIVE  PROTEINUR NEGATIVE  NITRITE NEGATIVE  LEUKOCYTESUR NEGATIVE       Component Value Date/Time   CHOL 102 08/03/2020 0624   TRIG 98 08/03/2020 0624   HDL 35 (L) 08/03/2020 0624   CHOLHDL 2.9 08/03/2020 0624   VLDL 20 08/03/2020 0624   LDLCALC 47 08/03/2020 0624   Lab Results  Component Value Date   HGBA1C 6.0 (H) 08/03/2020      Component Value  Date/Time   LABOPIA NONE DETECTED 08/02/2020 2015   COCAINSCRNUR NONE DETECTED 08/02/2020 2015   LABBENZ NONE DETECTED 08/02/2020 2015   AMPHETMU NONE DETECTED 08/02/2020 2015   THCU NONE DETECTED 08/02/2020 2015   LABBARB NONE DETECTED 08/02/2020 2015    Recent Labs  Lab 08/02/20 2015  ETH <10    I have personally reviewed the radiological images below and agree with the radiology interpretations.  CT Code Stroke CTA Head W/WO contrast  Result Date: 08/02/2020 CLINICAL DATA:  Neuro deficit EXAM: CT ANGIOGRAPHY HEAD AND NECK TECHNIQUE: Multidetector CT imaging of the head and neck was performed using the standard protocol during bolus administration of intravenous contrast. Multiplanar CT image reconstructions and MIPs were obtained to evaluate the vascular anatomy. Carotid stenosis measurements (when applicable) are obtained utilizing NASCET criteria, using the distal internal carotid diameter as the denominator. CONTRAST:  165mL OMNIPAQUE IOHEXOL 350 MG/ML SOLN COMPARISON:  Same day noncontrast head CT and CT cerebral perfusion. 09/08/2019 CT neck. FINDINGS: CTA NECK FINDINGS Aortic arch: Standard branching. Imaged portion shows no evidence of aneurysm or  dissection. No significant stenosis of the major arch vessel origins. Calcified atheromatous plaque. Right carotid system: No evidence of dissection, stenosis (50% or greater) or occlusion. Calcified atheromatous plaque at the bifurcation. Patent right stent spanning the distal common/proximal internal carotid arteries. Left carotid system: The proximal common left carotid artery demonstrates complete occlusion shortly after its takeoff to the level of the bifurcation where there is extensive calcified and noncalcified atheromatous plaque. There is a distal reconstitution of the left internal carotid and external carotid arteries. The left internal carotid artery demonstrates comparatively diminished opacification. Vertebral arteries: Dominant  right vertebral artery. Diminutive left vertebral artery with V4 segment atherosclerotic calcifications. No definite focal high-grade narrowing however diffuse vessel wall irregularity likely reflects atheromatous disease. No evidence of dissection, stenosis (50% or greater) or occlusion. Skeleton: Multilevel spondylosis with reversal of cervical lordosis. Grade 1 C2-3 and C7-T1 anterolisthesis. Grade 1 C5-6 and C6-7 retrolisthesis. No acute osseous abnormality. Other neck: No adenopathy.  No soft tissue mass. Upper chest: Emphysema.  Biapical atelectasis. Review of the MIP images confirms the above findings CTA HEAD FINDINGS Anterior circulation: Asymmetrically diminished opacification of the left internal carotid artery. Bilateral carotid siphon atherosclerotic calcifications, right greater than left. Distal left ICA cervical segment and petrous segment atherosclerotic calcifications. Diminutive left M1 segment. No aneurysm or vascular malformation. Posterior circulation: Dominant right vertebral artery. Dominant right PCA. Fetal origin of the left PCA. Mild-to-moderate irregularity involving the left P2 segment likely secondary to atheromatous disease. No aneurysm, or vascular malformation. Asymmetrically diminished opacification of the left parietooccipital cortical branches. Venous sinuses: No evidence of thrombosis. Anatomic variants: Bilateral PCOM hypoplasia. Review of the MIP images confirms the above findings IMPRESSION: 1. 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 Distal reconstitution of the left internal and external carotid arteries. 2. Asymmetrically diminished opacification of the left internal carotid artery to the terminus. 3. Mild to moderate narrowing and irregularity involving the left P2 segment, likely secondary to atheromatous disease. 4. Asymmetrically diminished opacification of left parietooccipital cortical branches. 5.  Patent right common/proximal internal carotid artery stent. 6. Diminutive left vertebral artery with V4 segment atherosclerotic calcifications. These results were called by telephone at the time of interpretation on 08/02/2020 at 2:46 pm to provider Dr. Erlinda Hong, Who verbally acknowledged these results. Electronically Signed   By: Primitivo Gauze M.D.   On: 08/02/2020 14:58   CT Code Stroke CTA Neck W/WO contrast  Result Date: 08/02/2020 CLINICAL DATA:  Neuro deficit EXAM: CT ANGIOGRAPHY HEAD AND NECK TECHNIQUE: Multidetector CT imaging of the head and neck was performed using the standard protocol during bolus administration of intravenous contrast. Multiplanar CT image reconstructions and MIPs were obtained to evaluate the vascular anatomy. Carotid stenosis measurements (when applicable) are obtained utilizing NASCET criteria, using the distal internal carotid diameter as the denominator. CONTRAST:  155mL OMNIPAQUE IOHEXOL 350 MG/ML SOLN COMPARISON:  Same day noncontrast head CT and CT cerebral perfusion. 09/08/2019 CT neck. FINDINGS: CTA NECK FINDINGS Aortic arch: Standard branching. Imaged portion shows no evidence of aneurysm or dissection. No significant stenosis of the major arch vessel origins. Calcified atheromatous plaque. Right carotid system: No evidence of dissection, stenosis (50% or greater) or occlusion. Calcified atheromatous plaque at the bifurcation. Patent right stent spanning the distal common/proximal internal carotid arteries. Left carotid system: The proximal common left carotid artery demonstrates complete occlusion shortly after its takeoff to the level of the bifurcation where there is extensive calcified and noncalcified  atheromatous plaque. There is a distal reconstitution of the left internal carotid and external carotid arteries. The left internal carotid artery demonstrates comparatively diminished opacification. Vertebral arteries: Dominant right vertebral artery. Diminutive left  vertebral artery with V4 segment atherosclerotic calcifications. No definite focal high-grade narrowing however diffuse vessel wall irregularity likely reflects atheromatous disease. No evidence of dissection, stenosis (50% or greater) or occlusion. Skeleton: Multilevel spondylosis with reversal of cervical lordosis. Grade 1 C2-3 and C7-T1 anterolisthesis. Grade 1 C5-6 and C6-7 retrolisthesis. No acute osseous abnormality. Other neck: No adenopathy.  No soft tissue mass. Upper chest: Emphysema.  Biapical atelectasis. Review of the MIP images confirms the above findings CTA HEAD FINDINGS Anterior circulation: Asymmetrically diminished opacification of the left internal carotid artery. Bilateral carotid siphon atherosclerotic calcifications, right greater than left. Distal left ICA cervical segment and petrous segment atherosclerotic calcifications. Diminutive left M1 segment. No aneurysm or vascular malformation. Posterior circulation: Dominant right vertebral artery. Dominant right PCA. Fetal origin of the left PCA. Mild-to-moderate irregularity involving the left P2 segment likely secondary to atheromatous disease. No aneurysm, or vascular malformation. Asymmetrically diminished opacification of the left parietooccipital cortical branches. Venous sinuses: No evidence of thrombosis. Anatomic variants: Bilateral PCOM hypoplasia. Review of the MIP images confirms the above findings IMPRESSION: 1. 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 Distal reconstitution of the left internal and external carotid arteries. 2. Asymmetrically diminished opacification of the left internal carotid artery to the terminus. 3. Mild to moderate narrowing and irregularity involving the left P2 segment, likely secondary to atheromatous disease. 4. Asymmetrically diminished opacification of left parietooccipital cortical branches. 5. Patent right common/proximal internal  carotid artery stent. 6. Diminutive left vertebral artery with V4 segment atherosclerotic calcifications. These results were called by telephone at the time of interpretation on 08/02/2020 at 2:46 pm to provider Dr. Erlinda Hong, Who verbally acknowledged these results. Electronically Signed   By: Primitivo Gauze M.D.   On: 08/02/2020 14:58   MR BRAIN WO CONTRAST  Result Date: 08/02/2020 CLINICAL DATA:  Stroke, follow-up EXAM: MRI HEAD WITHOUT CONTRAST TECHNIQUE: Multiplanar, multiecho pulse sequences of the brain and surrounding structures were obtained without intravenous contrast. COMPARISON:  Correlation made with prior CT imaging FINDINGS: Brain: There is reduced diffusion in the left occipital lobe extending into the temporal and parietal lobes. There is also patchy reduced diffusion in the left frontal lobe. A small focus is present in the posterior left thalamus. No significant mass effect. There is minimal susceptibility hypointensity primarily in the left parietooccipital lobes likely reflecting petechial hemorrhage. Chronic infarct centered within the right parietal lobe. Additional chronic infarct of the left basal ganglia and adjacent white matter. Superimposed patchy and confluent areas of T2 hyperintensity in the supratentorial and pontine white matter are nonspecific but probably reflect moderate chronic microvascular ischemic changes. Prominence of the ventricles and sulci reflects generalized parenchymal volume loss. Vascular: Major vessel flow voids at the skull base are preserved. Skull and upper cervical spine: Normal marrow signal is preserved. Sinuses/Orbits: Paranasal sinuses are aerated. Orbits are unremarkable. Other: Sella is unremarkable.  Mastoid air cells are clear. IMPRESSION: Acute infarcts involving left posterior greater than anterior circulations, noting presence of a fetal left PCA on the prior CTA. No significant mass effect. There is minor petechial hemorrhage. Moderate chronic  microvascular ischemic changes.  Chronic infarcts. Electronically Signed   By: Macy Mis M.D.   On: 08/02/2020 16:52   IR Angiogram Extremity Left  Result Date: 08/05/2020 CLINICAL DATA:  New onset of right visual deficit with right-sided hemiparesis. Abnormal CT angiogram of the head and neck. EXAM: BILATERAL COMMON CAROTID AND INNOMINATE ANGIOGRAPHY COMPARISON:  Diagnostic catheter arteriogram of September 24, 2019 and CT angiogram the head and neck of August 02, 2020. MEDICATIONS: Heparin 2000 units IA. No antibiotic was administered within 1 hour of the procedure. ANESTHESIA/SEDATION: Versed 1 mg IV; Fentanyl 25 mcg IV Moderate Sedation Time:  43 minutes The patient was continuously monitored during the procedure by the interventional radiology nurse under my direct supervision. CONTRAST:  Isovue 300 approximately 65 mL FLUOROSCOPY TIME:  Fluoroscopy Time: 11 minutes 30 seconds (601 mGy). COMPLICATIONS: None immediate. TECHNIQUE: Informed written consent was obtained from the patient after a thorough discussion of the procedural risks, benefits and alternatives. All questions were addressed. Maximal Sterile Barrier Technique was utilized including caps, mask, sterile gowns, sterile gloves, sterile drape, hand hygiene and skin antiseptic. A timeout was performed prior to the initiation of the procedure. The right forearm to the wrist was prepped and draped in the usual sterile manner. The radial artery was identified with ultrasound, and its morphology documented. A dorsal palmar anastomosis was verified to be present. Using a micropuncture set, right radial access was obtained over a 0.018 inch micro guidewire. Over this, a 4/5 French radial sheath was inserted. The micro guidewire, and the obturator were removed. Good aspiration was obtained from the side port of the sheath. A cocktail of 2000 units of heparin, 2.5 mg of verapamil, and 200 mcg of nitroglycerin was then infused without event. A right  radial arteriogram was obtained. Over a 0.035 inch Roadrunner guidewire, a 5 [place] Simmons 2 diagnostic catheter was advanced to the aortic arch region, and selectively positioned in the right vertebral artery, the right common carotid artery, the left common carotid artery and the left subclavian artery. Following the procedure, hemostasis at the right radial puncture site was achieved with a wrist band. Distal right radial pulse was verified to be present. FINDINGS: The right vertebral artery origin is widely patent. The vessel is seen to opacify to the cranial skull base. Wide patency is seen of the right posterior-inferior cerebellar artery and the right vertebrobasilar junction. There is a mild to moderate stenosis of the proximal basilar artery. More distally the basilar artery, the right posterior cerebral artery, the superior cerebellar arteries and the anterior-inferior cerebellar arteries opacify into the capillary and venous phases. 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 Also noted is retrograde opacification of the left vertebrobasilar junction and more proximally of the distal left vertebral artery. Subsequent retrograde opacification via the left occipital artery of the left external carotid artery and the left internal carotid artery. The left cavernous and supraclinoid segments and subsequently partial opacification of the left middle cerebral artery is seen. Flash filling of the left posterior communicating artery proximally is evident. Also demonstrated is a proximal 25-50% stenosis of the non dominant left vertebrobasilar junction just distal to the origin of the left posterior-inferior cerebellar artery. Both the posterior-inferior cerebellar arteries demonstrate focal areas of caliber irregularity with narrowing likely representative of intracranial arteriosclerosis. The left common carotid arteriogram demonstrates complete angiographic  occlusion of the left common carotid artery in its mid to its distal third associated with significant arteriosclerotic calcification. No distal reconstitution of the left internal carotid artery or of the left external carotid artery is seen. The right common carotid arteriogram demonstrates wide patency  of the right common carotid artery proximally and also in the proximal stented segment. The right internal carotid artery proximally within the stented segment and distally is widely patent. Patency is maintained of the petrous, the cavernous and the supraclinoid segments with mild arteriosclerotic narrowing of the horizontal segment of the petrous portion. The right middle and the right anterior cerebral artery opacify into the capillary and venous phases. Prompt cross-filling via the anterior communicating artery of the left anterior cerebral artery A1 and A2 segments, and also the left middle cerebral artery is seen. The superior division of the left middle cerebral artery demonstrates hypoattenuation in its proximal segment which may represent unopacified blood filling retrogradely from the left internal carotid artery as described above versus nonobstructive arteriosclerotic disease. Overall caliber of the left middle cerebral artery appears narrower than its counter part on the right side. The left subclavian arteriogram demonstrates patency of the thyrocervical trunk branches. There is suggestion of partial reconstitution of the left vertebral artery from collaterals arising from the ascending cervical branch of the thyrocervical trunk at the level of C3-C4. 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 Retrograde opacification of the left external carotid artery and the left internal carotid artery from the posterior circulation from the right vertebral artery and retrogradely the left vertebral artery via the left occipital artery. Occluded right external  carotid artery at its origin. Cross-filling of the left anterior cerebral artery and the left middle cerebral artery from the right ICA via the anterior communicating artery, and partially from the posterior circulation as described above. Occluded left posterior cerebral artery just distal to its origin. PLAN: Findings reviewed with the patient and referring neurologist. Follow-up ultrasound of the carotids in 6 months. Electronically Signed   By: Luanne Bras M.D.   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 Abnormal CT angiogram of the head and neck. EXAM: BILATERAL COMMON CAROTID AND INNOMINATE ANGIOGRAPHY COMPARISON:  Diagnostic catheter arteriogram of September 24, 2019 and CT angiogram the head and neck of August 02, 2020. MEDICATIONS: Heparin 2000 units IA. No antibiotic was administered within 1 hour of the procedure. ANESTHESIA/SEDATION: Versed 1 mg IV; Fentanyl 25 mcg IV Moderate Sedation Time:  43 minutes The patient was continuously monitored during the procedure by the interventional radiology nurse under my direct supervision. CONTRAST:  Isovue 300 approximately 65 mL FLUOROSCOPY TIME:  Fluoroscopy Time: 11 minutes 30 seconds (601 mGy). COMPLICATIONS: None immediate. TECHNIQUE: Informed written consent was obtained from the patient after a thorough discussion of the procedural risks, benefits and alternatives. All questions were addressed. Maximal Sterile Barrier Technique was utilized including caps, mask, sterile gowns, sterile gloves, sterile drape, hand hygiene and skin antiseptic. A timeout was performed prior to the initiation of the procedure. The right forearm to the wrist was prepped and draped in the usual sterile manner. The radial artery was identified with ultrasound, and its morphology documented. A dorsal palmar anastomosis was verified to be present. Using a  micropuncture set, right radial access was obtained over a 0.018 inch micro guidewire. Over this, a 4/5 French radial sheath was inserted. The micro guidewire, and the obturator were removed. Good aspiration was obtained from the side port of the sheath. A cocktail of 2000 units of heparin, 2.5 mg of verapamil, and 200 mcg of nitroglycerin was then infused without event. A right radial  arteriogram was obtained. Over a 0.035 inch Roadrunner guidewire, a 5 [place] Simmons 2 diagnostic catheter was advanced to the aortic arch region, and selectively positioned in the right vertebral artery, the right common carotid artery, the left common carotid artery and the left subclavian artery. Following the procedure, hemostasis at the right radial puncture site was achieved with a wrist band. Distal right radial pulse was verified to be present. FINDINGS: The right vertebral artery origin is widely patent. The vessel is seen to opacify to the cranial skull base. Wide patency is seen of the right posterior-inferior cerebellar artery and the right vertebrobasilar junction. There is a mild to moderate stenosis of the proximal basilar artery. More distally the basilar artery, the right posterior cerebral artery, the superior cerebellar arteries and the anterior-inferior cerebellar arteries opacify into the capillary and venous phases. 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 Also noted is retrograde opacification of the left vertebrobasilar junction and more proximally of the distal left vertebral artery. Subsequent retrograde opacification via the left occipital artery of the left external carotid artery and the left internal carotid artery. The left cavernous and supraclinoid segments and subsequently partial opacification of the left middle cerebral artery is seen. Flash filling of the left posterior communicating artery proximally is evident. Also demonstrated is a proximal  25-50% stenosis of the non dominant left vertebrobasilar junction just distal to the origin of the left posterior-inferior cerebellar artery. Both the posterior-inferior cerebellar arteries demonstrate focal areas of caliber irregularity with narrowing likely representative of intracranial arteriosclerosis. The left common carotid arteriogram demonstrates complete angiographic occlusion of the left common carotid artery in its mid to its distal third associated with significant arteriosclerotic calcification. No distal reconstitution of the left internal carotid artery or of the left external carotid artery is seen. The right common carotid arteriogram demonstrates wide patency of the right common carotid artery proximally and also in the proximal stented segment. The right internal carotid artery proximally within the stented segment and distally is widely patent. Patency is maintained of the petrous, the cavernous and the supraclinoid segments with mild arteriosclerotic narrowing of the horizontal segment of the petrous portion. The right middle and the right anterior cerebral artery opacify into the capillary and venous phases. Prompt cross-filling via the anterior communicating artery of the left anterior cerebral artery A1 and A2 segments, and also the left middle cerebral artery is seen. The superior division of the left middle cerebral artery demonstrates hypoattenuation in its proximal segment which may represent unopacified blood filling retrogradely from the left internal carotid artery as described above versus nonobstructive arteriosclerotic disease. Overall caliber of the left middle cerebral artery appears narrower than its counter part on the right side. The left subclavian arteriogram demonstrates patency of the thyrocervical trunk branches. There is suggestion of partial reconstitution of the left vertebral artery from collaterals arising from the ascending cervical branch of the thyrocervical trunk  at the level of C3-C4. 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 Retrograde opacification of the left external carotid artery and the left internal carotid artery from the posterior circulation from the right vertebral artery and retrogradely the left vertebral artery via the left occipital artery. Occluded right external carotid artery at its origin. Cross-filling of the left anterior cerebral artery and the left middle cerebral artery from the right ICA via the anterior communicating artery, and partially from the posterior circulation as described above. Occluded left  posterior cerebral artery just distal to its origin. PLAN: Findings reviewed with the patient and referring neurologist. Follow-up ultrasound of the carotids in 6 months. Electronically Signed   By: Luanne Bras M.D.   On: 08/04/2020 10:45   CT CEREBRAL PERFUSION W CONTRAST  Result Date: 08/02/2020 CLINICAL DATA:  Code stroke EXAM: CT PERFUSION BRAIN TECHNIQUE: Multiphase CT imaging of the brain was performed following IV bolus contrast injection. Subsequent parametric perfusion maps were calculated using RAPID software. CONTRAST:  131mL OMNIPAQUE IOHEXOL 350 MG/ML SOLN COMPARISON:  Same day noncontrast head CT.  09/07/2019 head CT. FINDINGS: CT Brain Perfusion Findings: CBF (<30%) Volume: 5mL Perfusion (Tmax>6.0s) volume: 31mL Mismatch Volume: 24mL ASPECTS on noncontrast CT Head: 10 at 1:50 p.m. Today. Infarct Core: 33 mL Infarction Location:Left parietooccipital region. IMPRESSION: 33 mL core infarct involving left parietooccipital region with surrounding ischemic penumbra of 45 mL. Electronically Signed   By: Primitivo Gauze M.D.   On: 08/02/2020 13:58   ECHOCARDIOGRAM COMPLETE  Result Date: 08/04/2020    ECHOCARDIOGRAM REPORT   Patient Name:   Nathan Hart [facility] Date of Exam: 08/04/2020 Medical Rec #:  [id number]          Height:       66.0 in Accession #:    [id number]          Weight:       170.0 lb Date of Birth:  12/17/1956          BSA:          1.866 m Patient Age:    33 years           BP:           154/84 mmHg Patient Gender: M                  HR:           88 bpm. Exam Location:  Inpatient Procedure: 2D Echo and Intracardiac Opacification Agent Indications:    Stroke 434.91 / I163.9  History:        Patient has prior history of Echocardiogram examinations, most                 recent 09/08/2019. Carotid Disease and COPD; Risk                 Factors:Hypertension, Dyslipidemia and Current Smoker.  Sonographer:    Darlina Sicilian RDCS Referring Phys: [id number] CHRISTOPHER P DANFORD IMPRESSIONS  1. Left ventricular ejection fraction, by estimation, is 55 to 60%. The left ventricle has normal function. The left ventricle demonstrates regional wall motion abnormalities with possible basal inferior hypokinesis. There is mild left ventricular hypertrophy. Left ventricular diastolic parameters are consistent with Grade I diastolic dysfunction (impaired relaxation).  2. Right ventricular systolic function is normal. The right ventricular size is normal. Tricuspid regurgitation signal is inadequate for assessing PA pressure.  3. The mitral valve is normal in structure. No evidence of mitral valve regurgitation. No evidence of mitral stenosis.  4. The aortic valve is tricuspid. Aortic valve regurgitation is not visualized. No aortic stenosis is present.  5. The inferior vena cava is normal in size with greater than 50% respiratory variability, suggesting right atrial pressure of 3 mmHg.  6. Technically difficult study with poor acoustic windows. FINDINGS  Left Ventricle: Left ventricular ejection fraction, by estimation, is 55 to 60%. The left ventricle has normal function. The left ventricle demonstrates regional wall motion abnormalities. Definity contrast agent was given IV to  delineate the left ventricular endocardial borders. The left ventricular internal cavity size was normal in size.  There is mild left ventricular hypertrophy. Left ventricular diastolic parameters are consistent with Grade I diastolic dysfunction (impaired relaxation). Right Ventricle: The right ventricular size is normal. No increase in right ventricular wall thickness. Right ventricular systolic function is normal. Tricuspid regurgitation signal is inadequate for assessing PA pressure. Left Atrium: Left atrial size was normal in size. Right Atrium: Right atrial size was normal in size. Pericardium: There is no evidence of pericardial effusion. Mitral Valve: The mitral valve is normal in structure. There is mild thickening of the mitral valve leaflet(s). There is moderate calcification of the mitral valve leaflet(s). No evidence of mitral valve regurgitation. No evidence of mitral valve stenosis. Tricuspid Valve: The tricuspid valve is normal in structure. Tricuspid valve regurgitation is not demonstrated. Aortic Valve: The aortic valve is tricuspid. Aortic valve regurgitation is not visualized. No aortic stenosis is present. Pulmonic Valve: The pulmonic valve was normal in structure. Pulmonic valve regurgitation is not visualized. Aorta: The aortic root is normal in size and structure. Venous: The inferior vena cava is normal in size with greater than 50% respiratory variability, suggesting right atrial pressure of 3 mmHg. IAS/Shunts: No atrial level shunt detected by color flow Doppler.  LEFT VENTRICLE PLAX 2D LVIDd:         3.80 cm  Diastology LVIDs:         2.90 cm  LV e' lateral:   6.22 cm/s LV PW:         1.10 cm  LV E/e' lateral: 8.3 LV IVS:        1.40 cm  LV e' medial:    6.57 cm/s LVOT diam:     2.10 cm  LV E/e' medial:  7.9 LV SV:         46 LV SV Index:   25 LVOT Area:     3.46 cm  LEFT ATRIUM             Index LA diam:        2.90 cm 1.55 cm/m LA Vol (A2C):   36.8 ml 19.72 ml/m LA Vol (A4C):   31.5 ml 16.88 ml/m LA Biplane Vol: 36.6 ml 19.61 ml/m  AORTIC VALVE LVOT Vmax:   76.90 cm/s LVOT Vmean:  49.100 cm/s  LVOT VTI:    0.133 m  AORTA Ao Root diam: 3.20 cm MITRAL VALVE MV Area (PHT): 3.72 cm    SHUNTS MV Decel Time: 204 msec    Systemic VTI:  0.13 m MV E velocity: 51.90 cm/s  Systemic Diam: 2.10 cm MV A velocity: 86.80 cm/s MV E/A ratio:  0.60 Loralie Champagne MD Electronically signed by Loralie Champagne MD Signature Date/Time: 08/04/2020/4:48:24 PM    Final    CT HEAD CODE STROKE WO CONTRAST  Result Date: 08/02/2020 CLINICAL DATA:  Code stroke.  Neuro deficit EXAM: CT HEAD WITHOUT CONTRAST TECHNIQUE: Contiguous axial images were obtained from the base of the skull through the vertex without intravenous contrast. COMPARISON:  09/07/2019 head CT. FINDINGS: Brain: Ill-defined hypodense region involving the left parietooccipital region with blurring of the gray-white interface is concerning for acute infarct (5:55). Sequela of remote right MCA territory and left basal ganglia insults. Background scattered and confluent supratentorial white matter hypodensities are nonspecific however commonly associated with chronic microvascular ischemic changes. No mass lesion. No midline shift, ventriculomegaly or extra-axial fluid collection. Vascular: No hyperdense vessel. Bilateral skull base atherosclerotic calcifications. Skull: Negative  for fracture or focal lesion. Sinuses/Orbits: Normal orbits. Clear paranasal sinuses. No mastoid effusion. Other: None. ASPECTS [facility] Stroke Program Early CT Score) - Ganglionic level infarction (caudate, lentiform nuclei, internal capsule, insula, M1-M3 cortex): 7 - Supraganglionic infarction (M4-M6 cortex): 3 Total score (0-10 with 10 being normal): 10 IMPRESSION: 1. Acute left parietooccipital infarct.  ASPECTS is 10. 2. Sequela of remote right MCA territory and left basal ganglia insults. 3. Chronic microvascular ischemic changes. Code stroke imaging results were communicated on 08/02/2020 at 1:51 pm to provider Dr. Curly Shores Via [employer] secure text paging. Electronically Signed   By: Primitivo Gauze M.D.   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 Abnormal CT angiogram of the head and neck. EXAM: BILATERAL COMMON CAROTID AND INNOMINATE ANGIOGRAPHY COMPARISON:  Diagnostic catheter arteriogram of September 24, 2019 and CT angiogram the head and neck of August 02, 2020. MEDICATIONS: Heparin 2000 units IA. No antibiotic was administered within 1 hour of the procedure. ANESTHESIA/SEDATION: Versed 1 mg IV; Fentanyl 25 mcg IV Moderate Sedation Time:  43 minutes The patient was continuously monitored during the procedure by the interventional radiology nurse under my direct supervision. CONTRAST:  Isovue 300 approximately 65 mL FLUOROSCOPY TIME:  Fluoroscopy Time: 11 minutes 30 seconds (601 mGy). COMPLICATIONS: None immediate. TECHNIQUE: Informed written consent was obtained from the patient after a thorough discussion of the procedural risks, benefits and alternatives. All questions were addressed. Maximal Sterile Barrier Technique was utilized including caps, mask, sterile gowns, sterile gloves, sterile drape, hand hygiene and skin antiseptic. A timeout was performed prior to the initiation of the procedure. The right forearm to the wrist was prepped and draped in the usual sterile manner. The radial artery was identified with ultrasound, and its morphology documented. A dorsal palmar anastomosis was verified to be present. Using a micropuncture set, right radial access was obtained over a 0.018 inch micro guidewire. Over this, a 4/5 French radial sheath was inserted. The micro guidewire, and the obturator were removed. Good aspiration was obtained from the side port of the sheath. A cocktail of 2000 units of heparin, 2.5 mg of verapamil, and 200 mcg of nitroglycerin was then infused without event. A right radial arteriogram was obtained. Over a 0.035 inch  Roadrunner guidewire, a 5 [place] Simmons 2 diagnostic catheter was advanced to the aortic arch region, and selectively positioned in the right vertebral artery, the right common carotid artery, the left common carotid artery and the left subclavian artery. Following the procedure, hemostasis at the right radial puncture site was achieved with a wrist band. Distal right radial pulse was verified to be present. FINDINGS: The right vertebral artery origin is widely patent. The vessel is seen to opacify to the cranial skull base. Wide patency is seen of the right posterior-inferior cerebellar artery and the right vertebrobasilar junction. There is a mild to moderate stenosis of the proximal basilar artery. More distally the basilar artery, the right posterior cerebral artery, the superior cerebellar arteries and the anterior-inferior cerebellar arteries opacify into the capillary and venous phases. 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 Also noted is retrograde opacification of the left vertebrobasilar junction and more proximally of the distal left vertebral artery. Subsequent retrograde opacification via the left occipital artery of the left external carotid artery and the left internal carotid artery.  The left cavernous and supraclinoid segments and subsequently partial opacification of the left middle cerebral artery is seen. Flash filling of the left posterior communicating artery proximally is evident. Also demonstrated is a proximal 25-50% stenosis of the non dominant left vertebrobasilar junction just distal to the origin of the left posterior-inferior cerebellar artery. Both the posterior-inferior cerebellar arteries demonstrate focal areas of caliber irregularity with narrowing likely representative of intracranial arteriosclerosis. The left common carotid arteriogram demonstrates complete angiographic occlusion of the left common carotid artery in its  mid to its distal third associated with significant arteriosclerotic calcification. No distal reconstitution of the left internal carotid artery or of the left external carotid artery is seen. The right common carotid arteriogram demonstrates wide patency of the right common carotid artery proximally and also in the proximal stented segment. The right internal carotid artery proximally within the stented segment and distally is widely patent. Patency is maintained of the petrous, the cavernous and the supraclinoid segments with mild arteriosclerotic narrowing of the horizontal segment of the petrous portion. The right middle and the right anterior cerebral artery opacify into the capillary and venous phases. Prompt cross-filling via the anterior communicating artery of the left anterior cerebral artery A1 and A2 segments, and also the left middle cerebral artery is seen. The superior division of the left middle cerebral artery demonstrates hypoattenuation in its proximal segment which may represent unopacified blood filling retrogradely from the left internal carotid artery as described above versus nonobstructive arteriosclerotic disease. Overall caliber of the left middle cerebral artery appears narrower than its counter part on the right side. The left subclavian arteriogram demonstrates patency of the thyrocervical trunk branches. There is suggestion of partial reconstitution of the left vertebral artery from collaterals arising from the ascending cervical branch of the thyrocervical trunk at the level of C3-C4. 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 Retrograde opacification of the left external carotid artery and the left internal carotid artery from the posterior circulation from the right vertebral artery and retrogradely the left vertebral artery via the left occipital artery. Occluded right external carotid artery at its origin. Cross-filling of the left  anterior cerebral artery and the left middle cerebral artery from the right ICA via the anterior communicating artery, and partially from the posterior circulation as described above. Occluded left posterior cerebral artery just distal to its origin. PLAN: Findings reviewed with the patient and referring neurologist. Follow-up ultrasound of the carotids in 6 months. Electronically Signed   By: Luanne Bras M.D.   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 Abnormal CT angiogram of the head and neck. EXAM: BILATERAL COMMON CAROTID AND INNOMINATE ANGIOGRAPHY COMPARISON:  Diagnostic catheter arteriogram of September 24, 2019 and CT angiogram the head and neck of August 02, 2020. MEDICATIONS: Heparin 2000 units IA. No antibiotic was administered within 1 hour of the procedure. ANESTHESIA/SEDATION: Versed 1 mg IV; Fentanyl 25 mcg IV Moderate Sedation Time:  43 minutes The patient was continuously monitored during the procedure by the interventional radiology nurse under my direct supervision. CONTRAST:  Isovue 300 approximately 65 mL FLUOROSCOPY TIME:  Fluoroscopy Time: 11 minutes 30 seconds (601 mGy). COMPLICATIONS: None immediate. TECHNIQUE: Informed written consent was obtained from the patient after a thorough discussion of the procedural risks, benefits and alternatives. All questions were addressed. Maximal Sterile Barrier Technique was  utilized including caps, mask, sterile gowns, sterile gloves, sterile drape, hand hygiene and skin antiseptic. A timeout was performed prior to the initiation of the procedure. The right forearm to the wrist was prepped and draped in the usual sterile manner. The radial artery was identified with ultrasound, and its morphology documented. A dorsal palmar anastomosis was verified to be present. Using a micropuncture set, right radial access was  obtained over a 0.018 inch micro guidewire. Over this, a 4/5 French radial sheath was inserted. The micro guidewire, and the obturator were removed. Good aspiration was obtained from the side port of the sheath. A cocktail of 2000 units of heparin, 2.5 mg of verapamil, and 200 mcg of nitroglycerin was then infused without event. A right radial arteriogram was obtained. Over a 0.035 inch Roadrunner guidewire, a 5 [place] Simmons 2 diagnostic catheter was advanced to the aortic arch region, and selectively positioned in the right vertebral artery, the right common carotid artery, the left common carotid artery and the left subclavian artery. Following the procedure, hemostasis at the right radial puncture site was achieved with a wrist band. Distal right radial pulse was verified to be present. FINDINGS: The right vertebral artery origin is widely patent. The vessel is seen to opacify to the cranial skull base. Wide patency is seen of the right posterior-inferior cerebellar artery and the right vertebrobasilar junction. There is a mild to moderate stenosis of the proximal basilar artery. More distally the basilar artery, the right posterior cerebral artery, the superior cerebellar arteries and the anterior-inferior cerebellar arteries opacify into the capillary and venous phases. 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 Also noted is retrograde opacification of the left vertebrobasilar junction and more proximally of the distal left vertebral artery. Subsequent retrograde opacification via the left occipital artery of the left external carotid artery and the left internal carotid artery. The left cavernous and supraclinoid segments and subsequently partial opacification of the left middle cerebral artery is seen. Flash filling of the left posterior communicating artery proximally is evident. Also demonstrated is a proximal 25-50% stenosis of the non dominant left  vertebrobasilar junction just distal to the origin of the left posterior-inferior cerebellar artery. Both the posterior-inferior cerebellar arteries demonstrate focal areas of caliber irregularity with narrowing likely representative of intracranial arteriosclerosis. The left common carotid arteriogram demonstrates complete angiographic occlusion of the left common carotid artery in its mid to its distal third associated with significant arteriosclerotic calcification. No distal reconstitution of the left internal carotid artery or of the left external carotid artery is seen. The right common carotid arteriogram demonstrates wide patency of the right common carotid artery proximally and also in the proximal stented segment. The right internal carotid artery proximally within the stented segment and distally is widely patent. Patency is maintained of the petrous, the cavernous and the supraclinoid segments with mild arteriosclerotic narrowing of the horizontal segment of the petrous portion. The right middle and the right anterior cerebral artery opacify into the capillary and venous phases. Prompt cross-filling via the anterior communicating artery of the left anterior cerebral artery A1 and A2 segments, and also the left middle cerebral artery is seen. The superior division of the left middle cerebral artery demonstrates hypoattenuation in its proximal segment which may represent unopacified blood filling retrogradely from the left internal carotid artery as described above versus nonobstructive arteriosclerotic disease. Overall caliber of the left middle cerebral artery appears narrower than its counter part on the right  side. The left subclavian arteriogram demonstrates patency of the thyrocervical trunk branches. There is suggestion of partial reconstitution of the left vertebral artery from collaterals arising from the ascending cervical branch of the thyrocervical trunk at the level of C3-C4. 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 Retrograde opacification of the left external carotid artery and the left internal carotid artery from the posterior circulation from the right vertebral artery and retrogradely the left vertebral artery via the left occipital artery. Occluded right external carotid artery at its origin. Cross-filling of the left anterior cerebral artery and the left middle cerebral artery from the right ICA via the anterior communicating artery, and partially from the posterior circulation as described above. Occluded left posterior cerebral artery just distal to its origin. PLAN: Findings reviewed with the patient and referring neurologist. Follow-up ultrasound of the carotids in 6 months. Electronically Signed   By: Luanne Bras M.D.   On: 08/04/2020 10:45    PHYSICAL EXAM  Temp:  [97.4 F (36.3 C)-99.5 F (37.5 C)] 97.4 F (36.3 C) (08/12 1637) Pulse Rate:  [64-99] 76 (08/12 1637) Resp:  [17-23] 17 (08/12 1637) BP: (125-178)/(63-91) 150/78 (08/12 1637) SpO2:  [93 %-100 %] 100 % (08/12 1637)  General - well nourished, well developed, in no apparent distress.    Ophthalmologic - fundi not visualized due to noncooperation.    Cardiovascular - regular rhythm and rate  Mental Status -  Level of arousal and orientation to self, place and son were intact.  However, not orientated to age or time. Language including expression, naming, repetition, comprehension, reading, and writing was assessed and found intact.  Cranial Nerves II - XII - II - Visual field difficult test due to legally blind bilaterally, but more likely to have right hemianopia III, IV, VI - Extraocular movements intact, no gaze deviation. V - Facial sensation intact bilaterally. VII - left nasolabial fold flattening, likely chronic. VIII - Hearing & vestibular intact bilaterally. X - Palate elevates symmetrically. XI - Chin turning & shoulder shrug  intact bilaterally. XII - Tongue protrusion intact.  Motor Strength - The patient's strength was normal left upper extremities, however right upper extremity mild drift 4+/5 with decreased finger grip 3+/5, right hand dexterity difficulty.  Bilateral lower extremity equal strength 5/5.  Motor Tone & Bulk - Muscle tone was assessed at the neck and appendages and was normal.  Bulk was normal and fasciculations were absent.   Reflexes - The patient's reflexes were normal in all extremities and he had no pathological reflexes.  Sensory - Light touch, temperature/pinprick were assessed and were normal.    Coordination - The patient had normal movements in the hands with no ataxia or dysmetria, however slow on the right.  Tremor was absent.  Gait and Station - deferred   ASSESSMENT/PLAN Mr. MOHANAD CARSTEN is a 64 y.o. male with history of hypertension, PAD, CVA, COPD, legally blind bilaterally, smoker and alcohol user admitted for right-sided weakness and fall. No tPA given due to outside window, and established stroke.    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    CTA head and neck showed left CCA possible occlusion, and left ICA reconstitution with slow flow.  Right ICA stent patent, bilateral siphon high-grade stenosis.  Left P2 high-grade stenosis.    CTP 33 cc core  with 45 penumbra.  MRI  Acute infarcts involving left posterior greater than anterior circulations, noting presence of a fetal left PCA on the prior CTA  IR left CCA mid to distal occlusion, left MCA and ACA patent, right ICA stent patent.  2D Echo EF 55 to 60%  LDL 47  HgbA1c 6.0  Lovenox for VTE prophylaxis  No antithrombotic prior to admission, now on aspirin 325 mg daily and clopidogrel 75 mg daily DAPT for 3 months and then aspirin alone.  Patient counseled  to be compliant with his antithrombotic medications  Ongoing aggressive stroke risk factor management  Therapy recommendations: SNF vs. CIR  Disposition: Pending  Carotid stenosis  08/2019 cerebral angiogram showed right ICA 90% stenosis, left CCA 50% stenosis, left ICA 50% stenosis.  Left fetal PCA.  Left VA occluded with distal reconstitution  09/2019 status post right carotid stenting with Dr. Estanislado Pandy  This admission CTA head neck left CCA occlusion, right ICA stent patent  This admission cerebral angiogram left CCA mid to distal occlusion, right ICA stent patent  No endovascular procedure indicated, continue medical management.  Long-term BP goal 130-150  Orthostatic hypotension Hypertension . Stable on the high end lying BP 150-170s . 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 Please call with questions. Pt will follow up with stroke clinic Dr. Leonie Man at [facility] in about 4 weeks. Thanks for the consult.   Rosalin Hawking, MD PhD Stroke Neurology 08/05/2020 5:18 PM    To contact Stroke Continuity provider, please refer to [email or web address] After hours, contact General Neurology

## 2020-08-05 NOTE — TOC Transition Note (Signed)
Transition of Care [facility]) - CM/SW Discharge Note   Patient Details  Name: Nathan Hart MRN: [id number] Date of Birth: 1956/09/07  [facility] CM/SW Contact:  Pollie Friar, RN Phone Number: 08/05/2020, 1:45 PM   Clinical Narrative:    Pt discharging to CIR today. CM signing off.    Final next level of care: IP Rehab Facility Barriers to Discharge: No Barriers Identified   Patient Goals and CMS Choice   CMS Medicare.gov Compare Post Acute Care list provided to:: Patient Choice offered to / list presented to : Patient, Adult Children  Discharge Placement                       Discharge Plan and Services In-house Referral: Clinical Social Work Discharge Planning Services: CM Consult Post Acute Care Choice: Skilled Nursing Facility                               Social Determinants of Health (SDOH) Interventions     Readmission Risk Interventions No flowsheet data found.

## 2020-08-05 NOTE — Progress Notes (Signed)
Patient arrived on the unit with his previous nurse.  Daughter in law at bedside.  No obvious acute distress or discomfort noted.

## 2020-08-05 NOTE — Progress Notes (Signed)
Inpatient Rehabilitation Admissions Coordinator  I met with RN Cm, Vida Roller, patient and Stormy at bedside. Patient and family now in agreement to CIR admit. I will arrange admit today.  Danne Baxter, RN, MSN Rehab Admissions Coordinator [phone] 08/05/2020 1:40 PM

## 2020-08-06 ENCOUNTER — Inpatient Hospital Stay (HOSPITAL_COMMUNITY): Payer: Medicare Other | Admitting: Speech Pathology

## 2020-08-06 ENCOUNTER — Inpatient Hospital Stay (HOSPITAL_COMMUNITY): Payer: Medicare Other

## 2020-08-06 ENCOUNTER — Inpatient Hospital Stay (HOSPITAL_COMMUNITY): Payer: Medicare Other | Admitting: Occupational Therapy

## 2020-08-06 DIAGNOSIS — F0390 Unspecified dementia without behavioral disturbance: Secondary | ICD-10-CM

## 2020-08-06 DIAGNOSIS — D649 Anemia, unspecified: Secondary | ICD-10-CM

## 2020-08-06 DIAGNOSIS — E785 Hyperlipidemia, unspecified: Secondary | ICD-10-CM

## 2020-08-06 DIAGNOSIS — G8191 Hemiplegia, unspecified affecting right dominant side: Secondary | ICD-10-CM

## 2020-08-06 LAB — COMPREHENSIVE METABOLIC PANEL
ALT: 34 U/L (ref 0–44)
AST: 36 U/L (ref 15–41)
Albumin: 3.1 g/dL — ABNORMAL LOW (ref 3.5–5.0)
Alkaline Phosphatase: 61 U/L (ref 38–126)
Anion gap: 10 (ref 5–15)
BUN: 10 mg/dL (ref 8–23)
CO2: 23 mmol/L (ref 22–32)
Calcium: 8.5 mg/dL — ABNORMAL LOW (ref 8.9–10.3)
Chloride: 104 mmol/L (ref 98–111)
Creatinine, Ser: 0.6 mg/dL — ABNORMAL LOW (ref 0.61–1.24)
GFR calc Af Amer: >60 mL/min (ref >60)
GFR calc non Af Amer: >60 mL/min (ref >60)
Glucose, Bld: 107 mg/dL — ABNORMAL HIGH (ref 70–99)
Potassium: 3.7 mmol/L (ref 3.5–5.1)
Sodium: 137 mmol/L (ref 135–145)
Total Bilirubin: 1.3 mg/dL — ABNORMAL HIGH (ref 0.3–1.2)
Total Protein: 6.7 g/dL (ref 6.5–8.1)

## 2020-08-06 LAB — CBC WITH DIFFERENTIAL/PLATELET
Abs Immature Granulocytes: 0.02 10*3/uL (ref 0.00–0.07)
Basophils Absolute: 0.1 10*3/uL (ref 0.0–0.1)
Basophils Relative: 1 %
Eosinophils Absolute: 0.4 10*3/uL (ref 0.0–0.5)
Eosinophils Relative: 5 %
HCT: 35.6 % — ABNORMAL LOW (ref 39.0–52.0)
Hemoglobin: 9.9 g/dL — ABNORMAL LOW (ref 13.0–17.0)
Immature Granulocytes: 0 %
Lymphocytes Relative: 14 %
Lymphs Abs: 1 10*3/uL (ref 0.7–4.0)
MCH: 20.2 pg — ABNORMAL LOW (ref 26.0–34.0)
MCHC: 27.8 g/dL — ABNORMAL LOW (ref 30.0–36.0)
MCV: 72.8 fL — ABNORMAL LOW (ref 80.0–100.0)
Monocytes Absolute: 0.5 10*3/uL (ref 0.1–1.0)
Monocytes Relative: 7 %
Neutro Abs: 5.1 10*3/uL (ref 1.7–7.7)
Neutrophils Relative %: 73 %
Platelets: 214 10*3/uL (ref 150–400)
RBC: 4.89 MIL/uL (ref 4.22–5.81)
RDW: 18.6 % — ABNORMAL HIGH (ref 11.5–15.5)
WBC: 7 10*3/uL (ref 4.0–10.5)
nRBC: 0 % (ref 0.0–0.2)

## 2020-08-06 NOTE — Progress Notes (Signed)
Inpatient Rehabilitation  Patient information reviewed and entered into eRehab system by Cam Harnden M. Oluwadara Gorman, M.A., CCC/SLP, PPS Coordinator.  Information including medical coding, functional ability and quality indicators will be reviewed and updated through discharge.    

## 2020-08-06 NOTE — Progress Notes (Signed)
Rounded on pt with dayshift nurse. Pt c/o of abdominal pain, stated onset was 'a while ago.' Pt denied any constipation. Dayshift nurse previously offered PRN bowel med but pt refused, although LBM per pt was 8/10. This nurse again offered pt bowel meds but pt refused, stating that he only wanted PRN tylenol for the pain. When this nurse rounded on pt for his evening meds, pt was having a BM. Pt stated that he no longer had pain after the BM and no didn't need PRN meds. Nurse educated on importance of regular bowel emptying and BM meds.

## 2020-08-06 NOTE — Progress Notes (Signed)
[facility] PHYSICAL MEDICINE & REHABILITATION PROGRESS NOTE  Subjective/Complaints: Patient seen laying in bed this AM.  He states he slept well overnight.  He is ready to being therapies.   ROS: Denies CP, SOB, N/V/D  Objective: Vital Signs: Blood pressure (!) 153/65, pulse 76, temperature (!) 97.5 F (36.4 C), temperature source Oral, resp. rate 13, SpO2 98 %. ECHOCARDIOGRAM COMPLETE  Result Date: 08/04/2020    ECHOCARDIOGRAM REPORT   Patient Name:   Nathan Hart [facility] Date of Exam: 08/04/2020 Medical Rec #:  [id number]          Height:       66.0 in Accession #:    [id number]         Weight:       170.0 lb Date of Birth:  [birth year]          BSA:          1.866 m Patient Age:    64 years           BP:           154/84 mmHg Patient Gender: M                  HR:           88 bpm. Exam Location:  Inpatient Procedure: 2D Echo and Intracardiac Opacification Agent Indications:    Stroke 434.91 / I163.9  History:        Patient has prior history of Echocardiogram examinations, most                 recent 09/08/2019. Carotid Disease and COPD; Risk                 Factors:Hypertension, Dyslipidemia and Current Smoker.  Sonographer:    Darlina Sicilian RDCS Referring Phys: [id number] CHRISTOPHER P DANFORD IMPRESSIONS  1. Left ventricular ejection fraction, by estimation, is 55 to 60%. The left ventricle has normal function. The left ventricle demonstrates regional wall motion abnormalities with possible basal inferior hypokinesis. There is mild left ventricular hypertrophy. Left ventricular diastolic parameters are consistent with Grade I diastolic dysfunction (impaired relaxation).  2. Right ventricular systolic function is normal. The right ventricular size is normal. Tricuspid regurgitation signal is inadequate for assessing PA pressure.  3. The mitral valve is normal in structure. No evidence of mitral valve regurgitation. No evidence of mitral stenosis.  4. The aortic valve is tricuspid. Aortic valve  regurgitation is not visualized. No aortic stenosis is present.  5. The inferior vena cava is normal in size with greater than 50% respiratory variability, suggesting right atrial pressure of 3 mmHg.  6. Technically difficult study with poor acoustic windows. FINDINGS  Left Ventricle: Left ventricular ejection fraction, by estimation, is 55 to 60%. The left ventricle has normal function. The left ventricle demonstrates regional wall motion abnormalities. Definity contrast agent was given IV to delineate the left ventricular endocardial borders. The left ventricular internal cavity size was normal in size. There is mild left ventricular hypertrophy. Left ventricular diastolic parameters are consistent with Grade I diastolic dysfunction (impaired relaxation). Right Ventricle: The right ventricular size is normal. No increase in right ventricular wall thickness. Right ventricular systolic function is normal. Tricuspid regurgitation signal is inadequate for assessing PA pressure. Left Atrium: Left atrial size was normal in size. Right Atrium: Right atrial size was normal in size. Pericardium: There is no evidence of pericardial effusion. Mitral Valve: The mitral valve is normal in structure. There is mild thickening  of the mitral valve leaflet(s). There is moderate calcification of the mitral valve leaflet(s). No evidence of mitral valve regurgitation. No evidence of mitral valve stenosis. Tricuspid Valve: The tricuspid valve is normal in structure. Tricuspid valve regurgitation is not demonstrated. Aortic Valve: The aortic valve is tricuspid. Aortic valve regurgitation is not visualized. No aortic stenosis is present. Pulmonic Valve: The pulmonic valve was normal in structure. Pulmonic valve regurgitation is not visualized. Aorta: The aortic root is normal in size and structure. Venous: The inferior vena cava is normal in size with greater than 50% respiratory variability, suggesting right atrial pressure of 3 mmHg.  IAS/Shunts: No atrial level shunt detected by color flow Doppler.  LEFT VENTRICLE PLAX 2D LVIDd:         3.80 cm  Diastology LVIDs:         2.90 cm  LV e' lateral:   6.22 cm/s LV PW:         1.10 cm  LV E/e' lateral: 8.3 LV IVS:        1.40 cm  LV e' medial:    6.57 cm/s LVOT diam:     2.10 cm  LV E/e' medial:  7.9 LV SV:         46 LV SV Index:   25 LVOT Area:     3.46 cm  LEFT ATRIUM             Index LA diam:        2.90 cm 1.55 cm/m LA Vol (A2C):   36.8 ml 19.72 ml/m LA Vol (A4C):   31.5 ml 16.88 ml/m LA Biplane Vol: 36.6 ml 19.61 ml/m  AORTIC VALVE LVOT Vmax:   76.90 cm/s LVOT Vmean:  49.100 cm/s LVOT VTI:    0.133 m  AORTA Ao Root diam: 3.20 cm MITRAL VALVE MV Area (PHT): 3.72 cm    SHUNTS MV Decel Time: 204 msec    Systemic VTI:  0.13 m MV E velocity: 51.90 cm/s  Systemic Diam: 2.10 cm MV A velocity: 86.80 cm/s MV E/A ratio:  0.60 Loralie Champagne MD Electronically signed by Loralie Champagne MD Signature Date/Time: 08/04/2020/4:48:24 PM    Final    Recent Labs    08/06/20 0640  WBC 7.0  HGB 9.9*  HCT 35.6*  PLT 214   Recent Labs    08/05/20 0141 08/06/20 0640  NA 136 137  K 3.6 3.7  CL 103 104  CO2 23 23  GLUCOSE 103* 107*  BUN 9 10  CREATININE 0.72 0.60*  CALCIUM 8.3* 8.5*    Physical Exam: BP (!) 153/65 (BP Location: Right Arm)    Pulse 76    Temp (!) 97.5 F (36.4 C) (Oral)    Resp 13    SpO2 98%  Constitutional: No distress . Vital signs reviewed. HENT: Normocephalic.  Atraumatic. Eyes: Difficulty tracking. No discharge. Cardiovascular: No JVD. RRR.  Respiratory: Normal effort.  No stridor.  Bilateral clear to auscultation. GI: Non-distended.  BS +. Skin: Warm and dry.  Intact. Psych: Normal mood.  Normal behavior. Musc: No edema in extremities.  No tenderness in extremities. Neuro: Alert Motor: LUE: 5/5 proximal distal RUE: 4/5 proximal distal LLE: 5/5 proximal distal RLE: 4+/5 proximal and distal  Assessment/Plan: 1. Functional deficits secondary to left  posterior MCA/PCA infarcts which require 3+ hours per day of interdisciplinary therapy in a comprehensive inpatient rehab setting.  Physiatrist is providing close team supervision and 24 hour management of active medical problems listed below.  Physiatrist and rehab team continue to assess barriers to discharge/monitor patient progress toward functional and medical goals  Care Tool:  Bathing              Bathing assist       Upper Body Dressing/Undressing Upper body dressing        Upper body assist      Lower Body Dressing/Undressing Lower body dressing            Lower body assist       Toileting Toileting    Toileting assist       Transfers Chair/bed transfer  Transfers assist           Locomotion Ambulation   Ambulation assist              Walk 10 feet activity   Assist           Walk 50 feet activity   Assist           Walk 150 feet activity   Assist           Walk 10 feet on uneven surface  activity   Assist           Wheelchair     Assist               Wheelchair 50 feet with 2 turns activity    Assist            Wheelchair 150 feet activity     Assist            Medical Problem List and Plan: 1.  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  Begin CIR evaluations 2.  Antithrombotics: -DVT/anticoagulation: Lovenox             -antiplatelet therapy: Aspirin 325 mg daily and Plavix 75 mg daily x3 months then aspirin alone 3. Pain Management: Tylenol as needed 4. Mood: Provide emotional support             -antipsychotic agents: N/A 5. Neuropsych/dementia: This patient is  not capable of making decisions on his own behalf. 6. Skin/Wound Care: Routine skin checks 7. Fluids/Electrolytes/Nutrition: Routine in and  outs. 8.  Hypertension.  Coreg 3.125 mg twice daily.    Monitor with increased mobility 9.  COPD.  Oxygen dependent.  Check oxygen saturations every shift 10.  Legally blind bilaterally.  Patient has assistance of son at home- due to macular degeneration 11.  History of tobacco and alcohol use.  Provide counseling 12.  Hyperlipidemia.    Continue Lipitor 13.  Questionable medical compliance.  Provide counseling with patient and family. 14.  Anemia  Hemoglobin 9.9 on 8/13, labs ordered for Monday  LOS: 1 days A FACE TO FACE EVALUATION WAS PERFORMED  Servando Kyllonen Lorie Phenix 08/06/2020, 10:16 AM

## 2020-08-06 NOTE — Progress Notes (Signed)
[facility] Individual Statement of Services  Patient Name:  Nathan Hart  Date:  08/06/2020  Welcome to the [facility].  Our goal is to provide you with an individualized program based on your diagnosis and situation, designed to meet your specific needs.  With this comprehensive rehabilitation program, you will be expected to participate in at least 3 hours of rehabilitation therapies Monday-Friday, with modified therapy programming on the weekends.  Your rehabilitation program will include the following services:  Physical Therapy (PT), Occupational Therapy (OT), Speech Therapy (ST), 24 hour per day rehabilitation nursing, Therapeutic Recreaction (TR), Neuropsychology, Care Coordinator, Rehabilitation Medicine, Nutrition Services, Pharmacy Services and Other  Weekly team conferences will be held on Wednesdays to discuss your progress.  Your Inpatient Rehabilitation Care Coordinator will talk with you frequently to get your input and to update you on team discussions.  Team conferences with you and your family in attendance may also be held.  Expected length of stay: 5-8 Days  Overall anticipated outcome: Supervision   Depending on your progress and recovery, your program may change. Your Inpatient Rehabilitation Care Coordinator will coordinate services and will keep you informed of any changes. Your Inpatient Rehabilitation Care Coordinator's name and contact numbers are listed  below.  The following services may also be recommended but are not provided by the [facility]:    [place] will be made to provide these services after discharge if needed.  Arrangements include referral to agencies that provide these services.  Your insurance has been verified to be:  Medicare Your primary doctor is:  NO PCP  Pertinent information will be shared  with your doctor and your insurance company.  Inpatient Rehabilitation Care Coordinator:  Erlene Quan, [place] or [phone]  Information discussed with and copy given to patient by: Dyanne Iha, 08/06/2020, 12:26 PM

## 2020-08-06 NOTE — Evaluation (Signed)
Speech Language Pathology Assessment and Plan  Patient Details  Name: Nathan Hart MRN: [id number] Date of Birth: 1956-05-25  SLP Diagnosis: Cognitive Impairments;Speech and Language deficits  Rehab Potential: Good ELOS: 7-10 days    Today's Date: 08/06/2020 SLP Individual Time:   0800-0900   Hospital Problem: Principal Problem:   Left middle cerebral artery stroke [facility]) Active Problems:   Anemia   Dyslipidemia   Right hemiparesis ([place])   Dementia without behavioral disturbance ([place])  Past Medical History:  Past Medical History:  Diagnosis Date  . Abnormal peripheral vision of left eye   . Blind right eye   . COPD (chronic obstructive pulmonary disease) (HCC)     home o2 hs and prn  . Dyspnea    with activity  . Empyema lung ([place])   . ETOH abuse   . HTN (hypertension)   . Peripheral vascular disease ([place])   . Pneumonia 2020  . Stroke [facility])    Past Surgical History:  Past Surgical History:  Procedure Laterality Date  . BACK SURGERY     disc lumar x 2   . CLAVICLE SURGERY Right   . IR ANGIO INTRA EXTRACRAN SEL COM CAROTID INNOMINATE BILAT MOD SED  09/12/2019  . IR ANGIO INTRA EXTRACRAN SEL COM CAROTID INNOMINATE BILAT MOD SED  08/03/2020  . IR ANGIO VERTEBRAL SEL SUBCLAVIAN INNOMINATE UNI L MOD SED  09/12/2019  . IR ANGIO VERTEBRAL SEL VERTEBRAL UNI R MOD SED  09/12/2019  . IR ANGIO VERTEBRAL SEL VERTEBRAL UNI R MOD SED  08/03/2020  . IR ANGIOGRAM EXTREMITY LEFT  08/03/2020  . IR INTRAVSC STENT CERV CAROTID W/EMB-PROT MOD SED INCL ANGIO  09/24/2019  . IR US GUIDE VASC ACCESS RIGHT  09/12/2019  . IR US GUIDE VASC ACCESS RIGHT  08/03/2020  . KNEE SURGERY Right     x 3 ACL  . RADIOLOGY WITH ANESTHESIA N/A 09/24/2019   Procedure: IR WITH ANESTHESIA/STENTING;  Surgeon: Luanne Bras, MD;  Location: [facility];  Service: Radiology;  Laterality: N/A;  . [place], [place]    Assessment / Plan / Recommendation Clinical Impression   Nathan Hart is a  64 year old right-handed male with history of hypertension, CVA, right carotid stenting September 2020, COPD with home oxygen, legally blind bilaterally, smoker with alcohol use as well as questionable medical compliance. History taken from chart review and patient. Patient lives with son and family. Presented 08/02/2020 with right side weakness numbness and hemiparesis. Cranial CT scan showed acute left parieto-occipital sequela of remote right MCA territory left basal ganglia insult. 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 Right ICA stent was patent. Patient did not receive TPA. MRI acute infarct left posterior greater than anterior circulation is noted presence of a fetal left PCA on the prior CTA no significant mass-effect. There was a minor petechial hemorrhage. Admission chemistries glucose 131 hemoglobin 11 alcohol negative urine drug screen negative urinalysis negative nitrite. Currently maintained on aspirin Plavix for CVA prophylaxis x3 months then aspirin alone. Subcutaneous Lovenox for DVT prophylaxis Tolerating a regular diet  Patient transferred to CIR on 08/05/2020 .  Patient was evaluated by SLP for dysphagia and cognitive-linguistic abilities. As per BSE, patient is exhibiting swallow function that is WNL and without any overt s/s of aspiration or penetration. Swallow initiation was timely and pharyngeal contraction and laryngeal elevation were both WFL.   Patient was  evaluated for cognitive-linguistic abilities and is currently exhibiting a mod-severe cognitive impairment and min-mod language impairment. He was oriented to name, DOB, but not age, place, time or situation. He would quickly answer "I dont know" when asked questions. He was in average range for the following subtests from Cognistat: Attention, Repetition, and 1-2 step commands following. He did not elaborate on responses of  hypothetical problems, such as What would you do if you saw a 64 year old alone at the end of a pier, to which he responded, "try to stop her". He was not able to tell clinician what type of work he used to do, only that he was retired. He recalled 1/4 recall words after 5 minutes with 3 choice cue. Patient's overall affect was flat and he did not spontaneously comment or request and only verbalized when responding to question posed by SLP. When asked what patient did as far as activity level, he said he would pretty much sit and listen to TV (he is legally blind) and would only get up for bathroom, etc. Patient's current cognitive-linguistic impairment significantly impacts his ability to perform ADL's safely and he will benefit from skilled ST treatment in order to improve his overall cognitive-linguistic function prior to discharge home with 24 hour supervision.    Skilled Therapeutic Interventions          Bedside swallow evaluation, speech-language and cognitive evaluation (portions of Cognistat)  SLP Assessment  Patient will need skilled Speech Lanaguage Pathology Services during CIR admission    Recommendations  SLP Diet Recommendations: Age appropriate regular solids;Thin Liquid Administration via: Cup;Straw Medication Administration: Whole meds with liquid Supervision: Staff to assist with self feeding;Full supervision/cueing for compensatory strategies (patient requires assist due to visual impairment (blind bilaterally)) Compensations: Minimize environmental distractions Oral Care Recommendations: Oral care BID Patient destination: Home Follow up Recommendations: Other (comment) (TBD) Equipment Recommended: None recommended by SLP    SLP Frequency 3 to 5 out of 7 days   SLP Duration  SLP Intensity  SLP Treatment/Interventions 7-10 days  Minumum of 1-2 x/day, 30 to 90 minutes  Cognitive remediation/compensation;Speech/Language facilitation;Environmental controls;Patient/family  education;Functional tasks;Cueing hierarchy    Pain    Prior Functioning Type of Home: House  Lives With: Son Nathan Hart) Available Help at Discharge: Family;Available 24 hours/day  SLP Evaluation Cognition Arousal/Alertness: Awake/alert Immediate Memory Recall: Sock;Blue;Bed Memory Recall Sock: Not able to recall Memory Recall Blue: With Cue Memory Recall Bed: Not able to recall Problem Solving: Impaired Behaviors: Poor frustration tolerance  Comprehension   Expression Written Expression Dominant Hand: Left (pt reports he "used to be" right handed before CVA, unable to tell therapist if hemiparesis was from prior CVA or the CVA in which he is currently being hospitalized) Oral Motor    Care Tool Care Tool Cognition Expression of Ideas and Wants Expression of Ideas and Wants: Some difficulty - exhibits some difficulty with expressing needs and ideas (e.g, some words or finishing thoughts) or speech is not clear   Understanding Verbal and Non-Verbal Content Understanding Verbal and Non-Verbal Content: Sometimes understands - understands only basic conversations or simple, direct phrases. Frequently requires cues to understand   Memory/Recall Ability *first 3 days only Memory/Recall Ability *first 3 days only: That he or she is in a hospital/hospital unit     PMSV Assessment  PMSV Trial    Bedside Swallowing Assessment General    Oral Care Assessment   Ice Chips   Thin Liquid   Nectar Thick   Honey Thick  Puree Puree: Not tested Solid Solid: Not tested BSE Assessment Risk for Aspiration Impact on safety and function: No limitations Other Related Risk Factors: Cognitive impairment  Short Term Goals: Week 1: SLP Short Term Goal 1 (Week 1): Patient will demonstrate problem solving for functional tasks (locating utensils, cups on tray, etc) with mod A. SLP Short Term Goal 2 (Week 1): Patient will be able to answer basic level hypothetical problem solving and  reasoning questions with mod A to elaborate. SLP Short Term Goal 3 (Week 1): Patient will be able to describe activities he has completed in therapies (PT, OT, ST) with mod A cues to recall. SLP Short Term Goal 4 (Week 1): Patient will demonstrate adequate orientation to place, situation,approximate time of day with mod A. SLP Short Term Goal 5 (Week 1): Patient will demonstrate adequate safety awareness when performing functional problem solving tasks with mod A.  Refer to Care Plan for Long Term Goals  Recommendations for other services: Neuropsych  Discharge Criteria: Patient will be discharged from SLP if patient refuses treatment 3 consecutive times without medical reason, if treatment goals not met, if there is a change in medical status, if patient makes no progress towards goals or if patient is discharged from hospital.  The above assessment, treatment plan, treatment alternatives and goals were discussed and mutually agreed upon: No family available/patient unable   Sonia Baller, [name], [place] 08/06/20 4:17 PM

## 2020-08-06 NOTE — Progress Notes (Signed)
Patient ID: Nathan Hart, male   DOB: 09/26/1956, 64 y.o.   MRN: [id number]   Spoke with pt son [name] would like to remind everyone that pt is blind in his R eye. Pt can see shadows and figures in L eye. Pt may not be able to use call button. Will continue to follow up with pt.

## 2020-08-06 NOTE — Evaluation (Signed)
Occupational Therapy Assessment and Plan  Patient Details  Name: Nathan Hart MRN: [id number] Date of Birth: 12-05-1956  OT Diagnosis: blindness and low vision, cognitive deficits, hemiplegia affecting dominant side, muscle weakness (generalized) and swelling of limb Rehab Potential: Rehab Potential (ACUTE ONLY): Good ELOS: 8-10 days   Today's Date: 08/06/2020 OT Individual Time: 7893-8101 OT Individual Time Calculation (min): 74 min     Hospital Problem: Principal Problem:   Left middle cerebral artery stroke ([place]) Active Problems:   Anemia   Dyslipidemia   Right hemiparesis ([place])   Dementia without behavioral disturbance ([place])   Past Medical History:  Past Medical History:  Diagnosis Date  . Abnormal peripheral vision of left eye   . Blind right eye   . COPD (chronic obstructive pulmonary disease) (HCC)     home o2 hs and prn  . Dyspnea    with activity  . Empyema lung ([place])   . ETOH abuse   . HTN (hypertension)   . Peripheral vascular disease ([place])   . Pneumonia 2020  . Stroke [facility])    Past Surgical History:  Past Surgical History:  Procedure Laterality Date  . BACK SURGERY     disc lumar x 2   . CLAVICLE SURGERY Right   . IR ANGIO INTRA EXTRACRAN SEL COM CAROTID INNOMINATE BILAT MOD SED  09/12/2019  . IR ANGIO INTRA EXTRACRAN SEL COM CAROTID INNOMINATE BILAT MOD SED  08/03/2020  . IR ANGIO VERTEBRAL SEL SUBCLAVIAN INNOMINATE UNI L MOD SED  09/12/2019  . IR ANGIO VERTEBRAL SEL VERTEBRAL UNI R MOD SED  09/12/2019  . IR ANGIO VERTEBRAL SEL VERTEBRAL UNI R MOD SED  08/03/2020  . IR ANGIOGRAM EXTREMITY LEFT  08/03/2020  . IR INTRAVSC STENT CERV CAROTID W/EMB-PROT MOD SED INCL ANGIO  09/24/2019  . IR US GUIDE VASC ACCESS RIGHT  09/12/2019  . IR US GUIDE VASC ACCESS RIGHT  08/03/2020  . KNEE SURGERY Right     x 3 ACL  . RADIOLOGY WITH ANESTHESIA N/A 09/24/2019   Procedure: IR WITH ANESTHESIA/STENTING;  Surgeon: Luanne Bras, MD;  Location: [place];  Service:  Radiology;  Laterality: N/A;  . TALC PLEURODESIS     [place], [place]    Assessment & Plan Clinical Impression: Nathan Hart is a 64 year old right-handed male with history of hypertension, CVA, right carotid stenting September 2020, COPD with home oxygen, legally blind bilaterally, smoker with alcohol use as well as questionable medical compliance.  History taken from chart review and patient.  Patient lives with son and family.  Presented 08/02/2020 with right side weakness numbness and hemiparesis.  Cranial CT scan showed acute left parieto-occipital sequela of remote right MCA territory left basal ganglia insult.  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  Right ICA stent was patent.  Patient did not receive TPA.  MRI acute infarct left posterior greater than anterior circulation is noted presence of a fetal left PCA on the prior CTA no significant mass-effect.  There was a minor petechial hemorrhage.  Admission chemistries glucose 131 hemoglobin 11 alcohol negative urine drug screen negative urinalysis negative nitrite.  Currently maintained on aspirin Plavix for CVA prophylaxis x3 months then aspirin alone.  Subcutaneous Lovenox for DVT prophylaxis   Tolerating a regular diet.  Therapy evaluations completed and patient was admitted for a comprehensive rehab program.    Patient currently requires Mod-Max with basic self-care skills  secondary to muscle weakness and muscle joint tightness, decreased cardiorespiratoy endurance, abnormal tone, decreased coordination and decreased motor planning, blindness, decreased attention to right, decreased initiation, decreased problem solving and decreased safety awareness and decreased sitting balance, decreased standing balance, decreased postural control and hemiplegia.  Prior to hospitalization, patient could complete BADLs with independent .  Patient will benefit from  skilled intervention to increase independence with basic self-care skills prior to discharge home with family.  Anticipate patient will require minimal physical assistance and follow up home health.  OT - End of Session Endurance Deficit: Yes Endurance Deficit Description: Pt requiring several seated rest breaks during activity with SOB, 02 sats 90-96% on RA OT Assessment Rehab Potential (ACUTE ONLY): Good OT Barriers to Discharge: Other (comments) (n/a) OT Patient demonstrates impairments in the following area(s): Balance;Perception;Safety;Cognition;Edema;Vision;Endurance;Motor;Pain OT Basic ADL's Functional Problem(s): Grooming;Bathing;Dressing;Toileting OT Advanced ADL's Functional Problem(s): Simple Meal Preparation OT Transfers Functional Problem(s): Toilet;Tub/Shower OT Additional Impairment(s): None;Fuctional Use of Upper Extremity OT Plan OT Intensity: Minimum of 1-2 x/day, 45 to 90 minutes OT Frequency: 5 out of 7 days OT Duration/Estimated Length of Stay: 8-10 days OT Treatment/Interventions: Balance/vestibular training;Discharge planning;Functional electrical stimulation;Self Care/advanced ADL retraining;Pain management;Therapeutic Activities;UE/LE Coordination activities;Visual/perceptual remediation/compensation;Therapeutic Exercise;Patient/family education;Functional mobility training;Disease mangement/prevention;Cognitive remediation/compensation;Community reintegration;DME/adaptive equipment instruction;Neuromuscular re-education;Psychosocial support;UE/LE Strength taining/ROM;Splinting/orthotics OT Self Feeding Anticipated Outcome(s): Min A OT Basic Self-Care Anticipated Outcome(s): CGA-Min A OT Toileting Anticipated Outcome(s): Min A OT Bathroom Transfers Anticipated Outcome(s): Supervision OT Recommendation Recommendations for Other Services: Neuropsych consult Patient destination: Home Follow Up Recommendations: Home health OT Equipment Recommended: To be  determined  OT Evaluation Precautions/Restrictions  Precautions Precautions: Fall Precaution Comments: legally blind Vital Signs Therapy Vitals Temp: 98.4 F (36.9 C) Temp Source: Oral Pulse Rate: 80 Resp: 16 BP: (!) 151/77 Patient Position (if appropriate): Lying Oxygen Therapy SpO2: 95 % O2 Device: Room Air Pain: in elbow, used position strategies at end of session to address therapeutically   Home Living/Prior [place] expects to be discharged to:: Private residence Living Arrangements: Other (Comment) (Lives with son and daughter in [place]) Available Help at Discharge: Family, Available 24 hours/day Type of Home: House Home Layout: One level Bathroom Shower/Tub: Tub/shower unit, Architectural technologist: Standard Bathroom Accessibility: Yes Additional Comments: All PLOF information obtained from chart as pt is a poor historian  Lives With: Son [name] IADL History Education: high school Occupation: Other (comment) Type of Occupation: Pt responding "I don't know" Leisure and Hobbies: Pt unable to state at start of session, near end of session pt reported he liked to "work on cars" in the past Prior Function Level of Independence: Independent with basic ADLs (per pt, question accuracy due to poor historian and no family member present during session) Meal Prep: Total  Able to Take Stairs?: Yes Driving: No Vocation: On disability Vision Baseline Vision/History: Legally blind Patient Visual Report: No change from baseline Perception  Perception: Impaired Inattention/Neglect: Does not attend to right visual field Praxis Praxis: Impaired Praxis Impairment Details: Initiation;Motor planning Cognition Overall Cognitive Status: Impaired/Different from baseline Arousal/Alertness: Awake/alert Orientation Level: Person;Situation (only oriented to place when given option of 2) Year: 2021 Month: February Day of Week: Incorrect Memory:  Impaired Memory Impairment: Storage deficit;Retrieval deficit;Decreased recall of new information;Decreased long term memory Decreased Long Term Memory: Verbal basic Immediate Memory Recall: Sock;Blue;Bed Memory Recall Sock: Not able to recall Memory Recall Blue: With Cue Problem Solving: Impaired Behaviors: Poor frustration tolerance Safety/Judgment: Impaired Sensation Sensation Light Touch: Appears Intact Coordination Gross Motor Movements are Fluid  and Coordinated: No Fine Motor Movements are Fluid and Coordinated: No Coordination and Movement Description: Rt hemiplegia, Rt UE>LE Finger Nose Finger Test: Difficult to complete bilaterally due to blindness, dysmetria Rt>Lt Motor  Motor Motor: Hemiplegia;Abnormal postural alignment and control  Trunk/Postural Assessment  Cervical Assessment Cervical Assessment: Within Functional Limits Thoracic Assessment Thoracic Assessment: Exceptions to [facility] (Lt shoulder elevation) Lumbar Assessment Lumbar Assessment: Within Functional Limits Postural Control Postural Control: Deficits on evaluation (Mild Rt posterior lean in sitting/standing)  Balance Balance Balance Assessed: Yes Dynamic Sitting Balance Dynamic Sitting - Balance Support: No upper extremity supported;During functional activity (Min A to wash feet bilaterally) Dynamic Standing Balance Dynamic Standing - Balance Support: No upper extremity supported;During functional activity Dynamic Standing - Level of Assistance: 4: Min assist (LB dressing tasks) Dynamic Standing - Balance Activities: Lateral lean/weight shifting;Forward lean/weight shifting Extremity/Trunk Assessment RUE Assessment RUE Assessment:  (edematous) RUE Body System: Neuro Brunstrum levels for arm and hand: Arm;Hand Brunstrum level for arm: Stage III Synergy is performed voluntarily Brunstrum level for hand: Stage IV Movements deviating from synergies RUE Tone RUE Tone: Hypertonic;Mild;Moderate Hypertonic  Details: moderate hypertonicity at the elbow, mild at digits, shoulder internally rotated LUE Assessment LUE Assessment: Within Functional Limits General Strength Comments: 4+/5 grossly  Care Tool Care Tool Self Care Eating  not assessed      Oral Care  Oral care, brush teeth, clean dentures activity did not occur: Refused      Bathing   Body parts bathed by patient: Right arm;Chest;Abdomen;Front perineal area;Buttocks;Right upper leg;Left upper leg;Right lower leg;Left lower leg;Face Body parts bathed by helper: Left arm   Assist Level: Minimal Assistance - Patient > 75%    Upper Body Dressing(including orthotics)   What is the patient wearing?: Pull over shirt   Assist Level: Moderate Assistance - Patient 50 - 74%    Lower Body Dressing (excluding footwear)   What is the patient wearing?: Incontinence brief;Pants Assist for lower body dressing: Moderate Assistance - Patient 50 - 74%    Putting on/Taking off footwear   What is the patient wearing?: Non-skid slipper socks;Ted hose Assist for footwear: Maximal Assistance - Patient 25 - 49%       Care Tool Toileting Toileting activity Toileting Activity did not occur (Clothing management and hygiene only): N/A (no void or bm)       Care Tool Bed Mobility Roll left and right activity   Roll left and right assist level: Supervision/Verbal cueing    Sit to lying activity   Sit to lying assist level: Supervision/Verbal cueing    Lying to sitting edge of bed activity   Lying to sitting edge of bed assist level: Supervision/Verbal cueing     Care Tool Transfers Sit to stand transfer   Sit to stand assist level: Contact Guard/Touching assist    Chair/bed transfer   Chair/bed transfer assist level: Contact Guard/Touching assist     Toilet transfer   Assist Level: Contact Guard/Touching assist     Care Tool Cognition Expression of Ideas and Wants Expression of Ideas and Wants: Some difficulty - exhibits some  difficulty with expressing needs and ideas (e.g, some words or finishing thoughts) or speech is not clear   Understanding Verbal and Non-Verbal Content Understanding Verbal and Non-Verbal Content: Sometimes understands - understands only basic conversations or simple, direct phrases. Frequently requires cues to understand   Memory/Recall Ability *first 3 days only Memory/Recall Ability *first 3 days only: That he or she is in a hospital/hospital unit  Refer to Care Plan for Long Term Goals  SHORT TERM GOAL WEEK 1 OT Short Term Goal 1 (Week 1): STGs=LTGs due to ELOS  Recommendations for other services: Neuropsych   Skilled Therapeutic Intervention Skilled OT session completed with focus on initial evaluation, education on OT role/POC, and establishment of patient-centered goals.   Pt greeted in bed with no c/o pain, agreeable to complete bathing/dressing tasks while seated EOB at sit<stand level without AD. Supine<sit from flat bed without bedrails completed with supervision assist. Pt exhibited mild Rt posterior lean while sitting while bilaterally engaged in ADL tasks, CGA-Min A for dynamic balance, a few small LOBs when laterally leaning towards Rt. Min A to use the Rt hand to wash his Lt side. Noted Rt inattention as well, pt able to distinguish shapes/borders visually, reached for trousers placed on his bedside table (on the Lt side), also able to find a few grooming items/drink cups on his Lt side. Pt needing max cuing when items were placed on his Rt and he was encouraged to scan. CGA for sit<stand and for dynamic balance. Max A for LB dressing due to limited functional use of the Rt hand, pt able to grip and lift brief on his Rt side with a great deal of effort, needing assistance to elevate and fasten his pants. Simulated BSC transfer completed via stand pivot with CGA no AD. Pt reported feeling very fatigued at this point and requested to return to bed vs transfer to recliner. Left him  with all needs within reach and bed alarm set, notified RN that he will need a soft call bell ordered for room use.    Pt with s/s SOB during activity, 02 sats monitored with range of 90-96% on RA during activity/rest  ADL ADL Eating: Not assessed Grooming: Moderate assistance Upper Body Bathing: Moderate assistance Where Assessed-Upper Body Bathing: Edge of bed Lower Body Bathing: Contact guard Where Assessed-Lower Body Bathing: Edge of bed Upper Body Dressing: Moderate assistance Where Assessed-Upper Body Dressing: Edge of bed Lower Body Dressing: Maximal assistance Where Assessed-Lower Body Dressing: Edge of bed Toileting: Not assessed Toilet Transfer: Therapist, music Method: Arts development officer: Radiographer, therapeutic: Not assessed Mobility   CGA stand pivot BSC transfer without AD   Discharge Criteria: Patient will be discharged from OT if patient refuses treatment 3 consecutive times without medical reason, if treatment goals not met, if there is a change in medical status, if patient makes no progress towards goals or if patient is discharged from hospital.  The above assessment, treatment plan, treatment alternatives and goals were discussed and mutually agreed upon: by patient  Skeet Simmer 08/06/2020, 4:11 PM

## 2020-08-06 NOTE — Progress Notes (Signed)
Patient Details  Name: Nathan Hart MRN: [id number] Date of Birth: 04-11-1956  Today's Date: 08/06/2020  Hospital Problems: Principal Problem:   Left middle cerebral artery stroke [facility]) Active Problems:   Anemia   Dyslipidemia   Right hemiparesis ([place])   Dementia without behavioral disturbance ([place])  Past Medical History:  Past Medical History:  Diagnosis Date  . Abnormal peripheral vision of left eye   . Blind right eye   . COPD (chronic obstructive pulmonary disease) (HCC)     home o2 hs and prn  . Dyspnea    with activity  . Empyema lung ([place])   . ETOH abuse   . HTN (hypertension)   . Peripheral vascular disease ([place])   . Pneumonia 2020  . Stroke [facility])    Past Surgical History:  Past Surgical History:  Procedure Laterality Date  . BACK SURGERY     disc lumar x 2   . CLAVICLE SURGERY Right   . IR ANGIO INTRA EXTRACRAN SEL COM CAROTID INNOMINATE BILAT MOD SED  09/12/2019  . IR ANGIO INTRA EXTRACRAN SEL COM CAROTID INNOMINATE BILAT MOD SED  08/03/2020  . IR ANGIO VERTEBRAL SEL SUBCLAVIAN INNOMINATE UNI L MOD SED  09/12/2019  . IR ANGIO VERTEBRAL SEL VERTEBRAL UNI R MOD SED  09/12/2019  . IR ANGIO VERTEBRAL SEL VERTEBRAL UNI R MOD SED  08/03/2020  . IR ANGIOGRAM EXTREMITY LEFT  08/03/2020  . IR INTRAVSC STENT CERV CAROTID W/EMB-PROT MOD SED INCL ANGIO  09/24/2019  . IR US GUIDE VASC ACCESS RIGHT  09/12/2019  . IR US GUIDE VASC ACCESS RIGHT  08/03/2020  . KNEE SURGERY Right     x 3 ACL  . RADIOLOGY WITH ANESTHESIA N/A 09/24/2019   Procedure: IR WITH ANESTHESIA/STENTING;  Surgeon: Luanne Bras, MD;  Location: [place];  Service: Radiology;  Laterality: N/A;  . [place], [place]   Social History:  reports that he has been smoking cigarettes. He has a 13.50 pack-year smoking history. He has never used smokeless tobacco. He reports current alcohol use of about 20.0 standard drinks of alcohol per week. He reports that he does not use drugs.  Family /  Support Systems Marital Status: Single Children: 3 adult children ([place]) Other Supports: Daniel's Girlfriend Anticipated Caregiver: [name] and Stormy Ability/Limitations of Caregiver: none Caregiver Availability: 24/7  Social History Preferred language: English Religion:  Read: Yes Write: Yes   Abuse/Neglect Abuse/Neglect Assessment Can Be Completed: Yes Physical Abuse: Denies Verbal Abuse: Denies Sexual Abuse: Denies Exploitation of patient/patient's resources: Denies Self-Neglect: Denies  Emotional Status Pt's affect, behavior and adjustment status: no Recent Psychosocial Issues: no Psychiatric History: no Substance Abuse History: ETOH abuse person, Smoker  Patient / Family Perceptions, Expectations & Goals Pt/Family understanding of illness & functional limitations: yes Pt/family expectations/goals: Goal to discharge back home with son  [employer]: None Premorbid Home Care/DME Agencies: None Transportation available at discharge: Family able to transport (Daniel/Stormy)  Discharge Planning Living Arrangements: Other (Comment) (Lives with son and daughter in [place]) Support Systems: Children Type of Residence: Private residence (1 Step to enter) Administrator, sports: Medicare Living Expenses: Lives with family Does the patient have any problems obtaining your medications?: No Care Coordinator Anticipated Follow Up Needs: HH/OP Expected length of stay: 5-8 Days  Clinical Impression Sw entered room introduced self, explained role and process. Sw called son to provide him with same information. Pt has no questions or concerns. Son wants to ensure everyone  knows patient is visually impaired, due to incident on previous floor. Son had no other questions or concerns, sw will continue to follow up.   Dyanne Iha 08/06/2020, 2:19 PM

## 2020-08-06 NOTE — Plan of Care (Signed)
  Problem: RH Eating Goal: LTG Patient will perform eating w/assist, cues/equip (OT) Description: LTG: Patient will perform eating with assist, with/without cues using equipment (OT) Flowsheets (Taken 08/06/2020 1618) LTG: Pt will perform eating with assistance level of: Minimal Assistance - Patient > 75%   Problem: RH Grooming Goal: LTG Patient will perform grooming w/assist,cues/equip (OT) Description: LTG: Patient will perform grooming with assist, with/without cues using equipment (OT) Flowsheets (Taken 08/06/2020 1618) LTG: Pt will perform grooming with assistance level of: Minimal Assistance - Patient > 75%   Problem: RH Bathing Goal: LTG Patient will bathe all body parts with assist levels (OT) Description: LTG: Patient will bathe all body parts with assist levels (OT) Flowsheets (Taken 08/06/2020 1618) LTG: Pt will perform bathing with assistance level/cueing: Contact Guard/Touching assist   Problem: RH Dressing Goal: LTG Patient will perform upper body dressing (OT) Description: LTG Patient will perform upper body dressing with assist, with/without cues (OT). Flowsheets (Taken 08/06/2020 1618) LTG: Pt will perform upper body dressing with assistance level of: Minimal Assistance - Patient > 75% Goal: LTG Patient will perform lower body dressing w/assist (OT) Description: LTG: Patient will perform lower body dressing with assist, with/without cues in positioning using equipment (OT) Flowsheets (Taken 08/06/2020 1618) LTG: Pt will perform lower body dressing with assistance level of: Minimal Assistance - Patient > 75%   Problem: RH Toileting Goal: LTG Patient will perform toileting task (3/3 steps) with assistance level (OT) Description: LTG: Patient will perform toileting task (3/3 steps) with assistance level (OT)  Flowsheets (Taken 08/06/2020 1618) LTG: Pt will perform toileting task (3/3 steps) with assistance level: Minimal Assistance - Patient > 75%   Problem: RH Toilet  Transfers Goal: LTG Patient will perform toilet transfers w/assist (OT) Description: LTG: Patient will perform toilet transfers with assist, with/without cues using equipment (OT) Flowsheets (Taken 08/06/2020 1618) LTG: Pt will perform toilet transfers with assistance level of: Supervision/Verbal cueing   Problem: RH Tub/Shower Transfers Goal: LTG Patient will perform tub/shower transfers w/assist (OT) Description: LTG: Patient will perform tub/shower transfers with assist, with/without cues using equipment (OT) Flowsheets (Taken 08/06/2020 1618) LTG: Pt will perform tub/shower stall transfers with assistance level of: Supervision/Verbal cueing

## 2020-08-06 NOTE — Evaluation (Signed)
Physical Therapy Assessment and Plan  Patient Details  Name: Nathan Hart MRN: [id number] Date of Birth: 07-Jan-1956  PT Diagnosis: Abnormality of gait and Hemiparesis dominant Rehab Potential: Good ELOS: 7-10   Today's Date: 08/06/2020 PT Individual Time: 1015-1130 PT Individual Time Calculation (min): 75 min    Hospital Problem: Principal Problem:   Left middle cerebral artery stroke ([place]) Active Problems:   Anemia   Dyslipidemia   Right hemiparesis ([place])   Dementia without behavioral disturbance ([place])   Past Medical History:  Past Medical History:  Diagnosis Date  . Abnormal peripheral vision of left eye   . Blind right eye   . COPD (chronic obstructive pulmonary disease) (HCC)     home o2 hs and prn  . Dyspnea    with activity  . Empyema lung ([place])   . ETOH abuse   . HTN (hypertension)   . Peripheral vascular disease ([place])   . Pneumonia 2020  . Stroke [facility])    Past Surgical History:  Past Surgical History:  Procedure Laterality Date  . BACK SURGERY     disc lumar x 2   . CLAVICLE SURGERY Right   . IR ANGIO INTRA EXTRACRAN SEL COM CAROTID INNOMINATE BILAT MOD SED  09/12/2019  . IR ANGIO INTRA EXTRACRAN SEL COM CAROTID INNOMINATE BILAT MOD SED  08/03/2020  . IR ANGIO VERTEBRAL SEL SUBCLAVIAN INNOMINATE UNI L MOD SED  09/12/2019  . IR ANGIO VERTEBRAL SEL VERTEBRAL UNI R MOD SED  09/12/2019  . IR ANGIO VERTEBRAL SEL VERTEBRAL UNI R MOD SED  08/03/2020  . IR ANGIOGRAM EXTREMITY LEFT  08/03/2020  . IR INTRAVSC STENT CERV CAROTID W/EMB-PROT MOD SED INCL ANGIO  09/24/2019  . IR US GUIDE VASC ACCESS RIGHT  09/12/2019  . IR US GUIDE VASC ACCESS RIGHT  08/03/2020  . KNEE SURGERY Right     x 3 ACL  . RADIOLOGY WITH ANESTHESIA N/A 09/24/2019   Procedure: IR WITH ANESTHESIA/STENTING;  Surgeon: Luanne Bras, MD;  Location: [facility];  Service: Radiology;  Laterality: N/A;  . [place], [place] Clinical Impression:Nathan Hart is a  64 year old right-handed male with history of hypertension, CVA, right carotid stenting September 2020, COPD with home oxygen, legally blind bilaterally, smoker with alcohol use as well as questionable medical compliance.  History taken from chart review and patient.  Patient lives with son and family.  Presented 08/02/2020 with right side weakness numbness and hemiparesis.  Cranial CT scan showed acute left parieto-occipital sequela of remote right MCA territory left basal ganglia insult.  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  Right ICA stent was patent.  Patient did not receive TPA.  MRI acute infarct left posterior greater than anterior circulation is noted presence of a fetal left PCA on the prior CTA no significant mass-effect.  There was a minor petechial hemorrhage.  Admission chemistries glucose 131 hemoglobin 11 alcohol negative urine drug screen negative urinalysis negative nitrite.  Currently maintained on aspirin Plavix for CVA prophylaxis x3 months then aspirin alone.  Subcutaneous Lovenox for DVT prophylaxis   Tolerating a regular diet  Patient transferred to CIR on 08/05/2020 .   Patient currently requires min with mobility secondary to muscle weakness and muscle joint tightness, decreased cardiorespiratoy endurance, impaired timing and sequencing, unbalanced muscle activation and decreased coordination, legal blindness and decreased attention, decreased  awareness, decreased problem solving, decreased safety awareness and decreased memory.  Prior to hospitalization, patient was independent w/all ADLs, transportation and meals provided by son/girlfriend due to blindness,  with mobility and lived with Son in a House home.  Home access is 2-3 per son Nathan Hart (not son he lives with)Stairs to enter.  Patient will benefit from skilled PT intervention to maximize safe functional mobility, minimize fall risk and  decrease caregiver burden for planned discharge home with intermittent assist per pt, need to clarify.  Anticipate patient will benefit from continued PT to address balance/fall risk, will need to discuss w/family to determine accessiblility of OPT at discharge.  PT - End of Session Activity Tolerance: Tolerates 30+ min activity with multiple rests Endurance Deficit: Yes PT Assessment Rehab Potential (ACUTE/IP ONLY): Good PT Barriers to Discharge: Other (comments) PT Barriers to Discharge Comments: cognitive changes PT Patient demonstrates impairments in the following area(s): Balance;Endurance;Motor;Pain;Safety PT Transfers Functional Problem(s): Bed to Chair;Car;Furniture;Floor PT Locomotion Functional Problem(s): Ambulation;Stairs PT Plan PT Intensity: Minimum of 1-2 x/day ,45 to 90 minutes PT Frequency: 5 out of 7 days PT Duration Estimated Length of Stay: 7-10 PT Treatment/Interventions: Ambulation/gait training;Pain management;Stair training;Balance/vestibular training;DME/adaptive equipment instruction;Patient/family education;Therapeutic Activities;Wheelchair propulsion/positioning;Cognitive remediation/compensation;Therapeutic Exercise;Psychosocial support;Community reintegration;Functional mobility training;UE/LE Strength taining/ROM;Discharge planning;Neuromuscular re-education;UE/LE Coordination activities PT Transfers Anticipated Outcome(s): supervision PT Locomotion Anticipated Outcome(s): supervision PT Recommendation Follow Up Recommendations: Home health PT;Outpatient PT (TBD) Patient destination: Home Equipment Recommended: To be determined   PT Evaluation Precautions/Restrictions Precautions Precautions: Fall Precaution Comments: legally blind Restrictions Weight Bearing Restrictions: No General  Pain Pain Assessment Pain Scale: 0-10 Pain Score: 0-No pain Home Living/Prior Functioning Home Living Available Help at Discharge: Family;Available  PRN/intermittently (son works intermittently per pt., poor historian) Type of Home: House Home Access: Stairs to enter [employer] of Steps: 2-3 per son Nathan Hart (not son he lives with) Entrance Stairs-Rails: Right Home Layout: One level Bathroom Shower/Tub: Tub/shower unit;Curtain Biochemist, clinical: Standard Bathroom Accessibility: Yes Additional Comments: pateitn lives with son, Nathan Hart and [place] girlfriend, Nathan Hart  Lives With: Son Prior Function Level of Independence: Independent with basic ADLs;Needs assistance with homemaking (per son Nathan Hart, pt only needs assist w/meals, transportation) Meal Prep: Total  Able to Take Stairs?: Yes Driving: No (legally blind) Vocation: On disability Vision/Perception    legally blind Cognition Overall Cognitive Status: Impaired/Different from baseline Arousal/Alertness: Awake/alert Orientation Level: Oriented to person;Oriented to place;Oriented to situation;Disoriented to time Attention: Sustained Focused Attention: Appears intact Sustained Attention: Impaired Sustained Attention Impairment: Verbal basic;Verbal complex Memory: Impaired Memory Impairment: Storage deficit;Retrieval deficit;Decreased recall of new information;Decreased long term memory Decreased Long Term Memory: Verbal basic Awareness Impairment: Intellectual impairment Problem Solving: Impaired Problem Solving Impairment: Verbal complex;Verbal basic Executive Function: Self Monitoring;Reasoning Reasoning: Impaired Reasoning Impairment: Verbal basic;Verbal complex Self Monitoring: Impaired Self Monitoring Impairment: Verbal complex Safety/Judgment: Impaired Sensation Sensation Light Touch: Appears Intact Proprioception: Appears Intact Coordination Gross Motor Movements are Fluid and Coordinated: No Fine Motor Movements are Fluid and Coordinated: No Finger Nose Finger Test: severely impaired RUE Heel Shin Test: impaired RLE Motor  Motor Motor: Hemiplegia Motor  - Skilled Clinical Observations: RUE impairments significantly more than RLE, distally more than procimally   Trunk/Postural Assessment  Cervical Assessment Cervical Assessment: Within Functional Limits Thoracic Assessment Thoracic Assessment: Exceptions to [facility] (leans mildly r in standing) Lumbar Assessment Lumbar Assessment: Within Functional Limits Postural Control Postural Control: Deficits on evaluation Protective Responses: delayed rue/rle  Balance Balance Balance Assessed: Yes Static Sitting Balance Static Sitting - Balance Support: Feet unsupported Dynamic  Sitting Balance Dynamic Sitting - Balance Support: No upper extremity supported;During functional activity Sitting balance - Comments: mild r tendency w/max reaching to r/across body to r w/LUE Static Standing Balance Static Standing - Balance Support: No upper extremity supported;During functional activity Static Standing - Level of Assistance: 4: Min assist Dynamic Standing Balance Dynamic Standing - Balance Support: No upper extremity supported Dynamic Standing - Level of Assistance: 4: Min assist;3: Mod assist Dynamic Standing - Balance Activities: Lateral lean/weight shifting;Forward lean/weight shifting;Reaching for objects;Reaching across midline Extremity Assessment  RUE Assessment RUE Assessment: Exceptions to [facility] Passive Range of Motion (PROM) Comments: see below, painful at elbow Active Range of Motion (AROM) Comments: see below, painful at elbow General Strength Comments: grossly 3-/5shoulder, 4/5 elbow painful, 3-/5 wrist, poor grasp strength and 0/5 finger extension.  See ot eval for full assessment LUE Assessment LUE Assessment: Within Functional Limits RLE Assessment RLE Assessment: Exceptions to [facility] Passive Range of Motion (PROM) Comments: wfl Active Range of Motion (AROM) Comments: wfl General Strength Comments: grossly 4/5 hip and ankle, 5/5 at knee LLE Assessment LLE Assessment: Within Functional  Limits  Care Tool Care Tool Bed Mobility Roll left and right activity   Roll left and right assist level: Supervision/Verbal cueing    Sit to lying activity   Sit to lying assist level: Supervision/Verbal cueing    Lying to sitting edge of bed activity   Lying to sitting edge of bed assist level: Supervision/Verbal cueing     Care Tool Transfers Sit to stand transfer   Sit to stand assist level: Contact Guard/Touching assist    Chair/bed transfer   Chair/bed transfer assist level: Contact Guard/Touching assist     Physiological scientist transfer assist level: Contact Guard/Touching assist      Care Tool Locomotion Ambulation   Assist level: Minimal Assistance - Patient > 75%   Max distance: 75  Walk 10 feet activity   Assist level: Minimal Assistance - Patient > 75%     Walk 50 feet with 2 turns activity   Assist level: Minimal Assistance - Patient > 75%    Walk 150 feet activity        Walk 10 feet on uneven surfaces activity   Assist level: Minimal Assistance - Patient > 75%    Stairs   Assist level: Minimal Assistance - Patient > 75% Stairs assistive device: 1 hand rail    Walk up/down 1 step activity   Walk up/down 1 step (curb) assist level: Minimal Assistance - Patient > 75% Walk up/down 1 step or curb assistive device: 1 hand rail    Walk up/down 4 steps activity Walk up/down 4 steps assist level: Minimal Assistance - Patient > 75% Walk up/down 4 steps assistive device: 1 hand rail  Walk up/down 12 steps activity        Pick up small objects from floor   Pick up small object from the floor assist level: Minimal Assistance - Patient > 75%    Wheelchair            Wheel 50 feet with 2 turns activity      Wheel 150 feet activity        Refer to Care Plan for Long Term Goals  SHORT TERM GOAL WEEK 1 PT Short Term Goal 1 (Week 1): STGs=LTGs  Recommendations for other services: None   Skilled Therapeutic  Intervention Evaluation completed (see details above and below) with  education on PT POC and goals and individual treatment initiated with focus on functional mobility/transfers, LE strength, dynamic standing balance/coordination, ambulation, stair navigation, simulated car transfers, and improved endurance with activity   Pt initially supine w/son Nathan Hart at bedside.  Educated pt/son re: purpose of evaluation, discussed dc planning/team conference/daily schedule, oriented to unit, requested family bring in clothes/shoes.    Therapist obtained proper wc for pt and pt supine to side to sit w/supervision.  Sat at edge of bed, scoots in sitting all w/supervision.  Able to perform dynamic reaching w/only cga w/increased distances to R, delayed RUE reactions. STS w/cga then min assist w/static standing due to mild r lean.   SPT to wc w/verbal cues for navigation due to blindness, cga for balance to R side. Pt then transported to gym for continued evaluation and treatment w/focus on balance/gait/endurance.  Gait multiple trials without AD distances including 72f, 656f [id number] assist due to mild r lean and tendency towards anterior lob w/increased pace/decreased control, mildly decreased base of support, occasional mild decreased clearance RLE, minimal armswing RUE.  Ramps ascends/descends using single rail and min assist  Repeated STS from wc wihtout use of hands and cga for safety 5x = 16 sec Second trial 5x=16sec  Balance: Static stand - mild r lean, mild increased sway Static stand w/ Feet together- heavy R lean, mod assist to maintain balance Turning 90* L/R min assist, mild increase in bos Standing turning head and nodding head, min assist Reaching to pick up object from floor - min assist Marching in place - very limited clearance by pt due to percieved balance instabliity w/task, min assist Backing 5 steps min assist.   Pt requires several min break between activites due to  fatigue, sob.  02 sats WNL throughout session.   At end of session, pt performed gait hallway to bed x [id number] assist, deviations as above, turn/sit to bed w/min assist.  Sit to supine w/supervision.  Scoots in bed w/supervision.   Pt left supine w/rails up x 4, alarm set, bed in lowest position, and needs in reach.     Mobility Bed Mobility Bed Mobility: Rolling Right;Rolling Left;Right Sidelying to Sit Rolling Right: Supervision/verbal cueing Rolling Left: Supervision/Verbal cueing Right Sidelying to Sit: Supervision/Verbal cueing Transfers Transfers: Sit to Stand Sit to Stand: Contact Guard/Touching assist Transfer (Assistive device): None Locomotion  Gait Gait: Yes Gait Pattern: Impaired Gait velocity: decreased Stairs / Additional Locomotion Stairs: Yes Stairs Assistance: Minimal Assistance - Patient > 75% Stair Management Technique: One rail Left Number of Stairs: 4 Ramp: Minimal Assistance - Patient >75% Curb: Minimal Assistance - Patient >75% Wheelchair Mobility Wheelchair Mobility: No   Discharge Criteria: Patient will be discharged from PT if patient refuses treatment 3 consecutive times without medical reason, if treatment goals not met, if there is a change in medical status, if patient makes no progress towards goals or if patient is discharged from hospital.  The above assessment, treatment plan, treatment alternatives and goals were discussed and mutually agreed upon: by patient  BarbaJerrilyn Cairo/2021, 11:19 AM

## 2020-08-07 MED ORDER — CARVEDILOL 6.25 MG PO TABS
6.2500 mg | ORAL_TABLET | Freq: Two times a day (BID) | ORAL | Status: DC
  Administered 2020-08-07 – 2020-08-14 (×14): 6.25 mg via ORAL
  Filled 2020-08-07 (×14): qty 1

## 2020-08-07 NOTE — Progress Notes (Signed)
[facility] PHYSICAL MEDICINE & REHABILITATION PROGRESS NOTE  Subjective/Complaints: No complaints this morning. BP a little bit high BM yesterday Says therapy is going well  ROS: Denies CP, SOB, N/V/D  Objective: Vital Signs: Blood pressure (!) 147/94, pulse 78, temperature 98.3 F (36.8 C), resp. rate 14, SpO2 99 %. No results found. Recent Labs    08/06/20 0640  WBC 7.0  HGB 9.9*  HCT 35.6*  PLT 214   Recent Labs    08/05/20 0141 08/06/20 0640  NA 136 137  K 3.6 3.7  CL 103 104  CO2 23 23  GLUCOSE 103* 107*  BUN 9 10  CREATININE 0.72 0.60*  CALCIUM 8.3* 8.5*    Physical Exam: BP (!) 147/94 (BP Location: Left Arm)   Pulse 78   Temp 98.3 F (36.8 C)   Resp 14   SpO2 99%  General: Alert and oriented x 3, No apparent distress HEENT: Head is normocephalic, atraumatic, Difficulty tracking. oral mucosa pink and moist, dentition intact, ext ear canals clear,  Neck: Supple without JVD or lymphadenopathy Heart: Reg rate and rhythm. No murmurs rubs or gallops Chest: CTA bilaterally without wheezes, rales, or rhonchi; no distress Abdomen: Soft, non-tender, non-distended, bowel sounds positive. Extremities: No clubbing, cyanosis, or edema. Pulses are 2+ Psych: Normal mood.  Normal behavior. Musc: No edema in extremities.  No tenderness in extremities. Neuro: Alert Motor: LUE: 5/5 proximal distal RUE: 4/5 proximal distal LLE: 5/5 proximal distal RLE: 4+/5 proximal and distal   Assessment/Plan: 1. Functional deficits secondary to left posterior MCA/PCA infarcts which require 3+ hours per day of interdisciplinary therapy in a comprehensive inpatient rehab setting.  Physiatrist is providing close team supervision and 24 hour management of active medical problems listed below.  Physiatrist and rehab team continue to assess barriers to discharge/monitor patient progress toward functional and medical goals  Care Tool:  Bathing    Body parts bathed by patient:  Right arm, Chest, Abdomen, Front perineal area, Buttocks, Right upper leg, Left upper leg, Right lower leg, Left lower leg, Face   Body parts bathed by helper: Left arm     Bathing assist Assist Level: Minimal Assistance - Patient > 75%     Upper Body Dressing/Undressing Upper body dressing Upper body dressing/undressing activity did not occur (including orthotics): N/A What is the patient wearing?: Pull over shirt    Upper body assist Assist Level: Moderate Assistance - Patient 50 - 74%    Lower Body Dressing/Undressing Lower body dressing    Lower body dressing activity did not occur: N/A What is the patient wearing?: Pants     Lower body assist Assist for lower body dressing: Moderate Assistance - Patient 50 - 74%     Toileting Toileting Toileting Activity did not occur Landscape architect and hygiene only): N/A (no void or bm)  Toileting assist Assist for toileting: Minimal Assistance - Patient > 75%     Transfers Chair/bed transfer  Transfers assist     Chair/bed transfer assist level: Contact Guard/Touching assist     Locomotion Ambulation   Ambulation assist      Assist level: Minimal Assistance - Patient > 75% Assistive device: No Device Max distance: 75   Walk 10 feet activity   Assist     Assist level: Minimal Assistance - Patient > 75% Assistive device: No Device   Walk 50 feet activity   Assist    Assist level: Minimal Assistance - Patient > 75% Assistive device: No Device  Walk 150 feet activity   Assist Walk 150 feet activity did not occur: Safety/medical concerns (fatigue)         Walk 10 feet on uneven surface  activity   Assist     Assist level: Minimal Assistance - Patient > 75% Assistive device: Hand held assist   Wheelchair     Assist Will patient use wheelchair at discharge?: No             Wheelchair 50 feet with 2 turns activity    Assist            Wheelchair 150 feet activity      Assist            Medical Problem List and Plan: 1.  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  Continue CIR 2.  Antithrombotics: -DVT/anticoagulation: Lovenox             -antiplatelet therapy: Aspirin 325 mg daily and Plavix 75 mg daily x3 months then aspirin alone 3. Pain Management: Tylenol as needed. Well controlled.  4. Mood: Provide emotional support             -antipsychotic agents: N/A 5. Neuropsych/dementia: This patient is  not capable of making decisions on his own behalf. 6. Skin/Wound Care: Routine skin checks 7. Fluids/Electrolytes/Nutrition: Routine in and outs. 8.  Hypertension.  Coreg 3.125 mg twice daily.    8/14: elevated. Increase coreg to 6.25mg  BID 9.  COPD.  Oxygen dependent.  Check oxygen saturations every shift 10.  Legally blind bilaterally.  Patient has assistance of son at home- due to macular degeneration 11.  History of tobacco and alcohol use.  Provide counseling 12.  Hyperlipidemia.    Continue Lipitor 13.  Questionable medical compliance.  Provide counseling with patient and family. 14.  Anemia  Hemoglobin 9.9 on 8/13, labs ordered for Monday 15. Abdominal pain resolved after BM  LOS: 2 days A FACE TO FACE EVALUATION WAS PERFORMED  Nathan Hart Nathan Hart 08/07/2020, 1:10 PM

## 2020-08-08 ENCOUNTER — Inpatient Hospital Stay (HOSPITAL_COMMUNITY): Payer: Medicare Other

## 2020-08-08 ENCOUNTER — Inpatient Hospital Stay (HOSPITAL_COMMUNITY): Payer: Medicare Other | Admitting: Speech Pathology

## 2020-08-08 NOTE — Progress Notes (Signed)
Occupational Therapy Session Note  Patient Details  Name: BAKER MORONTA MRN: [id number] Date of Birth: 05/12/1956  Today's Date: 08/08/2020 OT Individual Time: 1400-1425 OT Individual Time Calculation (min): 25 min    Short Term Goals: Week 1:  OT Short Term Goal 1 (Week 1): STGs=LTGs due to ELOS  Skilled Therapeutic Interventions/Progress Updates:    Pt sitting EOB upon arrival. OT intervention with focus on RUE finger extension during functional task of grasping and releasing objects.  Pt with increased extension noted in digit 2 and 5 with increased thumb abduction. Hand fatigues quickly and rest breaks required to continue with activities. Pt remained seated EOB with all needs within reach and bed alarm activated. Pt encouraged by increased finger extension.   Therapy Documentation Precautions:  Precautions Precautions: Fall Precaution Comments: legally blind Restrictions Weight Bearing Restrictions: No   Pain:  Pt stated his back was feeling better   Therapy/Group: Individual Therapy  Leroy Libman 08/08/2020, 2:42 PM

## 2020-08-08 NOTE — Progress Notes (Signed)
Speech Language Pathology Daily Session Note  Patient Details  Name: Nathan Hart MRN: [id number] Date of Birth: 19-Aug-1956  Today's Date: 08/08/2020 SLP Individual Time: 0731-0826 SLP Individual Time Calculation (min): 55 min  Short Term Goals: Week 1: SLP Short Term Goal 1 (Week 1): Patient will demonstrate problem solving for functional tasks (locating utensils, cups on tray, etc) with mod A. SLP Short Term Goal 2 (Week 1): Patient will be able to answer basic level hypothetical problem solving and reasoning questions with mod A to elaborate. SLP Short Term Goal 3 (Week 1): Patient will be able to describe activities he has completed in therapies (PT, OT, ST) with mod A cues to recall. SLP Short Term Goal 4 (Week 1): Patient will demonstrate adequate orientation to place, situation,approximate time of day with mod A. SLP Short Term Goal 5 (Week 1): Patient will demonstrate adequate safety awareness when performing functional problem solving tasks with mod A.  Skilled Therapeutic Interventions: Pt was seen for skilled ST targeting cognitive-linguistic goals. Pt easily aroused to soft voice on SLP's arrival. Per last ST note, pt appeared more engaged and with an increase in verbal output and responses to ST's questions in comparison to day of evaluation. Moderate multimodal cues provided for problem solving tray set up of breakfast (due to visual impairments). Pt required multiple choice cues to orient to year, however independently oriented to month and "hospital". Total A required for orientation to situation. When questioned regarding knowledge of purpose of CIR/distinguishing between therapies, pt stated he had "no idea" - therefore, review of each discipline and potential targets discussed with pt. Pt with good awareness of impairments in memory, thought organization, and word finding since acute CVA (no cueing required). Max A multimodal cueing required for functional problem solving use  of call bell to request assistance, however pt able to return demonstration after teaching. He verbally identified 2 situations in which he would need to call help help with only Min A question cues. Due to visual impairments, textured tape affixed to pt's call bell to assist with ability to find help button. Pt left laying in bed with alarm set and needs within reach. Continue per current plan of care.        Pain Pain Assessment Pain Scale: 0-10 Pain Score: 0-No pain  Therapy/Group: Individual Therapy  Arbutus Leas 08/08/2020, 9:24 AM

## 2020-08-08 NOTE — Progress Notes (Signed)
[facility] PHYSICAL MEDICINE & REHABILITATION PROGRESS NOTE  Subjective/Complaints: Wanted to leave AMA yesterday but son counseled him into staying. He is content today. Denies complaints.   ROS: Denies CP, SOB, N/V/D  Objective: Vital Signs: Blood pressure 135/75, pulse 78, temperature 97.9 F (36.6 C), temperature source Oral, resp. rate 18, SpO2 98 %. No results found. Recent Labs    08/06/20 0640  WBC 7.0  HGB 9.9*  HCT 35.6*  PLT 214   Recent Labs    08/06/20 0640  NA 137  K 3.7  CL 104  CO2 23  GLUCOSE 107*  BUN 10  CREATININE 0.60*  CALCIUM 8.5*    Physical Exam: BP 135/75 (BP Location: Right Arm)   Pulse 78   Temp 97.9 F (36.6 C) (Oral)   Resp 18   SpO2 98%  General: Alert and oriented x 3, No apparent distress HEENT: Head is normocephalic, atraumatic, PERRLA, EOMI, sclera anicteric, oral mucosa pink and moist, dentition intact, ext ear canals clear,  Neck: Supple without JVD or lymphadenopathy Heart: Reg rate and rhythm. No murmurs rubs or gallops Chest: CTA bilaterally without wheezes, rales, or rhonchi; no distress Abdomen: Soft, non-tender, non-distended, bowel sounds positive. Extremities: No clubbing, cyanosis, or edema. Pulses are 2+ Skin: Clean and intact without signs of breakdown Psych: Normal mood.  Normal behavior. Musc: No edema in extremities.  No tenderness in extremities. Neuro: Alert Motor: LUE: 5/5 proximal distal RUE: 4/5 proximal distal, grasp 3/5, trace finger extension.  LLE: 5/5 proximal distal RLE: 4+/5 proximal and distal  Assessment/Plan: 1. Functional deficits secondary to left posterior MCA/PCA infarcts which require 3+ hours per day of interdisciplinary therapy in a comprehensive inpatient rehab setting.  Physiatrist is providing close team supervision and 24 hour management of active medical problems listed below.  Physiatrist and rehab team continue to assess barriers to discharge/monitor patient progress toward  functional and medical goals  Care Tool:  Bathing    Body parts bathed by patient: Right arm, Chest, Abdomen, Front perineal area, Buttocks, Right upper leg, Left upper leg, Right lower leg, Left lower leg, Face   Body parts bathed by helper: Left arm     Bathing assist Assist Level: Minimal Assistance - Patient > 75%     Upper Body Dressing/Undressing Upper body dressing Upper body dressing/undressing activity did not occur (including orthotics): N/A What is the patient wearing?: Pull over shirt    Upper body assist Assist Level: Moderate Assistance - Patient 50 - 74%    Lower Body Dressing/Undressing Lower body dressing    Lower body dressing activity did not occur: N/A What is the patient wearing?: Pants     Lower body assist Assist for lower body dressing: Moderate Assistance - Patient 50 - 74%     Toileting Toileting Toileting Activity did not occur Landscape architect and hygiene only): N/A (no void or bm)  Toileting assist Assist for toileting: Minimal Assistance - Patient > 75%     Transfers Chair/bed transfer  Transfers assist     Chair/bed transfer assist level: Contact Guard/Touching assist     Locomotion Ambulation   Ambulation assist      Assist level: Minimal Assistance - Patient > 75% Assistive device: No Device Max distance: 75   Walk 10 feet activity   Assist     Assist level: Minimal Assistance - Patient > 75% Assistive device: No Device   Walk 50 feet activity   Assist    Assist level: Minimal Assistance - Patient >  75% Assistive device: No Device    Walk 150 feet activity   Assist Walk 150 feet activity did not occur: Safety/medical concerns (fatigue)         Walk 10 feet on uneven surface  activity   Assist     Assist level: Minimal Assistance - Patient > 75% Assistive device: Hand held assist   Wheelchair     Assist Will patient use wheelchair at discharge?: No             Wheelchair 50  feet with 2 turns activity    Assist            Wheelchair 150 feet activity     Assist            Medical Problem List and Plan: 1.  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  Continue CIR 2.  Antithrombotics: -DVT/anticoagulation: Lovenox             -antiplatelet therapy: Aspirin 325 mg daily and Plavix 75 mg daily x3 months then aspirin alone 3. Pain Management: Tylenol as needed. Has some right forearm pain- refused warm compress.  4. Mood: Provide emotional support             -antipsychotic agents: N/A 5. Neuropsych/dementia: This patient is  not capable of making decisions on his own behalf. 6. Skin/Wound Care: Routine skin checks 7. Fluids/Electrolytes/Nutrition: Routine in and outs. 8.  Hypertension.  Coreg 3.125 mg twice daily.    8/14: elevated. Increase coreg to 6.25mg  BID  8/15: well controlled.  9.  COPD.  Oxygen dependent.  Check oxygen saturations every shift 10.  Legally blind bilaterally.  Patient has assistance of son at home- due to macular degeneration 11.  History of tobacco and alcohol use.  Provide counseling 12.  Hyperlipidemia.    Continue Lipitor 13.  Questionable medical compliance.  Provide counseling with patient and family. 14.  Anemia  Hemoglobin 9.9 on 8/13, labs ordered for Monday 15. Abdominal pain resolved after BM 16. Weak RUE finger extensors- seemed to benefit from NMES with Tom   LOS: 3 days A FACE TO FACE EVALUATION WAS PERFORMED  Martha Clan P Sergei Delo 08/08/2020, 2:12 PM

## 2020-08-08 NOTE — Progress Notes (Signed)
Physical Therapy Session Note  Patient Details  Name: Nathan Hart MRN: [id number] Date of Birth: 1956-03-03  Today's Date: 08/08/2020 PT Individual Time: 0902-1005 PT Individual Time Calculation (min): 63 min   Short Term Goals: Week 1:  PT Short Term Goal 1 (Week 1): STGs=LTGs  Skilled Therapeutic Interventions/Progress Updates:     Patient in bed on RA upon PT arrival. Patient alert and agreeable to PT session. Patient reported 5/10 low back pain during session, RN made aware. PT provided repositioning, rest breaks, and distraction as pain interventions throughout session.   Patient remained on RA throughout session. SPO2 >90, except after ambulation, desaturated to 89%, recovered to >92% <1 min with pursed lip breathing. Pulse-ox with delayed signal due to poor perfusion in fingers. HR 80-96 bpm throughout. BP sitting: 105/68, standing 105/63, after ambulation 126/73.  Therapeutic Activity: Bed Mobility: Patient performed supine to/from sit with supervision in a flat bed without use of bed rails. Provided verbal cues for performing log rolling due to increased low back pain with mobility. Transfers: Patient performed sit to/from stand x5 with min A progressing to CGA with notable catch in low back that improved with mobility. Provided verbal cues for scooting forward and forward weight shift to stand, and reaching back for controlled descent for safety.  Gait Training:  Patient ambulated 65 feet x2 without an AD with CGA for safety/balance. Ambulated with decreased gait speed, decreased step length and height R>L, increased B hip and knee flexion in stance, forward trunk lean, and downward head gaze. Provided verbal cues for paced breathing and erect posture for improved breath support with gait, and increased step height to reduce fall risk. Patient followed simple directions for navigating the environment while ambulating due to visual deficts. Reports seeing changes in light and  some shapes in space.   Neuromuscular Re-ed: Patient performed the Berg Balance Test: Patient demonstrates increased fall risk as noted by score of 24/56 on Berg Balance Scale. (<36= high risk for falls, close to 100%; 37-45 significant >80%; 46-51 moderate >50%; 52-55 lower >25%) Educated patient on results and interpretation following standardized test. Patient stated understanding of high fall risk due to balance deficits, however will need further reinforcement due to decreased recall and STM.  Patient required increased time and rest breaks due to fatigue with minimal mobility. Provided patient education during rest breaks on increased activity at home, patient reports he has been mostly sedentary for several months since decline in his vision. Also, discussed strategies for improved breath support and relaxation using diaphragmatic breathing.  Patient in bed on RA, SPO2 94%, at end of session with breaks locked, bed alarm set, and all needs within reach.    Therapy Documentation Precautions:  Precautions Precautions: Fall Precaution Comments: legally blind Restrictions Weight Bearing Restrictions: No Balance: Standardized Balance Assessment Standardized Balance Assessment: Berg Balance Test Berg Balance Test Sit to Stand: Needs minimal aid to stand or to stabilize Standing Unsupported: Able to stand 2 minutes with supervision Sitting with Back Unsupported but Feet Supported on Floor or Stool: Able to sit safely and securely 2 minutes Stand to Sit: Controls descent by using hands Transfers: Needs one person to assist Standing Unsupported with Eyes Closed: Able to stand 10 seconds with supervision Standing Ubsupported with Feet Together: Needs help to attain position and unable to hold for 15 seconds From Standing, Reach Forward with Outstretched Arm: Reaches forward but needs supervision From Standing Position, Pick up Object from Floor: Able to pick up shoe, needs  supervision From Standing Position, Turn to Look Behind Over each Shoulder: Needs supervision when turning Turn 360 Degrees: Needs assistance while turning Standing Unsupported, Alternately Place Feet on Step/Stool: Able to complete >2 steps/needs minimal assist Standing Unsupported, One Foot in Front: Able to take small step independently and hold 30 seconds Standing on One Leg: Tries to lift leg/unable to hold 3 seconds but remains standing independently Total Score: 24/56    Therapy/Group: Individual Therapy  Selby Foisy L Wanya Bangura PT, DPT  08/08/2020, 5:51 PM

## 2020-08-08 NOTE — Progress Notes (Signed)
Occupational Therapy Session Note  Patient Details  Name: Nathan Hart MRN: [id number] Date of Birth: 1956/01/20  Today's Date: 08/08/2020 OT Individual Time: 1100-1155 OT Individual Time Calculation (min): 55 min    Short Term Goals: Week 1:  OT Short Term Goal 1 (Week 1): STGs=LTGs due to ELOS  Skilled Therapeutic Interventions/Progress Updates:    Pt resting in bed upon arrival.  Pt declined bathing/changing clothing this morning.  Pt stated he hadn't been taking a shower at home because he didn't have anything to sit on. Pt sat EOB with supervision.  Functional amb in room with HHA at min A level for safety.  Pt requires max verbal cues for navigation secondary to visual deficits. Pt returned to bed and sat EOB at supervision level. Pt with weak RUE grasp with trace finger extension.  Pt WFL for wrist flexion/extension. Focus on RUE/hand NMR with NMES to facilitate finger extension during functional tasks (releasing cup after grasp). See below. Pt returned to supine in bed at end of session.  All needs within reach and bed alarm activated.  1:1 NMES applied to RUE finger extensors to facilitate finger extension and thumb abduction Ratio 1:3 Rate 35 pps Waveform- Asymmetric Ramp 1.0 Pulse 300 Intensity- 23 Duration -   10 mins  Pt with voluntary extension of 4th and 5th digit and thumb abduction after treatment  No adverse reactions after treatment and skin intact  Therapy Documentation Precautions:  Precautions Precautions: Fall Precaution Comments: legally blind Restrictions Weight Bearing Restrictions: No   Pain: Pt stated his back hurt (unrated) and meds requested; RN notified  Therapy/Group: Individual Therapy  Leroy Libman 08/08/2020, 12:06 PM

## 2020-08-09 ENCOUNTER — Inpatient Hospital Stay (HOSPITAL_COMMUNITY): Payer: Medicare Other | Admitting: Speech Pathology

## 2020-08-09 ENCOUNTER — Inpatient Hospital Stay (HOSPITAL_COMMUNITY): Payer: Medicare Other | Admitting: Occupational Therapy

## 2020-08-09 ENCOUNTER — Inpatient Hospital Stay (HOSPITAL_COMMUNITY): Payer: Medicare Other

## 2020-08-09 DIAGNOSIS — G8191 Hemiplegia, unspecified affecting right dominant side: Secondary | ICD-10-CM

## 2020-08-09 MED ORDER — BLOOD PRESSURE CONTROL BOOK
Freq: Once | Status: AC
  Filled 2020-08-09: qty 1

## 2020-08-09 NOTE — Progress Notes (Signed)
Occupational Therapy Session Note  Patient Details  Name: Nathan Hart MRN: [id number] Date of Birth: Jul 18, 1956  Today's Date: 08/09/2020 OT Individual Time: 3419-3790 OT Individual Time Calculation (min): 105 min    Short Term Goals: Week 1:  OT Short Term Goal 1 (Week 1): STGs=LTGs due to ELOS  Skilled Therapeutic Interventions/Progress Updates:    Pt received in bed and agreeable to therapy. Discussed that he does not shower at home as he does not have anything to sit on.  Talked about use of a bench and that we can practice that in a tub tomorrow.  Today he was agreeable to a shower.  Sat to EOB with S and sat for a few minutes to ensure he was not lightheaded, ambulated to bathroom with min A and cues HHA. Spent extra time on pausing between transitions of movement to avoid fatigue, lightheadedness.  He does breathe heavily at times but denied shortness of breath.  Attempted O2 readings but sensor unable to get a reading.  Had pt take several short rest breaks through out session to avoid over taking pt.    Undressed sitting on edge of bench with S.  Showered with min A to reach L arm as R hand not able to manipulate wash cloth.  Ambulated back to bed min A to don thigh high teds (total A), pants CGA, shoes S and tying shoes total A.    Pt eager to shave as he c/o of skin feeling itchy. His family assist him at home due to visual impairments.  Pt sat at sink, applied shaving cream himself and therapist shaved pt.  Pt brushed teeth with set up to apply paste to brush.    With set up, to open bottom of shirt pt able to guide his arms through sleeves and finish pulling over his head with min a.   Worked on sit to stands with CGA and static standing balance with close S to CGA.   R shoulder AROM WFL along with elbow flexion. Able to squeeze objects and the grab bar with R hand.   Sitting in arm chair with R forearm on table, used estim to facilitate finger extension. He has about 10%  of ext range. Used empi with small muscle atrophy setting with 5 on and 5 sec off at 35 pps at intensity 30 for 15 min.   Pt worked on actively squeezing washcloth on off phase and used estim for a/arom to achieve full finger extension.    Pt transferred back to bed with min A to orient body to bed (turning around) and S to move to rest in supine.  Pt resting in bed with alarm set and all needs met.   Therapy Documentation Precautions:  Precautions Precautions: Fall Precaution Comments: legally blind Restrictions Weight Bearing Restrictions: No    Vital Signs:  133/77  Pain: Pain Assessment Pain Scale: 0-10 Pain Score: 0-No pain ADL: ADL Eating: Not assessed Grooming: Setup, Supervision/safety Where Assessed-Grooming: Sitting at sink Upper Body Bathing: Minimal assistance Where Assessed-Upper Body Bathing: Shower Lower Body Bathing: Contact guard Where Assessed-Lower Body Bathing: Shower Upper Body Dressing: Minimal assistance, Minimal cueing Where Assessed-Upper Body Dressing: Chair Lower Body Dressing: Moderate assistance (CGA pants, mod shoes) Where Assessed-Lower Body Dressing: Chair Toileting: Not assessed Toilet Transfer: Therapist, music Method: Stand pivot Science writer: Radiographer, therapeutic: Not assessed Social research officer, government: Minimal assistance, Minimal cueing Social research officer, government Method: Heritage manager: Radio broadcast assistant, Museum/gallery curator  bars   Therapy/Group: Individual Therapy  [place] 08/09/2020, 10:16 AM

## 2020-08-09 NOTE — Progress Notes (Signed)
Patient ID: Nathan Hart, male   DOB: 07/28/1956, 64 y.o.   MRN: [id number] Met with patient and daughter to review nurse CM role and collaboration with SW to facilitate discharge preparations. Reviewed stroke risks including smoking, ETOH (6 pack/day) hx, HTN, HLD. Discussed dietary recommendations and options for beer intake/day. Reviewed changes in moderation and no all at once or totally go cold turkey would not be successful. Reviewed cooking options with the daughter who reports patient is a "meat and potatoes kind of guy"; reviewed suggested portion control and adding vegetables with the meat and potatoes. Eating a baked potato instead of a "loaded potato", etc. Gave patient handouts for information reviewed to share with his son. Reports has no urge to smoke and noted CPOD hx. Patient's daughter noted he has the nebulizer medications at the home but will need a nebulizer machine for discharge. Used [facility] in [place] for oxygen/medications. SW made aware of the need for the order for discharge. 

## 2020-08-09 NOTE — Progress Notes (Signed)
Speech Language Pathology Daily Session Note  Patient Details  Name: Nathan Hart MRN: [id number] Date of Birth: 1956-07-22  Today's Date: 08/09/2020 SLP Individual Time: 0768-0881 SLP Individual Time Calculation (min): 55 min  Short Term Goals: Week 1: SLP Short Term Goal 1 (Week 1): Patient will demonstrate problem solving for functional tasks (locating utensils, cups on tray, etc) with mod A. SLP Short Term Goal 2 (Week 1): Patient will be able to answer basic level hypothetical problem solving and reasoning questions with mod A to elaborate. SLP Short Term Goal 3 (Week 1): Patient will be able to describe activities he has completed in therapies (PT, OT, ST) with mod A cues to recall. SLP Short Term Goal 4 (Week 1): Patient will demonstrate adequate orientation to place, situation,approximate time of day with mod A. SLP Short Term Goal 5 (Week 1): Patient will demonstrate adequate safety awareness when performing functional problem solving tasks with mod A.  Skilled Therapeutic Interventions: Pt was seen for skilled ST targeting cognitive goals and education with pt's daughter, who arrived ~1/2 way through session. Pt oriented to place (hospital) and situation, Min A required for orientation to month. Multiple choice cues were required for orientation to year consistently throughout session (asked X3). Pt unable to demonstrate recall or carryover of how to problem solve locating button to request help on call bell. Therefore, more drastic change in texture applied to call bell. Pt did return demonstration of how to request help with its use with a 5-minute delay, but question ability to use it functionally without anyone present to assist him with cues. Pt required overall Max A for use of verbal repetition strategy to recall details from short paragraphs and functional appointment memos. When pt's daughter arrived, SLP reviewed pt's current goals in [name] and potential impact of short term  memory and problem solving deficits on daily functioning (in combination with visual impairment). SLP reinforced recommendation for 24/7 supervision at home, full assist with medication and finance management, cooking, and other complex ADLs. Pt's daughter verbally acknowledged all education and recommendations, questions answered to their satisfaction. Pt left sitting in bed with alarm set and needs within reach, daughter still present. Continue per current plan of care.       Pain Pain Assessment Pain Scale: 0-10 Pain Score: 0-No pain  Therapy/Group: Individual Therapy  Arbutus Leas 08/09/2020, 3:01 PM

## 2020-08-09 NOTE — Progress Notes (Signed)
[place] PHYSICAL MEDICINE & REHABILITATION PROGRESS NOTE  Subjective/Complaints: Pt seems to be in good spirits. No ideas about leaving today.   ROS: Patient denies fever, rash, sore throat, blurred vision, nausea, vomiting, diarrhea, cough, shortness of breath or chest pain, joint or back pain, headache, or mood change.   Objective: Vital Signs: Blood pressure (!) 145/80, pulse 78, temperature (!) 97.5 F (36.4 C), temperature source Oral, resp. rate 16, SpO2 97 %. No results found. No results for input(s): WBC, HGB, HCT, PLT in the last 72 hours. No results for input(s): NA, K, CL, CO2, GLUCOSE, BUN, CREATININE, CALCIUM in the last 72 hours.  Physical Exam: BP (!) 145/80   Pulse 78   Temp (!) 97.5 F (36.4 C) (Oral)   Resp 16   SpO2 97%  Constitutional: No distress . Vital signs reviewed. HEENT: EOMI, oral membranes moist Neck: supple Cardiovascular: RRR without murmur. No JVD    Respiratory/Chest: CTA Bilaterally without wheezes or rales. Normal effort    GI/Abdomen: BS +, non-tender, non-distended Ext: no clubbing, cyanosis, or edema Psych: reasonably pleasant and cooperative Skin: Clean and intact without signs of breakdown Psych: Normal mood.  Normal behavior.   Musc: No edema in extremities.  No tenderness in extremities. Neuro: Alert. Processing delays, ?apraxia vs word finding deficits Motor: LUE: 5/5 proximal distal RUE: 4/5 proximal distal, grasp 3/5, trace finger extension.  LLE: 5/5 proximal distal RLE: 4+/5 proximal and distal  Assessment/Plan: 1. Functional deficits secondary to left posterior MCA/PCA infarcts which require 3+ hours per day of interdisciplinary therapy in a comprehensive inpatient rehab setting.  Physiatrist is providing close team supervision and 24 hour management of active medical problems listed below.  Physiatrist and rehab team continue to assess barriers to discharge/monitor patient progress toward functional and medical  goals  Care Tool:  Bathing    Body parts bathed by patient: Right arm, Chest, Abdomen, Front perineal area, Buttocks, Right upper leg, Left upper leg, Right lower leg, Left lower leg, Face   Body parts bathed by helper: Left arm     Bathing assist Assist Level: Minimal Assistance - Patient > 75%     Upper Body Dressing/Undressing Upper body dressing Upper body dressing/undressing activity did not occur (including orthotics): N/A What is the patient wearing?: Pull over shirt    Upper body assist Assist Level: Minimal Assistance - Patient > 75%    Lower Body Dressing/Undressing Lower body dressing    Lower body dressing activity did not occur: N/A What is the patient wearing?: Pants     Lower body assist Assist for lower body dressing: Contact Guard/Touching assist     Toileting Toileting Toileting Activity did not occur (Clothing management and hygiene only): N/A (no void or bm)  Toileting assist Assist for toileting: Minimal Assistance - Patient > 75%     Transfers Chair/bed transfer  Transfers assist     Chair/bed transfer assist level: Minimal Assistance - Patient > 75%     Locomotion Ambulation   Ambulation assist      Assist level: Contact Guard/Touching assist Assistive device: No Device Max distance: 65 ft   Walk 10 feet activity   Assist     Assist level: Contact Guard/Touching assist Assistive device: No Device   Walk 50 feet activity   Assist    Assist level: Contact Guard/Touching assist Assistive device: No Device    Walk 150 feet activity   Assist Walk 150 feet activity did not occur: Safety/medical concerns (fatigue)  Walk 10 feet on uneven surface  activity   Assist     Assist level: Minimal Assistance - Patient > 75% Assistive device: Hand held assist   Wheelchair     Assist Will patient use wheelchair at discharge?: No             Wheelchair 50 feet with 2 turns activity    Assist             Wheelchair 150 feet activity     Assist           BP (!) 145/80   Pulse 78   Temp (!) 97.5 F (36.4 C) (Oral)   Resp 16   SpO2 97%   Medical Problem List and Plan: 1.  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  Continue CIR 2.  Antithrombotics: -DVT/anticoagulation: Lovenox             -antiplatelet therapy: Aspirin 325 mg daily and Plavix 75 mg daily x3 months then aspirin alone 3. Pain Management: Tylenol as needed. Has some right forearm pain- refused warm compress.  4. Mood: Provide emotional support             -antipsychotic agents: N/A  -team providing ego support on daily basis 5. Neuropsych/dementia: This patient is  not capable of making decisions on his own behalf. 6. Skin/Wound Care: Routine skin checks 7. Fluids/Electrolytes/Nutrition: Routine in and outs. 8.  Hypertension.  Coreg 3.125 mg twice daily.    8/14: elevated. Increase coreg to 6.25mg  BID  8/15-16: improving control  9.  COPD.  Oxygen dependent.  Check oxygen saturations every shift 10.  Legally blind bilaterally.  Patient has assistance of son at home- due to macular degeneration 11.  History of tobacco and alcohol use.  Provide counseling 12.  Hyperlipidemia.    Continue Lipitor 13.  Questionable medical compliance.  Provide counseling with patient and family. 14.  Anemia  Hemoglobin 9.9 on 8/13 15. Abdominal pain resolved after BM 16. Weak RUE finger extensors- continue NMES  LOS: 4 days A FACE TO FACE EVALUATION WAS PERFORMED  Meredith Staggers 08/09/2020, 12:25 PM

## 2020-08-09 NOTE — Progress Notes (Signed)
Physical Therapy Session Note  Patient Details  Name: Nathan Hart MRN: [id number] Date of Birth: Dec 05, 1956  Today's Date: 08/09/2020 PT Individual Time: 8325-4982 + 1132-1200 PT Individual Time Calculation (min): 45 min +28 min  Short Term Goals: Week 1:  PT Short Term Goal 1 (Week 1): STGs=LTGs  Skilled Therapeutic Interventions/Progress Updates:      AM: Pt received supine in bed, agreeable to PT session. Pt denies pain. Pt resting spO2 95% on RA, denies lightheadedness/S.O.B. Pt performed supine<>sit with supervision with HOB elevated and sit<>stand with CGA and no AD, initial posterior lean noted however improves with cues and time for upright acclimation. Pt ambulated from room to main therapy gym with minA and no AD, cues for reducing posterior lean and visual guidance 2/2 vision deficits. Pt slightly short of breath from ambulation, attempted taking spO2 via portable pulse ox and dynamap however readings unable to be read due to poor perfusion. Thus, seated rest breaks provided throughout session for recovery. Pt with trial of gait with SPC to assist with stability during gait however pt reports feeling unsteady with addition of SPC, thus deferred further AD training with gait. Pt ambulated a total of 130ft + 2x149ft + 171ft with CGA/minA and no AD throughout session with cues throughout for visual scanning, reducing posterior lean, paced activity, and deep breathing. Pt performed the following there-ex: - 1x10 unsupported sit<>stands from low mat table with CGA and no AD - 1x10 elbow flex/ext with 3lb dumbbell - 1x10 supination/pronation with 3lb dumbbell - 1x10 grasp/finger ext around dumbbell  Pt returned to room and ended session supine in bed, bed rails up, bed alarm on, NT present.   PM: Pt received supine in bed, agreeable to PT session. Pt denies any pain. Pt unable to recall OT session when asked activities performed. Pt performed supine<>sit with supervision and was  able to don his shoes with minA via figure-4 technique (PT untied/tied laces), may benefit from elastic laces due to RUE weakness, will monitor. Pt performed sit<>stand with supervision and no AD, ambulated ~140ft from room to ADL room with CGA and no AD where he required prolonged seated rest 2/2 fatigue and shortness of breath. Attempted to monitor spO2 however portable oximeter not picking up reading. Pt reporting 6/10 RPE. After seated rest, pt ambulated in ADL room, requiring min cues for finding recliner where he was able to sit<>stand with SBA from recliner height. Pt then instructed to find and locate 3 items in kitchen - spoon, jello, and measuring cup. Pt able to recall 3/3 items but unable to locate any due to his significant visual deficits.  Pt then ambulated to his room from ADL room with CGA and intermittent minA with turns, cues for visual scanning paced activity. Pt unable to locate his room despite providing room number. He performed stand<>sit with supervision and doffed his shoes while seated EOB, sit<>supine with supervision. Bed rails up, bed alarm on, needs in reach.  Therapy Documentation Precautions:  Precautions Precautions: Fall Precaution Comments: legally blind Restrictions Weight Bearing Restrictions: No Pain: Pain Assessment Pain Scale: 0-10 Pain Score: 0-No pain    Therapy/Group: Individual Therapy  [name]  PT 08/09/2020, 9:43 AM

## 2020-08-10 ENCOUNTER — Inpatient Hospital Stay (HOSPITAL_COMMUNITY): Payer: Medicare Other | Admitting: Occupational Therapy

## 2020-08-10 ENCOUNTER — Inpatient Hospital Stay (HOSPITAL_COMMUNITY): Payer: Medicare Other

## 2020-08-10 ENCOUNTER — Inpatient Hospital Stay (HOSPITAL_COMMUNITY): Payer: Medicare Other | Admitting: Speech Pathology

## 2020-08-10 DIAGNOSIS — I10 Essential (primary) hypertension: Secondary | ICD-10-CM

## 2020-08-10 DIAGNOSIS — D649 Anemia, unspecified: Secondary | ICD-10-CM

## 2020-08-10 DIAGNOSIS — E785 Hyperlipidemia, unspecified: Secondary | ICD-10-CM

## 2020-08-10 DIAGNOSIS — F0390 Unspecified dementia without behavioral disturbance: Secondary | ICD-10-CM

## 2020-08-10 NOTE — Progress Notes (Addendum)
Speech Language Pathology Daily Session Note  Patient Details  Name: Nathan Hart MRN: [id number] Date of Birth: June 09, 1956  Today's Date: 08/10/2020 SLP Individual Time: 1210-1250 SLP Individual Time Calculation (min): 40 min  Short Term Goals: Week 1: SLP Short Term Goal 1 (Week 1): Patient will demonstrate problem solving for functional tasks (locating utensils, cups on tray, etc) with mod A. SLP Short Term Goal 2 (Week 1): Patient will be able to answer basic level hypothetical problem solving and reasoning questions with mod A to elaborate. SLP Short Term Goal 3 (Week 1): Patient will be able to describe activities he has completed in therapies (PT, OT, ST) with mod A cues to recall. SLP Short Term Goal 4 (Week 1): Patient will demonstrate adequate orientation to place, situation,approximate time of day with mod A. SLP Short Term Goal 5 (Week 1): Patient will demonstrate adequate safety awareness when performing functional problem solving tasks with mod A.  Skilled Therapeutic Interventions: Skilled treatment session focused on cognitive-linguistic goals. Upon arrival, patient was supine in bed but agreeable to participate in treatment session. Mod A verbal cues were required for problem solving during tray set-up and to follow directions with ambulating to the bathroom. Patient was continent of urine. SLP also facilitated session by providing Min A verbal cues for word-retrievel during a verbal description task with overall 75% accuracy. Total A was also required for orientation to date. Patient left upright in bed with alarm on and all needs within reach. Continue with current plan of care.      Pain Pain Assessment Pain Score: 0-No pain  Therapy/Group: Individual Therapy  Nathan Hart 08/10/2020, 1:02 PM

## 2020-08-10 NOTE — Progress Notes (Signed)
Physical Therapy Session Note  Patient Details  Name: Nathan Hart MRN: [id number] Date of Birth: 1956-05-29  Today's Date: 08/10/2020 PT Individual Time: 0800-0900 PT Individual Time Calculation (min): 60 min   Short Term Goals: Week 1:  PT Short Term Goal 1 (Week 1): STGs=LTGs  Skilled Therapeutic Interventions/Progress Updates:    Pt received sitting in recliner, NT present for handoff, pt agreeable to PT session. Pt denies pain. Pt in hospital gown and donned scrub pants/top with modA due to visual deficits, performed via threading technique. Shoes donned totalA for time management. Pt ambulated ~232ft from his room to therapy gym with minA and no AD, occasionally veering R. Cues for pacing, deep breathing, and visual scanning. Pt with increased work of breathing towards end of ambulation, reporting 10/10 on RPE scale, required 1-2 minutes of seated rest to recovery to a 4/10. Pt reports at baseline he does not ambulate very far and that was further than he normally ambulates. Pt performed BERG, scoring 33/56, which is a 9 point improvement from previous score 2 days ago of 24/56. Pt performed Nustep x4 min at workload of 3, RPE 8/10, requiring 2-3 minutes of seated recovery. Pt then ambulated back to his room (266ft) with minA and no AD where he performed stand<>sit with CGA, sit>supine with supervision and HOB flat, needs in reach, bed alarm on.  Patient demonstrates increased fall risk as noted by score of 33/56 on Berg Balance Scale.  (<36= high risk for falls, close to 100%; 37-45 significant >80%; 46-51 moderate >50%; 52-55 lower >25%)   Therapy Documentation Precautions:  Precautions Precautions: Fall Precaution Comments: legally blind Restrictions Weight Bearing Restrictions: No Vital Signs: Therapy Vitals Temp: 98.2 F (36.8 C) Temp Source: Oral Pulse Rate: 75 Resp: 18 BP: (!) 113/53 Patient Position (if appropriate): Lying Oxygen Therapy SpO2: 95 % O2 Device:  Room Air   Therapy/Group: Individual Therapy  Rosenda Geffrard P Kaicee Scarpino  PT 08/10/2020, 10:00 AM

## 2020-08-10 NOTE — Progress Notes (Signed)
Occupational Therapy Session Note  Patient Details  Name: Nathan Hart MRN: [id number] Date of Birth: October 23, 1956  Today's Date: 08/10/2020 OT Individual Time: 0930-1030 OT Individual Time Calculation (min): 60 min    Short Term Goals: Week 1:  OT Short Term Goal 1 (Week 1): STGs=LTGs due to ELOS  Skilled Therapeutic Interventions/Progress Updates:    pt received in bed and declined a shower as he had just put on clean clothing with PT. Pt ready for therapy to address RUE.  He sat to EOB, arm positioned on table and started to apply estim pads to arm.  Pt began leaning to the R and unable to verbalize.  layed pt back down immediately and contacted RN.  Assessed BP at 159/66 and RN came in room.  After resting supine a few minutes, pt able to verbalize and stated he was in a hospital.   Applied knee high TED hose.  Pt rested a few minutes and sat back up, tolerated sitting better.    Sitting at EOB with R forearm on table, used estim to facilitate finger extension. He has about 10% of ext range. Used empi with small muscle atrophy setting with 5 on and 5 sec off at 35 pps at intensity 30 for 15 min.   Pt worked on actively squeezing green foam block on off phase and used estim for a/arom to achieve full finger extension.   After estim, worked on grasping red pegs placed in foam board and then dropping pegs on table with modified grasp/release.  Using 2 # dowel bar, worked on seated chest presses.  Attempted overhead shoulder press but pt became too fatigued. Moved back to supine to rest.  Bed alarm set, all needs met.  Therapy Documentation Precautions:  Precautions Precautions: Fall Precaution Comments: legally blind Restrictions Weight Bearing Restrictions: No  Pain: Pain Assessment Pain Score: 0-No pain ADL: ADL Eating: Not assessed Grooming: Setup, Supervision/safety Where Assessed-Grooming: Sitting at sink Upper Body Bathing: Minimal assistance Where Assessed-Upper  Body Bathing: Shower Lower Body Bathing: Contact guard Where Assessed-Lower Body Bathing: Shower Upper Body Dressing: Minimal assistance, Minimal cueing Where Assessed-Upper Body Dressing: Chair Lower Body Dressing: Moderate assistance (CGA pants, mod shoes) Where Assessed-Lower Body Dressing: Chair Toileting: Not assessed Toilet Transfer: Therapist, music Method: Stand pivot Science writer: Radiographer, therapeutic: Not assessed Social research officer, government: Minimal assistance, Minimal cueing Social research officer, government Method: Heritage manager: Radio broadcast assistant, Grab bars    Therapy/Group: Individual Therapy  Java 08/10/2020, 10:08 AM

## 2020-08-10 NOTE — Progress Notes (Signed)
[place] PHYSICAL MEDICINE & REHABILITATION PROGRESS NOTE  Subjective/Complaints: Patient seen sitting up in bed this morning eating breakfast.  Good sitting balance noted.  He indicates he slept well overnight.  No reported issues overnight.  ROS: Denies CP, SOB, N/V/D  Objective: Vital Signs: Blood pressure (!) 113/53, pulse 75, temperature 98.2 F (36.8 C), temperature source Oral, resp. rate 18, SpO2 95 %. No results found. No results for input(s): WBC, HGB, HCT, PLT in the last 72 hours. No results for input(s): NA, K, CL, CO2, GLUCOSE, BUN, CREATININE, CALCIUM in the last 72 hours.  Physical Exam: BP (!) 113/53 (BP Location: Right Arm)   Pulse 75   Temp 98.2 F (36.8 C) (Oral)   Resp 18   SpO2 95%  Constitutional: No distress . Vital signs reviewed. HENT: Normocephalic.  Atraumatic. Eyes: EOMI. No discharge. Cardiovascular: No JVD.  RRR. Respiratory: Normal effort.  No stridor.  Bilateral clear to auscultation. GI: Non-distended.  BS +. Skin: Warm and dry.  Intact. Psych: Normal mood.  Normal behavior. Musc: No edema in extremities.  No tenderness in extremities. Neuro: Alert and oriented to person and hospital only. Motor: LUE: 5/5 proximal distal, unchanged RUE: 4/5 proximal distal, grasp 3/5, trace finger extension.  LLE: 5/5 proximal distal RLE: 4+/5 proximal and distal, unchanged  Assessment/Plan: 1. Functional deficits secondary to left posterior MCA/PCA infarcts which require 3+ hours per day of interdisciplinary therapy in a comprehensive inpatient rehab setting.  Physiatrist is providing close team supervision and 24 hour management of active medical problems listed below.  Physiatrist and rehab team continue to assess barriers to discharge/monitor patient progress toward functional and medical goals  Care Tool:  Bathing    Body parts bathed by patient: Right arm, Chest, Abdomen, Front perineal area, Buttocks, Right upper leg, Left upper leg, Right  lower leg, Left lower leg, Face   Body parts bathed by helper: Left arm     Bathing assist Assist Level: Minimal Assistance - Patient > 75%     Upper Body Dressing/Undressing Upper body dressing Upper body dressing/undressing activity did not occur (including orthotics): N/A What is the patient wearing?: Pull over shirt    Upper body assist Assist Level: Minimal Assistance - Patient > 75%    Lower Body Dressing/Undressing Lower body dressing    Lower body dressing activity did not occur: N/A What is the patient wearing?: Pants     Lower body assist Assist for lower body dressing: Contact Guard/Touching assist     Toileting Toileting Toileting Activity did not occur (Clothing management and hygiene only): N/A (no void or bm)  Toileting assist Assist for toileting: Minimal Assistance - Patient > 75%     Transfers Chair/bed transfer  Transfers assist     Chair/bed transfer assist level: Minimal Assistance - Patient > 75%     Locomotion Ambulation   Ambulation assist      Assist level: Contact Guard/Touching assist Assistive device: No Device Max distance: 65 ft   Walk 10 feet activity   Assist     Assist level: Contact Guard/Touching assist Assistive device: No Device   Walk 50 feet activity   Assist    Assist level: Contact Guard/Touching assist Assistive device: No Device    Walk 150 feet activity   Assist Walk 150 feet activity did not occur: Safety/medical concerns (fatigue)         Walk 10 feet on uneven surface  activity   Assist     Assist level: Minimal  Assistance - Patient > 75% Assistive device: Hand held assist   Wheelchair     Assist Will patient use wheelchair at discharge?: No             Wheelchair 50 feet with 2 turns activity    Assist            Wheelchair 150 feet activity     Assist           BP (!) 113/53 (BP Location: Right Arm)   Pulse 75   Temp 98.2 F (36.8 C) (Oral)    Resp 18   SpO2 95%   Medical Problem List and Plan: 1.  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  Continue CIR 2.  Antithrombotics: -DVT/anticoagulation: Lovenox             -antiplatelet therapy: Aspirin 325 mg daily and Plavix 75 mg daily x3 months then aspirin alone 3. Pain Management: Tylenol as needed.  4. Mood: Provide emotional support             -antipsychotic agents: N/A  -team providing ego support on daily basis 5. Neuropsych/dementia: This patient is  not capable of making decisions on his own behalf. 6. Skin/Wound Care: Routine skin checks 7. Fluids/Electrolytes/Nutrition: Routine in and outs. 8.  Hypertension.    Increased coreg to 6.25mg  BID  Improving on 8/17, continue to monitor and avoid overcorrection 9.  COPD.  Oxygen dependent.  Check oxygen saturations every shift  No breathing issues at present 10.  Legally blind bilaterally.  Patient has assistance of son at home- due to macular degeneration 11.  History of tobacco and alcohol use.  Provide counseling 12.  Hyperlipidemia.    Continue Lipitor 13.  Questionable medical compliance.  Provide counseling with patient and family. 14.  Anemia  Hemoglobin 9.9 on 8/13 17. Weak RUE finger extensors- continue NMES  LOS: 5 days A FACE TO FACE EVALUATION WAS PERFORMED  Hadley Soileau Lorie Phenix 08/10/2020, 9:40 AM

## 2020-08-10 NOTE — Plan of Care (Signed)
  Problem: Consults Goal: RH STROKE PATIENT EDUCATION Description: Patient will be aware of signs and symptoms of impending stroke and keep blood glucose controlled.  Outcome: Progressing   Problem: RH BOWEL ELIMINATION Goal: RH STG MANAGE BOWEL WITH ASSISTANCE Description: STG Manage Bowel with min Assistance. Outcome: Progressing Goal: RH STG MANAGE BOWEL W/MEDICATION W/ASSISTANCE Description: STG Manage Bowel with Medication with mod I Assistance. Outcome: Progressing   Problem: RH BLADDER ELIMINATION Goal: RH STG MANAGE BLADDER WITH ASSISTANCE Description: STG Manage Bladder With min Assistance Outcome: Progressing   Problem: RH SKIN INTEGRITY Goal: RH STG SKIN FREE OF INFECTION/BREAKDOWN Description: Pt will be free of skin breakdown/infection with supervision assist prior to DC Outcome: Progressing Goal: RH STG MAINTAIN SKIN INTEGRITY WITH ASSISTANCE Description: STG Maintain Skin Integrity With supervision Assistance. Outcome: Progressing   Problem: RH SAFETY Goal: RH STG ADHERE TO SAFETY PRECAUTIONS W/ASSISTANCE/DEVICE Description: STG Adhere to Safety Precautions With cues/reminders assistance Outcome: Progressing Goal: RH STG DECREASED RISK OF FALL WITH ASSISTANCE Description: STG Decreased Risk of Fall With cues/reminders Assistance. Outcome: Progressing   Problem: RH KNOWLEDGE DEFICIT Goal: RH STG INCREASE KNOWLEDGE OF HYPERTENSION Description: Pt will be able to demonstrate understanding of HTN management using booklets/handouts with min assist due to poor vision  Outcome: Progressing Goal: RH STG INCREASE KNOWLEGDE OF HYPERLIPIDEMIA Description: Pt will be able to demonstrate understanding of HLD management using booklets/handouts with min assist due to poor vision  Outcome: Progressing Goal: RH STG INCREASE KNOWLEDGE OF STROKE PROPHYLAXIS Description: Pt will be able to demonstrate understanding of stroke prevention using booklets/handouts with min assist due  to poor vision  Outcome: Progressing

## 2020-08-10 NOTE — Progress Notes (Signed)
Occupational Therapy Session Note  Patient Details  Name: Nathan Hart MRN: [id number] Date of Birth: 25-Apr-1956  Today's Date: 08/10/2020 OT Individual Time: 1545-1615 OT Individual Time Calculation (min): 30 min    Short Term Goals: Week 1:  OT Short Term Goal 1 (Week 1): STGs=LTGs due to ELOS  Skilled Therapeutic Interventions/Progress Updates:    Pt supine in bed, no c/o pain, agreeable to OT session.  Pt completed bed mobility with supervision.  Pt completed in room functional mobility using RW including focus on small space negotiation.  Pt needing mod assist to donn shoes due to low vision.  Pt needing CGA and mod TCs for RW around obstacles.  Pt attempting to leave RW prematurely when approaching surface to sit.  Educated pt on keeping RW in front of self until fully sitting.  Pt ambulated to sink with RW and completed oral care with SBA to retrieve toothpaste and turn water on.  Pt returned to EOB exhibiting slight shortness of breath.  Vitals Assessed: BP 132/76; pulse 72; O2 100%.  Pt requesting back to bed.  Sit to supine with supervision. Call bell in reach, bed alarm on.  Pt exhibiting difficulty recalling PLOF throughout session. Pt also demonstrates impaired activity tolerance.    Therapy Documentation Precautions:  Precautions Precautions: Fall Precaution Comments: legally blind Restrictions Weight Bearing Restrictions: No     Therapy/Group: Individual Therapy  [name] 08/10/2020, 4:51 PM

## 2020-08-11 ENCOUNTER — Inpatient Hospital Stay (HOSPITAL_COMMUNITY): Payer: Medicare Other

## 2020-08-11 ENCOUNTER — Inpatient Hospital Stay (HOSPITAL_COMMUNITY): Payer: Medicare Other | Admitting: Speech Pathology

## 2020-08-11 ENCOUNTER — Inpatient Hospital Stay (HOSPITAL_COMMUNITY): Payer: Medicare Other | Admitting: Occupational Therapy

## 2020-08-11 DIAGNOSIS — J449 Chronic obstructive pulmonary disease, unspecified: Secondary | ICD-10-CM

## 2020-08-11 NOTE — Progress Notes (Signed)
Patient ID: Nathan Hart, male   DOB: [birth year], 64 y.o.   MRN: [id number]   Team Conference Report to Patient/Family  Team Conference discussion was reviewed with the patient and caregiver, including goals, any changes in plan of care and target discharge date.  Patient and caregiver express understanding and are in agreement.  The patient has a target discharge date of 08/17/20.  Dyanne Iha 08/11/2020, 2:54 PM

## 2020-08-11 NOTE — Progress Notes (Signed)
Occupational Therapy Session Note  Patient Details  Name: Nathan Hart MRN: [id number] Date of Birth: 1956/06/09  Today's Date: 08/11/2020 OT Individual Time: 1355-1455 OT Individual Time Calculation (min): 60 min    Short Term Goals: Week 1:  OT Short Term Goal 1 (Week 1): STGs=LTGs due to ELOS  Skilled Therapeutic Interventions/Progress Updates:    Pt supine in bed with son at bedside.  Pt has no c/o pain, appearing apathetic but agreeable to OT session.  Pt needing encouragement from OT and son to participate in self care.  Pt completed supine to sit with supervision.  Sit to stand at [facility] needing max VCs and assist to grasp RW with right hand.  Pt exhibiting unsteadiness and reporting light headedness in standing.  Pt returned to supine with supervision.  Assessed vitals in supine, standing, and sitting: supine BP 127/72, pulse 88 bpm ; standing: BP 98/66, pulse 101; seated: 114/63, pulse 90.  Applied TED hose needing total assist.  Pt returned to standing and vitals assessed: BP 90/62, 100 bpm. Remainder of session completed at bedside due to low BP in standing despite TED hose.  Pt needing supervision to doff shirt.  Pt needing min assist for UB/LB bathing not including buttocks.  Pt completed LB dressing to doff/donn pants with CGA.  Pt returned to supine, call bell in reach, bed alarm on.  Pt presenting with post stroke apathy throughout session, needing increased time and repetitive cueing to facilitate increased pt initiation and decision making during self care tasks.  Therapy Documentation Precautions:  Precautions Precautions: Fall Precaution Comments: legally blind Restrictions Weight Bearing Restrictions: No   Therapy/Group: Individual Therapy  [name] 08/11/2020, 4:33 PM

## 2020-08-11 NOTE — Patient Care Conference (Signed)
Inpatient RehabilitationTeam Conference and Plan of Care Update Date: 08/11/2020   Time: 11:06 AM    Patient Name: Nathan Hart      Medical Record Number: [id number]  Date of Birth: Jul 13, 1956 Sex: Male         Room/Bed: 4W11C/4W11C-01 Payor Info: Payor: MEDICARE / Plan: MEDICARE PART A AND B / Product Type: *No Product type* /    Admit Date/Time:  08/05/2020  4:29 PM  Primary Diagnosis:  Left middle cerebral artery stroke [facility])  Hospital Problems: Principal Problem:   Left middle cerebral artery stroke [facility]) Active Problems:   Anemia   Dyslipidemia   Right hemiparesis ([place])   Dementia without behavioral disturbance [facility])    Expected Discharge Date: Expected Discharge Date: 08/17/20  Team Members Present: Physician leading conference: Dr. Delice Lesch Care Coodinator Present: Dorien Chihuahua, RN, BSN, CRRN;Christina Sampson Goon, [place] Nurse Present: Isla Pence, RN PT Present: Other (comment) Ginnie Smart, PT) OT Present: Meriel Pica, OT SLP Present: Jettie Booze, CF-SLP PPS Coordinator present : Ileana Ladd, Burna Mortimer, SLP     Current Status/Progress Goal Weekly Team Focus  Bowel/Bladder   cont bowel and bladder- can be incontinent of bladder at times. LBM 8/14  supervision assist  offer laxatives as needed, adhere to a  timed toileting scheduled   Swallow/Nutrition/ Hydration             ADL's   min A transfers, mod A self care  supervision transfers, min A self care  RUE NMR with estim, balance, visual compensation, pt/family education, ADL training   Mobility   supervision bed mobility, minA transfers, minA gait 159ft  supervision transfers/gait  endurance, balance, gait, general strengthening, energy conservation. Introduce RW?   Communication   Min expressive, Mod receptive  Min expressive, Mod receptive  word retrival, functional comprehension basic directions/mildly complex language, education   Safety/Cognition/ Behavioral Observations  Max  orientation to time, recall (long and short term), Mod-Max A functional problem solving  Mod A  functional problem solving, orientation, recall with strategies/aids, education   Pain   no pain reported  less than 3 out of 10  assess pain q shift and prn   Skin   abrasions to BLE  skin remains free of breakdown  assess skin q shift. Encourage mobilization in bed     Discharge Planning:  Discharging back home with son. Able to provide 24/7   Team Discussion: Decreased vision, confusion and poor balance and overall deconditioned state limiting progress. Patient on target to meet rehab goals: yes  *See Care Plan and progress notes for long and short-term goals.   Revisions to Treatment Plan:   Teaching Needs: Energy conservation tips, transfers, toileting, medications, etc.  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  Barriers to Discharge: Behavior;Medical stability   Possible Resolutions to [employer] Focus: Therapies, optimize BP meds, follow labs   Continued Need for Acute Rehabilitation Level of Care: The patient requires daily medical management by a physician with specialized training in physical medicine and rehabilitation for the following reasons: Direction of a multidisciplinary physical rehabilitation program to maximize functional independence : Yes Medical management of patient stability for increased activity during participation in an intensive rehabilitation regime.: Yes Analysis of  laboratory values and/or radiology reports with any subsequent need for medication adjustment and/or medical intervention. : Yes   I attest that I was present, lead the  team conference, and concur with the assessment and plan of the team.   Dorien Chihuahua B 08/11/2020, 4:13 PM

## 2020-08-11 NOTE — Progress Notes (Signed)
Physical Therapy Session Note  Patient Details  Name: Nathan Hart MRN: [id number] Date of Birth: September 08, 1956  Today's Date: 08/11/2020 PT Individual Time: 0800-0910 PT Individual Time Calculation (min): 70 min   Short Term Goals: Week 1:  PT Short Term Goal 1 (Week 1): STGs=LTGs  Skilled Therapeutic Interventions/Progress Updates:    Pt received supine in bed, sleeping on arrival, awakens easily to voice. Pt agreeable to PT session and pt denies any pain. Pt performed supine<>sit with supervision with  HOB slightly elevated, donned his shoes with setup A while seated EOB via figure-4 technique (shoes tied, pt did not tie/untie shoes). Pt performed sit<>stand from EOB height to RW with minA and ambulated from his room to day room with minA and RW, seated on Nustep. Pt reports 10/10 fatigue on RPE after ambulate, recovers with 3 minute seated rest. Pt performed Nustep at workload of 1 for a total of 29min15 sec.Used R handbrace to assist with maintaining grip on Nustep. The time was broken up into the following bouts due to reported 10/10 RPE after each bout, requiring ~3min seated rest before resumption of Nustep.  56min30sec 66min 51min 53min30sec 70min55sec 24min25sec  Pt then ambulated from day room towards therapy gym with minA and RW, requiring seated rest break at elevators 2/2 fatigue. He was able to then ambulate to therapy gym with minA and RW and after seated rest on mat table, performed the following there-ex: - 1x10 standing bicep curls with 3lb bar with underhand grip - 1x10 standing chest press with 3lb bar with underhand grip. Pt then performed unsupported standing toe taps on 6inch platform, requiring minA for stability. Increased difficulty with RLE descent and would often catch RLE on step while stepping off. Pt then ambulated from therapy gym back to his room with CGA/minA and RW, stand>sit with CGA, and sit>supine with supervision with HOB flat. Pt continues to be primarily  limited by poor vision, decreased balance, significant deconditioning, and R HB weakness. Pt ended session supine in bed, bed alarm on, needs in reach.   Therapy Documentation Precautions:  Precautions Precautions: Fall Precaution Comments: legally blind Restrictions Weight Bearing Restrictions: No   Therapy/Group: Individual Therapy   Deshanae Lindo P Lycia Sachdeva PT 08/11/2020, 9:51 AM

## 2020-08-11 NOTE — Progress Notes (Signed)
Speech Language Pathology Daily Session Note  Patient Details  Name: Nathan Hart MRN: [id number] Date of Birth: 1956/05/07  Today's Date: 08/11/2020 SLP Individual Time: 1030-1130 SLP Individual Time Calculation (min): 60 min  Short Term Goals: Week 1: SLP Short Term Goal 1 (Week 1): Patient will demonstrate problem solving for functional tasks (locating utensils, cups on tray, etc) with mod A. SLP Short Term Goal 2 (Week 1): Patient will be able to answer basic level hypothetical problem solving and reasoning questions with mod A to elaborate. SLP Short Term Goal 3 (Week 1): Patient will be able to describe activities he has completed in therapies (PT, OT, ST) with mod A cues to recall. SLP Short Term Goal 4 (Week 1): Patient will demonstrate adequate orientation to place, situation,approximate time of day with mod A. SLP Short Term Goal 5 (Week 1): Patient will demonstrate adequate safety awareness when performing functional problem solving tasks with mod A.  Skilled Therapeutic Interventions:   Patient seen for skilled ST intervention focusing on cognitive-linguistic goals. Patient was able to recall that he had an other therapy today and that he was "doing some sort of exercise". He also recalled that his daughter in law visited recently. He stated season as Fall, month as July and year as 2002. When asked how old he was he replied "27". After multiple trials (5-6) during the course of the session, patient finally stated month as "August". He became upset and "confused" when he could not answer orientation questions or basic level math calculation questions. He was able to identify an alphabet letter when SLP printed it on dry erase board with size approximately 80 point font. SLP directed patient to stand up from bed and walk to sink and turn water on. He initially tried pushing up on tray table (has wheels) and this resulted in him leaning heavily to his right side, requiring SLP to  recenter him in seated position in bed. He was able to then walk to sink with CGA and then return to bed. Patient continues to be a very poor historian but he did seem to indicate that he was having some difficulty with movement at home and that he only got out of bed or chair to use the bathroom. Patient was more engaged in discussions and tasks today, with affect being less flat, as he showed reasonable amount of frustrations. Patient continues to benefit from skilled ST services to maximize his potential prior to discharge.   Pain Pain Assessment Pain Scale: 0-10 Pain Score: 0-No pain  Therapy/Group: Individual Therapy  Sonia Baller, MA, CCC-SLP Speech Therapy

## 2020-08-11 NOTE — Progress Notes (Signed)
[place] PHYSICAL MEDICINE & REHABILITATION PROGRESS NOTE  Subjective/Complaints: Patient seen laying in bed this morning.  He states he slept well overnight.  But with therapies.  ROS: Denies CP, SOB, N/V/D  Objective: Vital Signs: Blood pressure (!) 151/93, pulse 90, temperature 97.6 F (36.4 C), resp. rate 16, SpO2 97 %. No results found. No results for input(s): WBC, HGB, HCT, PLT in the last 72 hours. No results for input(s): NA, K, CL, CO2, GLUCOSE, BUN, CREATININE, CALCIUM in the last 72 hours.  Physical Exam: BP (!) 151/93 (BP Location: Left Arm)   Pulse 90   Temp 97.6 F (36.4 C)   Resp 16   SpO2 97%  Constitutional: No distress . Vital signs reviewed. HENT: Normocephalic.  Atraumatic. Eyes: EOMI. No discharge. Cardiovascular: No JVD. Respiratory: Normal effort.  No stridor. GI: Non-distended. Skin: Warm and dry.  Intact. Psych: Flat, limited due to cognition Musc: No edema in extremities.  No tenderness in extremities. Motor: LUE: 5/5 proximal distal, stable RUE: 4/5 proximal distal, grasp 3/5, trace finger extension.  LLE: 5/5 proximal distal RLE: 4+/5 proximal and distal, unchanged  Assessment/Plan: 1. Functional deficits secondary to left posterior MCA/PCA infarcts which require 3+ hours per day of interdisciplinary therapy in a comprehensive inpatient rehab setting.  Physiatrist is providing close team supervision and 24 hour management of active medical problems listed below.  Physiatrist and rehab team continue to assess barriers to discharge/monitor patient progress toward functional and medical goals  Care Tool:  Bathing    Body parts bathed by patient: Right arm, Chest, Abdomen, Front perineal area, Buttocks, Right upper leg, Left upper leg, Right lower leg, Left lower leg, Face   Body parts bathed by helper: Left arm     Bathing assist Assist Level: Minimal Assistance - Patient > 75%     Upper Body Dressing/Undressing Upper body dressing  Upper body dressing/undressing activity did not occur (including orthotics): N/A What is the patient wearing?: Pull over shirt    Upper body assist Assist Level: Minimal Assistance - Patient > 75%    Lower Body Dressing/Undressing Lower body dressing    Lower body dressing activity did not occur: N/A What is the patient wearing?: Pants     Lower body assist Assist for lower body dressing: Contact Guard/Touching assist     Toileting Toileting Toileting Activity did not occur (Clothing management and hygiene only): N/A (no void or bm)  Toileting assist Assist for toileting: Minimal Assistance - Patient > 75%     Transfers Chair/bed transfer  Transfers assist     Chair/bed transfer assist level: Minimal Assistance - Patient > 75%     Locomotion Ambulation   Ambulation assist      Assist level: Contact Guard/Touching assist Assistive device: No Device Max distance: 65 ft   Walk 10 feet activity   Assist     Assist level: Contact Guard/Touching assist Assistive device: No Device   Walk 50 feet activity   Assist    Assist level: Contact Guard/Touching assist Assistive device: No Device    Walk 150 feet activity   Assist Walk 150 feet activity did not occur: Safety/medical concerns (fatigue)         Walk 10 feet on uneven surface  activity   Assist     Assist level: Minimal Assistance - Patient > 75% Assistive device: Hand held assist   Wheelchair     Assist Will patient use wheelchair at discharge?: No  Wheelchair 50 feet with 2 turns activity    Assist            Wheelchair 150 feet activity     Assist           BP (!) 151/93 (BP Location: Left Arm)   Pulse 90   Temp 97.6 F (36.4 C)   Resp 16   SpO2 97%   Medical Problem List and Plan: 1.  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  Continue CIR  Team conference today to discuss current and goals and coordination of care, home and environmental barriers, and discharge planning with nursing, case manager, and therapies. Please see conference note from today as well.  2.  Antithrombotics: -DVT/anticoagulation: Lovenox             -antiplatelet therapy: Aspirin 325 mg daily and Plavix 75 mg daily x3 months then aspirin alone 3. Pain Management: Tylenol as needed.  4. Mood: Provide emotional support             -antipsychotic agents: N/A  -team providing ego support on daily basis 5. Neuropsych/dementia: This patient is  not capable of making decisions on his own behalf. 6. Skin/Wound Care: Routine skin checks 7. Fluids/Electrolytes/Nutrition: Routine in and outs. 8.  Hypertension.    Increased coreg to 6.25mg  BID  Slightly labile, but overall relatively controlled on 8/18 9.  COPD.  Oxygen dependent.  Check oxygen saturations every shift  No breathing issues on 8/18 10.  Legally blind bilaterally.  Patient has assistance of son at home- due to macular degeneration 11.  History of tobacco and alcohol use.  Provide counseling 12.  Hyperlipidemia.    Continue Lipitor 13.  Questionable medical compliance.  Provide counseling with patient and family. 14.  Anemia  Hemoglobin 9.9 on 8/13 17. Weak RUE finger extensors- continue NMES  LOS: 6 days A FACE TO FACE EVALUATION WAS PERFORMED  Shemica Meath Lorie Phenix 08/11/2020, 10:48 AM

## 2020-08-12 ENCOUNTER — Inpatient Hospital Stay (HOSPITAL_COMMUNITY): Payer: Medicare Other

## 2020-08-12 ENCOUNTER — Inpatient Hospital Stay (HOSPITAL_COMMUNITY): Payer: Medicare Other | Admitting: Occupational Therapy

## 2020-08-12 ENCOUNTER — Inpatient Hospital Stay (HOSPITAL_COMMUNITY): Payer: Medicare Other | Admitting: Speech Pathology

## 2020-08-12 DIAGNOSIS — I951 Orthostatic hypotension: Secondary | ICD-10-CM

## 2020-08-12 NOTE — Progress Notes (Signed)
[place] PHYSICAL MEDICINE & REHABILITATION PROGRESS NOTE  Subjective/Complaints: Patient seen laying in bed this morning.  He states he slept well overnight.  But with therapies.  Patient seen laying in bed this morning.  He states he slept better overnight.  ROS: Denies CP, SOB, N/V/D  Objective: Vital Signs: Blood pressure (!) 111/51, pulse 86, temperature (!) 97.1 F (36.2 C), resp. rate 17, SpO2 95 %. No results found. No results for input(s): WBC, HGB, HCT, PLT in the last 72 hours. No results for input(s): NA, K, CL, CO2, GLUCOSE, BUN, CREATININE, CALCIUM in the last 72 hours.  Physical Exam: BP (!) 111/51 (BP Location: Right Arm)   Pulse 86   Temp (!) 97.1 F (36.2 C)   Resp 17   SpO2 95%  Constitutional: No distress . Vital signs reviewed. HENT: Normocephalic.  Atraumatic. Eyes: EOMI. No discharge. Cardiovascular: No JVD. Respiratory: Normal effort.  No stridor. GI: Non-distended. Skin: Warm and dry.  Intact. Psych: Flat. Musc: No edema in extremities.  No tenderness in extremities. Neuro: Alert Motor:  LUE: 5/5 proximal distal, stable RUE: 4+/5 shoulder abduction, elbow flexion/extension, handgrip, finger extension 0/5 LLE: 5/5 proximal distal RLE: 4+/5 proximal and distal, stable  Assessment/Plan: 1. Functional deficits secondary to left posterior MCA/PCA infarcts which require 3+ hours per day of interdisciplinary therapy in a comprehensive inpatient rehab setting.  Physiatrist is providing close team supervision and 24 hour management of active medical problems listed below.  Physiatrist and rehab team continue to assess barriers to discharge/monitor patient progress toward functional and medical goals  Care Tool:  Bathing    Body parts bathed by patient: Right arm, Chest, Abdomen, Front perineal area, Right upper leg, Left upper leg, Right lower leg, Left lower leg, Face   Body parts bathed by helper: Left arm Body parts n/a: Buttocks   Bathing  assist Assist Level: Minimal Assistance - Patient > 75%     Upper Body Dressing/Undressing Upper body dressing Upper body dressing/undressing activity did not occur (including orthotics): N/A What is the patient wearing?: Pull over shirt    Upper body assist Assist Level: Supervision/Verbal cueing    Lower Body Dressing/Undressing Lower body dressing    Lower body dressing activity did not occur: N/A What is the patient wearing?: Pants     Lower body assist Assist for lower body dressing: Contact Guard/Touching assist     Toileting Toileting Toileting Activity did not occur (Clothing management and hygiene only): N/A (no void or bm)  Toileting assist Assist for toileting: Minimal Assistance - Patient > 75% (urinal placement)     Transfers Chair/bed transfer  Transfers assist     Chair/bed transfer assist level: Minimal Assistance - Patient > 75%     Locomotion Ambulation   Ambulation assist      Assist level: Contact Guard/Touching assist Assistive device: No Device Max distance: 65 ft   Walk 10 feet activity   Assist     Assist level: Contact Guard/Touching assist Assistive device: No Device   Walk 50 feet activity   Assist    Assist level: Contact Guard/Touching assist Assistive device: No Device    Walk 150 feet activity   Assist Walk 150 feet activity did not occur: Safety/medical concerns (fatigue)         Walk 10 feet on uneven surface  activity   Assist     Assist level: Minimal Assistance - Patient > 75% Assistive device: Hand held assist   Wheelchair  Assist Will patient use wheelchair at discharge?: No             Wheelchair 50 feet with 2 turns activity    Assist            Wheelchair 150 feet activity     Assist           BP (!) 111/51 (BP Location: Right Arm)   Pulse 86   Temp (!) 97.1 F (36.2 C)   Resp 17   SpO2 95%   Medical Problem List and Plan: 1.  Right-sided  hemiparesis secondary to left posterior MCA and left PCA infarct secondary to left CCA occlusion, large vessel disease source as well as history of right carotid stenting September 2020.  Continue CIR 2.  Antithrombotics: -DVT/anticoagulation: Lovenox             -antiplatelet therapy: Aspirin 325 mg daily and Plavix 75 mg daily x3 months then aspirin alone 3. Pain Management: Tylenol as needed.  4. Mood/dementia: Provide emotional support             -antipsychotic agents: N/A  -team providing ego support on daily basis 5. Neuropsych/dementia: This patient is  not capable of making decisions on his own behalf. 6. Skin/Wound Care: Routine skin checks 7. Fluids/Electrolytes/Nutrition: Routine in and outs. 8.  Hypertension with orthostasis.    Increased coreg to 6.25mg  BID  Controlled on 8/19 9.  COPD.  Oxygen dependent.  Check oxygen saturations every shift  No breathing issues on 8/19 10.  Legally blind bilaterally.  Patient has assistance of son at home- due to macular degeneration 11.  History of tobacco and alcohol use.  Provide counseling 12.  Hyperlipidemia.    Continue Lipitor 13.  Questionable medical compliance.  Provide counseling with patient and family. 14.  Anemia  Hemoglobin 9.9 on 8/13, labs ordered for tomorrow 17. Weak RUE finger extensors- continue NMES  LOS: 7 days A FACE TO FACE EVALUATION WAS PERFORMED  Itati Brocksmith Lorie Phenix 08/12/2020, 12:42 PM

## 2020-08-12 NOTE — Progress Notes (Addendum)
Patient ID: Nathan Hart, male   DOB: [birth year], 64 y.o.   MRN: [id number]   Rolling Walker ordered through [facility].

## 2020-08-12 NOTE — Progress Notes (Signed)
Physical Therapy Session Note  Patient Details  Name: Nathan Hart MRN: [id number] Date of Birth: February 04, 1956  Today's Date: 08/12/2020 PT Individual Time: 0800-0900 PT Individual Time Calculation (min): 60 min   Short Term Goals: Week 1:  PT Short Term Goal 1 (Week 1): STGs=LTGs  Skilled Therapeutic Interventions/Progress Updates:    Pt received supine in bed, agreeable to PT session, denies pain. Pt with flat affect throughout session, anxious to go home. Pt able to recall son's visit yesterday where his son provided clothes. Pt unable to recall last night's meal or activities in yesterday's physical therapy session. Pt performed supine<>sit with supervision, donned B shoes with setupA while seated EOB. Therapist donned TED hose with totalA for time management. Pt denies any lightheadedness/dizziness throughout session. Continues to demonstrate significant general deconditioning which may likely be pre-morbid to his stroke. Frequent and extended rest breaks throughout session required for recovery. Educated patient on energy conservation levels, trying to use RPE scale for paced activity. Pt will be slightly impulsive/rushed when he gets fatigued and his falls risk increases. Pt performed sit<>stand with CGA and RW, ambulated from his room to elevator chair with CGA and RW, seated rest break prior to finishing the ambulation to main therapy gym with CGA and RW. Pt then navigated up/down x4 steps with minA and B HR support, cues for advancing RUE on HR and sequencing/general safety. Pt then performed lateral stepping 2x81ft L<>R without BUE support, focusing on increasing B step length, upright posture, and balance. After seated rest, pt performed backward walking without BUE support with minA x30ft, cues for increasing B step length, reducing posterior lean, and widening BOS. Pt then performed lateral step ups on 4inch platform without BUE supprt for 45 second bouts each leg (rest break provided  b/w sets). Pt then ambulated back to his room with CGA and RW. Pt continent of voiding while standing with urinal, therapist providing CGA for stability. Pt ended session R sidelying in bed, needs in reach, bed alarm on.  Therapy Documentation Precautions:  Precautions Precautions: Fall Precaution Comments: legally blind Restrictions Weight Bearing Restrictions: No General:   Vital Signs: Therapy Vitals Temp: (!) 97.1 F (36.2 C) Pulse Rate: 86 Resp: 17 BP: (!) 111/51 Patient Position (if appropriate): Lying Oxygen Therapy SpO2: 95 % O2 Device: Room Air  Therapy/Group: Individual Therapy  Tagen Brethauer P Tashyra Adduci PT 08/12/2020, 8:39 AM

## 2020-08-12 NOTE — Progress Notes (Signed)
Speech Language Pathology Weekly Progress and Session Note  Patient Details  Name: Nathan Hart MRN: [id number] Date of Birth: 1956/07/02  Beginning of progress report period: August 05, 2020 End of progress report period: August 12, 2020  Today's Date: 08/12/2020 SLP Individual Time: 1300-1355 SLP Individual Time Calculation (min): 55 min  Short Term Goals: Week 1: SLP Short Term Goal 1 (Week 1): Patient will demonstrate problem solving for functional tasks (locating utensils, cups on tray, etc) with mod A. SLP Short Term Goal 1 - Progress (Week 1): Not met SLP Short Term Goal 2 (Week 1): Patient will be able to answer basic level hypothetical problem solving and reasoning questions with mod A to elaborate. SLP Short Term Goal 2 - Progress (Week 1): Not met SLP Short Term Goal 3 (Week 1): Patient will be able to describe activities he has completed in therapies (PT, OT, ST) with mod A cues to recall. SLP Short Term Goal 3 - Progress (Week 1): Not met SLP Short Term Goal 4 (Week 1): Patient will demonstrate adequate orientation to place, situation,approximate time of day with mod A. SLP Short Term Goal 4 - Progress (Week 1): Not met SLP Short Term Goal 5 (Week 1): Patient will demonstrate adequate safety awareness when performing functional problem solving tasks with mod A. 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 He has been slightly more responsive when asking information about himself and his family but continues to not initiate or elaborate without cues. Patient continues to exhibit short term memory/recall which is very minimal and although he is able to recall after frequent repetitions, he does not demonstrate any retention beyond 1-2 minutes. Patient  continues to benefit from skilled ST treatment to maximize function prior to discharge.   Intensity: Minumum of 1-2 x/day, 30 to 90 minutes Frequency: 3 to 5 out of 7 days Duration/Length of Stay: 7-10 days Treatment/Interventions: Cognitive remediation/compensation;Speech/Language facilitation;Environmental controls;Patient/family education;Functional tasks;Cueing hierarchy   Daily Session  Skilled Therapeutic Interventions: Patient seen to address cognitive function goals. He was very difficult to engage in session today and he did have a few instances of becoming emotional and tearing up and saying "I just want to go home". Patient was not oriented to time and today he did not demonstrate adequate attempts to even guess at time. When asked month, he would respond "7th?" and then would stop attempting. When SLP asked patient to try to sit up on edge of bed, he started to do so, but then after having some difficulty supporting himself, he said, "forget this" and lay back down. Patient continues with a very flat affect and is apathetic towards most activities or tasks presented.      General    Pain Pain Assessment Pain Scale: 0-10 Pain Score: 0-No pain  Therapy/Group: Individual Therapy   Sonia Baller, MA, CCC-SLP 08/12/20 4:35 PM

## 2020-08-12 NOTE — Progress Notes (Signed)
Occupational Therapy Session Note  Patient Details  Name: Nathan Hart MRN: [id number] Date of Birth: [birth year]  Today's Date: 08/12/2020 OT Individual Time: 1000-1108 OT Individual Time Calculation (min): 68 min    Short Term Goals: Week 1:  OT Short Term Goal 1 (Week 1): STGs=LTGs due to ELOS  Skilled Therapeutic Interventions/Progress Updates:    Pt supine in bed, no c/o pain, apathetic but agreeable to OT session.  Pt reporting "I don't know" when asked if he would like to bathe at the sink or in the shower.  Pt needing encouragement, increased time, and repetitive VCs for simple decision making.  Pt states he would like to bathe at sink. Orthostatic BP taken supine: 130/69 pulse 72; sitting 92/60 pulse 79 with reports of light headedness (with TED hose donned); pt returned to supine 145/64 pulse 65.  Nurse made aware of vitals and PA notified.  Remainder of session completed in semi fowler position at bed level BP taken during treatment 126/67 pulse 70 bpm.    Pt brushed teeth with supervision and max VCs and TCs to facilitate increased right handed use.  Provided hand over hand to squeeze toothpaste onto toothbrush, however pt opted to brush teeth using left hand despite VCs for right hand use.  Pt reporting he is left handed baseline (per chart, pt is right hand dominant).    Pt participated in BUE strengthening using 2 lb dowel to complete sh. FF and biceps curls 3 x 10 reps.  Intermittent TCs needed to reposition right hand on dowel and increase active grip. Re-orientation completed to month, day of week, year, and names of close family members.  Pt with questionnable comprehension to simple questions regarding time. For example, when pt asked what month is it? Pt responding with "fifty four".  Pt also exhibited poor immediate recall when provided with correct month and asked to recall, pt again reporting with "2012". Pt also unable to remember ex-wife's name or daughters name, but  able to remember both sons and girlfriend's name.    Pt participated in [facility] table top activity to grasp medium dowels and place on board in specific slot.  Pt required hand over hand to grasp with thumb and index and place.  Pt quick to appear frustrated throughout tasks with some difficulty, needing encouragement to continue to participate.  Call bell in reach, bed alarm on.  Therapy Documentation Precautions:  Precautions Precautions: Fall Precaution Comments: legally blind Restrictions Weight Bearing Restrictions: No   Therapy/Group: Individual Therapy  [name] 08/12/2020, 12:40 PM

## 2020-08-13 ENCOUNTER — Encounter (HOSPITAL_COMMUNITY): Payer: Medicare Other | Admitting: Occupational Therapy

## 2020-08-13 ENCOUNTER — Inpatient Hospital Stay (HOSPITAL_COMMUNITY): Payer: Medicare Other

## 2020-08-13 ENCOUNTER — Inpatient Hospital Stay (HOSPITAL_COMMUNITY): Payer: Medicare Other | Admitting: Occupational Therapy

## 2020-08-13 ENCOUNTER — Ambulatory Visit (HOSPITAL_COMMUNITY): Payer: Medicare Other

## 2020-08-13 ENCOUNTER — Encounter (HOSPITAL_COMMUNITY): Payer: Medicare Other | Admitting: Speech Pathology

## 2020-08-13 DIAGNOSIS — D62 Acute posthemorrhagic anemia: Secondary | ICD-10-CM

## 2020-08-13 LAB — CBC WITH DIFFERENTIAL/PLATELET
Abs Immature Granulocytes: 0.02 10*3/uL (ref 0.00–0.07)
Basophils Absolute: 0.1 10*3/uL (ref 0.0–0.1)
Basophils Relative: 1 %
Eosinophils Absolute: 0.3 10*3/uL (ref 0.0–0.5)
Eosinophils Relative: 5 %
HCT: 27.1 % — ABNORMAL LOW (ref 39.0–52.0)
Hemoglobin: 7.6 g/dL — ABNORMAL LOW (ref 13.0–17.0)
Immature Granulocytes: 0 %
Lymphocytes Relative: 17 %
Lymphs Abs: 1 10*3/uL (ref 0.7–4.0)
MCH: 21.1 pg — ABNORMAL LOW (ref 26.0–34.0)
MCHC: 28 g/dL — ABNORMAL LOW (ref 30.0–36.0)
MCV: 75.3 fL — ABNORMAL LOW (ref 80.0–100.0)
Monocytes Absolute: 0.4 10*3/uL (ref 0.1–1.0)
Monocytes Relative: 6 %
Neutro Abs: 4 10*3/uL (ref 1.7–7.7)
Neutrophils Relative %: 71 %
Platelets: 228 10*3/uL (ref 150–400)
RBC: 3.6 MIL/uL — ABNORMAL LOW (ref 4.22–5.81)
RDW: 19.8 % — ABNORMAL HIGH (ref 11.5–15.5)
WBC: 5.8 10*3/uL (ref 4.0–10.5)
nRBC: 0 % (ref 0.0–0.2)

## 2020-08-13 LAB — CBC
HCT: 29 % — ABNORMAL LOW (ref 39.0–52.0)
Hemoglobin: 8.1 g/dL — ABNORMAL LOW (ref 13.0–17.0)
MCH: 21 pg — ABNORMAL LOW (ref 26.0–34.0)
MCHC: 27.9 g/dL — ABNORMAL LOW (ref 30.0–36.0)
MCV: 75.1 fL — ABNORMAL LOW (ref 80.0–100.0)
Platelets: 278 10*3/uL (ref 150–400)
RBC: 3.86 MIL/uL — ABNORMAL LOW (ref 4.22–5.81)
RDW: 19.9 % — ABNORMAL HIGH (ref 11.5–15.5)
WBC: 7.7 10*3/uL (ref 4.0–10.5)
nRBC: 0 % (ref 0.0–0.2)

## 2020-08-13 NOTE — Progress Notes (Signed)
Speech Language Pathology Daily Session Note  Patient Details  Name: Nathan Hart MRN: [id number] Date of Birth: 1956/12/21  Today's Date: 08/13/2020 SLP Individual Time: 1300-1345 SLP Individual Time Calculation (min): 45 min  Short Term Goals: Week 2: SLP Short Term Goal 1 (Week 2): STG's=LTG's due to ELOS  Skilled Therapeutic Interventions:   Patient seen with son [name] present with family education. [name] stated that he has previously been educated about post stroke care when his Mom required it after a stroke. He stated that he has already removed thresholds from floor, removed rugs and setup his Dad's room for minimal amount of furniture to reduce fall risk. SLP discussed patient with son, informing him that progress has been minimal and that patient has not been participating fully and appears apathetic. [name] stated that his Dad had some memory issues before and is "stubborn" and not very outgoing or conversational but that he is definitely far from his baseline. [name] feels that he and other family members who will be providing 24 hour supervision and care, are ready and equipped to care for patient at this time. He requested moving the discharge date to earlier (currently set for 8/24) as he feels that his Dad will do better being at home and receiving outpatient therapies. SLP relayed this to the therapy team. Patient himself was sitting up at edge of bed but no significant change in his demeanor or mood with son present, aside from him smiling when son said he was stubborn. Patient continues to be very slow to respond, unable to demonstrate delayed recall or time orientation and with poor attention and active participation in any tasks. Goals have been downgraded to mod-maximal. Patient continues to benefit from skilled ST treatment prior to discharge.  Pain Pain Assessment Pain Scale: Faces Faces Pain Scale: No hurt  Therapy/Group: Individual Therapy  Sonia Baller,  MA, CCC-SLP Speech Therapy

## 2020-08-13 NOTE — Progress Notes (Signed)
Physical Therapy Session Note  Patient Details  Name: Nathan Hart MRN: [id number] Date of Birth: [birth year]  Today's Date: 08/13/2020 PT Individual Time: 1400-1430 PT Individual Time Calculation (min): 30 min   Short Term Goals: Week 1:  PT Short Term Goal 1 (Week 1): STGs=LTGs  Skilled Therapeutic Interventions/Progress Updates:    Pt received supine in bed, son [name], at bedside for family ed. Pt denies pain on arrival. Son educated on pt's current status of mobility, his barriers to DC, and home safety training. Son reports that house is setup appropriately for ease of access with clutter removed, he will provide 24/7 S/A. Son reports that at baseline, pt is very sedentary and is limited to only household mobility and he performs better with familiar environments. Explained to son concern of visual deficits in combination with R neglect impacting his ambulation and balance, as well as his significant deconditioning. Son verbalized understanding however he feels pt is at baseline and requests DC soon. Pt in agreement. Began to initiate OOB mobility with patient, performed supine<>sit with supervision with HOB flat, maintained seated EOB with supervision without BUE support. Pt denies lightheadedness/dizzines. Instructed patient to stand to RW and upon initial standing he began to collapse to R with decreased responsiveness. When asked, pt stated he felt dizzy, decreased palor noted. Returned pt supine, BP orthostatics assessed where BP dropped from 158/67 in supine to 116/69 in seated. Unable to tolerate standing for vitals. Pt ended session supine in bed, son at bedside, needs in reach. RN and MD notified of BP concerns.   Therapy Documentation Precautions:  Precautions Precautions: Fall Precaution Comments: legally blind Restrictions Weight Bearing Restrictions: No Vital Signs: Therapy Vitals Temp: 97.6 F (36.4 C) Pulse Rate: 77 Resp: 16 BP: 138/68 Patient Position (if  appropriate): Sitting Oxygen Therapy O2 Device: Room Air  Therapy/Group: Individual Therapy  [name] PT 08/13/2020, 3:46 PM

## 2020-08-13 NOTE — Progress Notes (Signed)
Occupational Therapy Weekly Progress Note  Patient Details  Name: Nathan Hart MRN: [id number] Date of Birth: [birth year]  Beginning of progress report period: August 06, 2020 End of progress report period: August 13, 2020  Today's Date: 08/13/2020 OT Individual Time: 1324-4010 OT Individual Time Calculation (min): 45 min    Patient has met 3 of 8 long term goals.  Short term goals not set due to estimated length of stay.    Patient continues to demonstrate the following deficits: muscle weakness, decreased cardiorespiratoy endurance, unbalanced muscle activation and decreased coordination, decreased visual acuity, decreased attention, decreased awareness, decreased problem solving, decreased safety awareness, decreased memory and delayed processing and decreased sitting balance, decreased standing balance, decreased postural control, hemiplegia and decreased balance strategies and therefore will continue to benefit from skilled OT intervention to enhance overall performance with BADL.  See Patient's Care Plan for progression toward long term goals.  Patient progressing toward long term goals..  Continue plan of care.   Pt has goals set at supervision for transfers and min a with self care.  He fluctuates from day to day and can often do things at a min A level, but other days he is not able to attend well and needs more A.   Skilled Therapeutic Interventions/Progress Updates:    Pt received in bed and agreeable to a shower. Checked orthostatic vitals with no significant change with positional changes.  Min A to ambulate to toilet to guide pt due to visual deficits.  Pt doffed clothing with min A.  Stood to step to shower and 4 steps from shower he began leaning and falling to the R.   Needed max A to safely get to bench.   NT called in to check vitals. On bench, needed mod -max with bathing as pt just not able to engage and only stating, " I feel funny".  +2 for safety, ambulated back  to EOB and needed mod- max with dressing today. Thigh high TEDs donned. Pt resting in bed with all needs met, alarm set.   Therapy Documentation Precautions:  Precautions Precautions: Fall Precaution Comments: legally blind Restrictions Weight Bearing Restrictions: No Pain:  no c/o pain ADL: ADL Eating: Not assessed Grooming: Setup, Supervision/safety Where Assessed-Grooming: Sitting at sink Upper Body Bathing: Minimal assistance Where Assessed-Upper Body Bathing: Shower Lower Body Bathing: Contact guard Where Assessed-Lower Body Bathing: Shower Upper Body Dressing: Minimal assistance, Minimal cueing Where Assessed-Upper Body Dressing: Chair Lower Body Dressing: Moderate assistance (CGA pants, mod shoes) Where Assessed-Lower Body Dressing: Chair Toileting: Not assessed Toilet Transfer: Therapist, music Method: Stand pivot Science writer: Radiographer, therapeutic: Not assessed Social research officer, government: Minimal assistance, Minimal cueing Social research officer, government Method: Heritage manager: Radio broadcast assistant, Grab bars   Therapy/Group: Individual Therapy  [place] 08/13/2020, 1:07 PM

## 2020-08-13 NOTE — Progress Notes (Signed)
Occupational Therapy Session Note  Patient Details  Name: Nathan Hart MRN: [id number] Date of Birth: February 22, 1956  Today's Date: 08/13/2020 OT Individual Time: 1450-1530 OT Individual Time Calculation (min): 40 min    Short Term Goals: Week 1:  OT Short Term Goal 1 (Week 1): STGs=LTGs due to ELOS  Skilled Therapeutic Interventions/Progress Updates:    1:1 Family education with pt's son. Son had assisted pt in getting dressed prior to arrival. Pt's son reported feeling comfortable with assisting the pt at home. Pt was wearing TEDS in session  BP in supine 166/68 In sitting 128/71 In sitting donned ACE WRAPS but even after sitting for long pd of time pt stood BP dropped to 81/71 with symptoms.  Discussed safety at home with pt's dec memory, decr vision, decr balance, and encouragement of right hand function and encouragement of use of RW in the home with contact guard. Also discussed remaining with the patient with any mobility due to BP issues.   Pt declined mobility after standing. Transferred contact guard into recliner and left resting in the recliner.     Therapy Documentation Precautions:  Precautions Precautions: Fall Precaution Comments: legally blind Restrictions Weight Bearing Restrictions: No General: General PT Missed Treatment Reason: Patient ill (Comment) (Orthostatic hypotension, unable to tolerate OOB mobility) Vital Signs: Therapy Vitals Temp: 97.6 F (36.4 C) Pulse Rate: 77 Resp: 16 BP: 138/68 Patient Position (if appropriate): Sitting Oxygen Therapy O2 Device: Room Air Pain:  no c/o pain just dizziness with standing   Therapy/Group: Individual Therapy  Willeen Cass [facility] 08/13/2020, 3:58 PM

## 2020-08-13 NOTE — Plan of Care (Signed)
Problem: RH Memory Goal: LTG Patient will demonstrate ability for day to day (SLP) Description: LTG:   Patient will demonstrate ability for day to day recall/carryover during cognitive/linguistic activities with assist  (SLP) Outcome: Not Applicable   Problem: RH Awareness Goal: LTG: Patient will demonstrate awareness during functional activites type of (SLP) Description: LTG: Patient will demonstrate awareness during functional activites type of (SLP) Outcome: Not Applicable Flowsheets (Taken 08/13/2020 1613) LTG: Patient will demonstrate awareness during cognitive/linguistic activities with assistance of (SLP): (patient's progress slower than anticipated) Maximal Assistance - Patient 25 - 49% Note: patient's progress slower than anticipated   Problem: RH Cognition - SLP Goal: RH LTG Patient will demonstrate orientation with cues Description:  LTG:  Patient will demonstrate orientation to person/place/time/situation with cues (SLP)   Outcome: Not Applicable Flowsheets (Taken 08/13/2020 1613) LTG Patient will demonstrate orientation to:  Place  Situation  Time LTG: Patient will demonstrate orientation using cueing (SLP): Moderate Assistance - Patient 50 - 74%   Problem: RH Comprehension Communication Goal: LTG Patient will comprehend basic/complex auditory (SLP) Description: LTG: Patient will comprehend basic/complex auditory information with cues (SLP). Outcome: Not Applicable Flowsheets (Taken 08/13/2020 1613) LTG: Patient will comprehend: Basic auditory information LTG: Patient will comprehend auditory information with cueing (SLP): (patient's progress slower than anticipated) -- Note: patient's progress slower than anticipated   Problem: RH Expression Communication Goal: LTG Patient will express needs/wants via multi-modal(SLP) Description: LTG:  Patient will express needs/wants via multi-modal communication (gestures/written, etc) with cues (SLP) Outcome: Not Applicable Goal:  LTG Patient will verbally express basic/complex needs(SLP) Description: LTG:  Patient will verbally express basic/complex needs, wants or ideas with cues  (SLP) Outcome: Not Applicable Flowsheets (Taken 08/13/2020 1613) LTG: Patient will verbally express basic/complex needs, wants or ideas (SLP): (patient's progress slower than anticipated) Moderate Assistance - Patient 50 - 74% Note: patient's progress slower than anticipated Goal: LTG Patient will increase speech intelligibility (SLP) Description: LTG: Patient will increase speech intelligibility at word/phrase/conversation level with cues, % of the time (SLP) Outcome: Not Applicable Goal: LTG Patient will increase word finding of common (SLP) Description: LTG:  Patient will increase word finding of common objects/daily info/abstract thoughts with cues using compensatory strategies (SLP). Outcome: Not Applicable   Problem: RH Problem Solving Goal: LTG Patient will demonstrate problem solving for (SLP) Description: LTG:  Patient will demonstrate problem solving for basic/complex daily situations with cues  (SLP) Outcome: Not Applicable Flowsheets (Taken 08/13/2020 1613) LTG Patient will demonstrate problem solving for: (patient's progress slower than anticipated) Moderate Assistance - Patient 50 - 74% Note: patient's progress slower than anticipated   Problem: RH Cognition - SLP Goal: RH LTG Patient will demonstrate orientation with cues Description:  LTG:  Patient will demonstrate orientation to person/place/time/situation with cues (SLP)   Outcome: Not Applicable Flowsheets (Taken 08/13/2020 1613) LTG Patient will demonstrate orientation to:  Place  Situation  Time LTG: Patient will demonstrate orientation using cueing (SLP): Moderate Assistance - Patient 50 - 74%   Problem: RH Comprehension Communication Goal: LTG Patient will comprehend basic/complex auditory (SLP) Description: LTG: Patient will comprehend basic/complex auditory  information with cues (SLP). Outcome: Not Applicable Flowsheets (Taken 08/13/2020 1613) LTG: Patient will comprehend: Basic auditory information LTG: Patient will comprehend auditory information with cueing (SLP): (patient's progress slower than anticipated) --   Problem: RH Expression Communication Goal: LTG Patient will verbally express basic/complex needs(SLP) Description: LTG:  Patient will verbally express basic/complex needs, wants or ideas with cues  (SLP) Outcome: Not Applicable Flowsheets (Taken 08/13/2020  1613) LTG: Patient will verbally express basic/complex needs, wants or ideas (SLP): (patient's progress slower than anticipated) Moderate Assistance - Patient 50 - 74% Note: patient's progress slower than anticipated   Problem: RH Problem Solving Goal: LTG Patient will demonstrate problem solving for (SLP) Description: LTG:  Patient will demonstrate problem solving for basic/complex daily situations with cues  (SLP) Outcome: Not Applicable Flowsheets (Taken 08/13/2020 1613) LTG Patient will demonstrate problem solving for: (patient's progress slower than anticipated) Moderate Assistance - Patient 50 - 74% Note: patient's progress slower than anticipated   Problem: RH Memory Goal: LTG Patient will demonstrate ability for day to day (SLP) Description: LTG:   Patient will demonstrate ability for day to day recall/carryover during cognitive/linguistic activities with assist  (SLP) Outcome: Not Applicable   Problem: RH Awareness Goal: LTG: Patient will demonstrate awareness during functional activites type of (SLP) Description: LTG: Patient will demonstrate awareness during functional activites type of (SLP) Outcome: Not Applicable Flowsheets (Taken 08/13/2020 1613) LTG: Patient will demonstrate awareness during cognitive/linguistic activities with assistance of (SLP): (patient's progress slower than anticipated) Maximal Assistance - Patient 25 - 49% Note: patient's progress slower  than anticipated

## 2020-08-13 NOTE — Progress Notes (Signed)
Physical Therapy Session Note  Patient Details  Name: Nathan Hart MRN: [id number] Date of Birth: 1956/12/07  Today's Date: 08/13/2020 PT Individual Time: 2751-7001 PT Individual Time Calculation (min): 30 min   Short Term Goals: Week 1:  PT Short Term Goal 1 (Week 1): STGs=LTGs Week 2:    Week 3:     Skilled Therapeutic Interventions/Progress Updates:     PAIN Denies pain  Pt seen for 30 min session, agreeable to treatment.  Supine to sit w/cga, scoots fully to edge w/cues. STS w/cga but leaning to R requiring min to mod assist initially in standing. Returned to sitting, pt w/mild R lean, corrects w/cues. When asked if he felt dizzy, pt denies.  Also denies feeling "ill".  "I just dont feel good."  "I want to go home".  STS w/cga to RW and cues for attention to R hand positioing on walker.   Gait 124ft w/RW and min assist, frequent cues to attend to RLE positioning within walker, attention to R environment, overall upright posture. Turn/sit to chair for rest w/cues, min assist to manage R side of walker.  Pt rested several min then STS from chair as above.  Gait 65ft as described above before pt suddenly slumped to R and remained awake but not responding to therapist/dependent assist of 2 to transfer to chair, placed in recline. BP 126/64, HR 65, pt returning to baseline level of alertness/responsiveness.  After several min performed SPT to wc w/min assist and cues.  Transported to room and SPT wc to bed w/min to mod assist of 1.  Sit to supine w/min assist. In supine pt attempted mult efforts at communicating needs repeating "Can I get..." mult times but unable to complete thought.   Therapist eventually able to determine pt wanted tv turned on.  Pt left supine w/rails up x 4, alarm set, bed in lowest position, and needs in reach.  MD notified of event.  Therapy Documentation Precautions:  Precautions Precautions: Fall Precaution Comments: legally blind Restrictions Weight  Bearing Restrictions: No    Therapy/Group: Individual Therapy  Callie Fielding, [place] 08/13/2020, 12:26 PM

## 2020-08-13 NOTE — Progress Notes (Signed)
[place] PHYSICAL MEDICINE & REHABILITATION PROGRESS NOTE  Subjective/Complaints: Patient seen laying in bed this morning.  He states he slept fairly overnight.  He denies complaints.  ROS: Denies CP, SOB, N/V/D  Objective: Vital Signs: Blood pressure 125/72, pulse 78, temperature 98 F (36.7 C), resp. rate 18, SpO2 96 %. No results found. Recent Labs    08/13/20 0840  WBC 5.8  HGB 7.6*  HCT 27.1*  PLT 228   No results for input(s): NA, K, CL, CO2, GLUCOSE, BUN, CREATININE, CALCIUM in the last 72 hours.  Physical Exam: BP 125/72   Pulse 78   Temp 98 F (36.7 C)   Resp 18   SpO2 96%  Constitutional: No distress . Vital signs reviewed. HENT: Normocephalic.  Atraumatic. Eyes: EOMI. No discharge. Cardiovascular: No JVD. Respiratory: Normal effort.  No stridor. GI: Non-distended. Skin: Warm and dry.  Intact. Psych: Flat. Musc: No edema in extremities.  No tenderness in extremities. Neuro: Alert Motor:  LUE: 5/5 proximal distal, stable RUE: 4+/5 shoulder abduction, elbow flexion/extension, handgrip, finger extension 0/5, unchanged LLE: 5/5 proximal distal RLE: 4+/5 proximal and distal, stable  Assessment/Plan: 1. Functional deficits secondary to left posterior MCA/PCA infarcts which require 3+ hours per day of interdisciplinary therapy in a comprehensive inpatient rehab setting.  Physiatrist is providing close team supervision and 24 hour management of active medical problems listed below.  Physiatrist and rehab team continue to assess barriers to discharge/monitor patient progress toward functional and medical goals  Care Tool:  Bathing    Body parts bathed by patient: Right arm, Chest, Abdomen, Front perineal area, Right upper leg, Left upper leg, Right lower leg, Left lower leg, Face   Body parts bathed by helper: Left arm Body parts n/a: Buttocks   Bathing assist Assist Level: Minimal Assistance - Patient > 75%     Upper Body Dressing/Undressing Upper  body dressing Upper body dressing/undressing activity did not occur (including orthotics): N/A What is the patient wearing?: Pull over shirt    Upper body assist Assist Level: Supervision/Verbal cueing    Lower Body Dressing/Undressing Lower body dressing    Lower body dressing activity did not occur: N/A What is the patient wearing?: Pants     Lower body assist Assist for lower body dressing: Contact Guard/Touching assist     Toileting Toileting Toileting Activity did not occur (Clothing management and hygiene only): N/A (no void or bm)  Toileting assist Assist for toileting: Minimal Assistance - Patient > 75% (urinal placement)     Transfers Chair/bed transfer  Transfers assist     Chair/bed transfer assist level: Minimal Assistance - Patient > 75%     Locomotion Ambulation   Ambulation assist      Assist level: Contact Guard/Touching assist Assistive device: No Device Max distance: 65 ft   Walk 10 feet activity   Assist     Assist level: Contact Guard/Touching assist Assistive device: No Device   Walk 50 feet activity   Assist    Assist level: Contact Guard/Touching assist Assistive device: No Device    Walk 150 feet activity   Assist Walk 150 feet activity did not occur: Safety/medical concerns (fatigue)         Walk 10 feet on uneven surface  activity   Assist     Assist level: Minimal Assistance - Patient > 75% Assistive device: Hand held assist   Wheelchair     Assist Will patient use wheelchair at discharge?: No  Wheelchair 50 feet with 2 turns activity    Assist            Wheelchair 150 feet activity     Assist           BP 125/72   Pulse 78   Temp 98 F (36.7 C)   Resp 18   SpO2 96%   Medical Problem List and Plan: 1.  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  Continue CIR 2.  Antithrombotics: -DVT/anticoagulation: Lovenox             -antiplatelet therapy: Aspirin 325 mg daily and Plavix 75 mg daily x3 months then aspirin alone 3. Pain Management: Tylenol as needed.  4. Mood/dementia: Provide emotional support             -antipsychotic agents: N/A  -team providing ego support on daily basis 5. Neuropsych/dementia: This patient is  not capable of making decisions on his own behalf. 6. Skin/Wound Care: Routine skin checks 7. Fluids/Electrolytes/Nutrition: Routine in and outs. 8.  Hypertension with orthostasis.    Increased coreg to 6.25mg  BID  Relatively controlled on 8/20 9.  COPD.  Oxygen dependent.  Check oxygen saturations every shift  No breathing issues on 8/20 10.  Legally blind bilaterally.  Patient has assistance of son at home- due to macular degeneration 11.  History of tobacco and alcohol use.  Provide counseling 12.  Hyperlipidemia.    Continue Lipitor 13.  Questionable medical compliance.  Provide counseling with patient and family. 14.?  Acute blood loss anemia  Hemoglobin 7.6 on 8/20-severe drop, repeat labs ordered  Labs ordered for Monday 17. Weak RUE finger extensors- continue NMES  LOS: 8 days A FACE TO FACE EVALUATION WAS PERFORMED  Ronda Rajkumar Lorie [place]ix 08/13/2020, 10:19 AM

## 2020-08-13 NOTE — Progress Notes (Addendum)
Patient ID: Nathan Hart, male   DOB: 24-Feb-1956, 64 y.o.   MRN: [id number] Set up a follow up appointment for a PCP with the [facility] with  Dr. Aundra Dubin [address] [address] for  September 28, 2020 @ 2:30 p.m. Checkin at 2:15 p.m. for new patient registration completion. The patient was also placed on a cancellation list for an appointment sooner if one becomes available and the [facility] will contact the patient to let them know of the option to change. [name]

## 2020-08-14 ENCOUNTER — Encounter (HOSPITAL_COMMUNITY): Payer: Self-pay | Admitting: Physical Medicine & Rehabilitation

## 2020-08-14 ENCOUNTER — Inpatient Hospital Stay (HOSPITAL_COMMUNITY): Payer: Medicare Other | Admitting: Physical Therapy

## 2020-08-14 ENCOUNTER — Inpatient Hospital Stay (HOSPITAL_COMMUNITY): Payer: Medicare Other

## 2020-08-14 ENCOUNTER — Inpatient Hospital Stay (HOSPITAL_COMMUNITY): Payer: Medicare Other | Admitting: Occupational Therapy

## 2020-08-14 DIAGNOSIS — G459 Transient cerebral ischemic attack, unspecified: Secondary | ICD-10-CM

## 2020-08-14 LAB — BASIC METABOLIC PANEL
Anion gap: 13 (ref 5–15)
BUN: 14 mg/dL (ref 8–23)
CO2: 22 mmol/L (ref 22–32)
Calcium: 8.9 mg/dL (ref 8.9–10.3)
Chloride: 104 mmol/L (ref 98–111)
Creatinine, Ser: 0.71 mg/dL (ref 0.61–1.24)
GFR calc Af Amer: >60 mL/min (ref >60)
GFR calc non Af Amer: >60 mL/min (ref >60)
Glucose, Bld: 153 mg/dL — ABNORMAL HIGH (ref 70–99)
Potassium: 3.7 mmol/L (ref 3.5–5.1)
Sodium: 139 mmol/L (ref 135–145)

## 2020-08-14 LAB — CBC
HCT: 27.6 % — ABNORMAL LOW (ref 39.0–52.0)
Hemoglobin: 8 g/dL — ABNORMAL LOW (ref 13.0–17.0)
MCH: 21.6 pg — ABNORMAL LOW (ref 26.0–34.0)
MCHC: 29 g/dL — ABNORMAL LOW (ref 30.0–36.0)
MCV: 74.4 fL — ABNORMAL LOW (ref 80.0–100.0)
Platelets: 259 10*3/uL (ref 150–400)
RBC: 3.71 MIL/uL — ABNORMAL LOW (ref 4.22–5.81)
RDW: 19.8 % — ABNORMAL HIGH (ref 11.5–15.5)
WBC: 7 10*3/uL (ref 4.0–10.5)
nRBC: 0 % (ref 0.0–0.2)

## 2020-08-14 MED ORDER — ALBUMIN HUMAN 5 % IV SOLN
12.5000 g | Freq: Four times a day (QID) | INTRAVENOUS | Status: DC
  Administered 2020-08-14 (×2): 12.5 g via INTRAVENOUS
  Filled 2020-08-14 (×4): qty 250

## 2020-08-14 MED ORDER — IOHEXOL 350 MG/ML SOLN
50.0000 mL | Freq: Once | INTRAVENOUS | Status: AC | PRN
  Administered 2020-08-14: 50 mL via INTRAVENOUS

## 2020-08-14 MED ORDER — SODIUM CHLORIDE 0.9 % IV SOLN: INTRAVENOUS | Status: DC

## 2020-08-14 MED ORDER — SODIUM CHLORIDE 0.45 % IV SOLN
INTRAVENOUS | Status: DC
  Filled 2020-08-14 (×5): qty 500

## 2020-08-14 NOTE — Progress Notes (Signed)
Physical Therapy Session Note  Patient Details  Name: Nathan Hart MRN: [id number] Date of Birth: [birth year]  Today's Date: 08/14/2020 PT Individual Time: 3335-4562 PT Individual Time Calculation (min): 34 min     Short Term Goals: Week 1:  PT Short Term Goal 1 (Week 1): STGs=LTGs  Skilled Therapeutic Interventions/Progress Updates:  Pt received supine in bed, awake and agreeable to therapy session stating "yes." Therapist donned B LE thigh high TEDs and pt able to lift each leg to assist with this task. Assessed supine vitals: BP 124/63 (MAP 81), HR 74bpm with HOB elevated at 50degrees. Supine>sit L EOB, with HOB elevated and using bedrail - pt demos R hemibody inattention leaving R arm behind him and not bringing R leg towards EOB despite cuing, requiring min assist for R hemibody management and light mod assist to scoot hips towards EOB - also demoing some motor planning impairments. Reassessed vitals: BP 96/56 (MAP 70), HR 81bpm.  Pt not responding verbally to therapist questions despite max cuing but could follow simple, 1 step commands with L hemibody but showing R inattention to body and space with L gaze preference. Supine>sit with mod assist for B LE management into bed and trunk decent. Notified Santiago Glad, RN who was present to further assess patient and notified of pt's decreased verbal response, appearing worsening R hemibody strength, and R inattention. Reassessed vitals in supine: BP 124/68 (MAP 84), HR 73bpm then again a few minutes later BP 109/62 (MAP 76), HR 76bpm. RN notified Dr. Naaman Plummer and he was present to assess pt - made MD aware of events of therapy session and pt left supine in bed in the care of MD and RN.  Therapy Documentation Precautions:  Precautions Precautions: Fall Precaution Comments: legally blind Restrictions Weight Bearing Restrictions: No  Therapy/Group: Individual Therapy  Tawana Scale , PT, DPT, CSRS  08/14/2020, 7:58 AM

## 2020-08-14 NOTE — Progress Notes (Signed)
Occupational Therapy Note  Patient Details  Name: Nathan Hart MRN: [id number] Date of Birth: 09/08/1956  Today's Date: 08/14/2020 OT Missed Time: 18 Minutes Missed Time Reason: MD hold (comment)  Pt on hold currently; on bed rest.    Willeen Cass [facility] 08/14/2020, 3:07 PM

## 2020-08-14 NOTE — Progress Notes (Signed)
Physical Therapy Session Note  Patient Details  Name: HANDY MCLOUD MRN: [id number] Date of Birth: 01-May-1956  Today's Date: 08/14/2020 PT Missed Time: 71 Minutes Missed Time Reason: MD hold (Comment)  Pt currently on bed rest per MD note. Missed 30 minutes of skilled physical therapy.  Tawana Scale , PT, DPT, CSRS  08/14/2020, 3:41 PM

## 2020-08-14 NOTE — Progress Notes (Signed)
[place] PHYSICAL MEDICINE & REHABILITATION PROGRESS NOTE  Subjective/Complaints: Pt up in bed when I saw him earlier this morning. Was moving all 4, speaking. RN called me minutes ago. Pt with sudden weakness in right arm and leg, no longer speaking.   ROS: Limited due to cognitive/behavioral   Objective: Vital Signs: Blood pressure 118/60, pulse 73, temperature 98.4 F (36.9 C), temperature source Oral, resp. rate 19, SpO2 97 %. No results found. Recent Labs    08/13/20 0840 08/13/20 1237  WBC 5.8 7.7  HGB 7.6* 8.1*  HCT 27.1* 29.0*  PLT 228 278   No results for input(s): NA, K, CL, CO2, GLUCOSE, BUN, CREATININE, CALCIUM in the last 72 hours.  Physical Exam: BP 118/60 (BP Location: Right Arm)   Pulse 73   Temp 98.4 F (36.9 C) (Oral)   Resp 19   SpO2 97%  Constitutional: No distress . Vital signs reviewed. HENT: Normocephalic.  Atraumatic. Eyes: EOMI. No discharge. Cardiovascular: No JVD. Respiratory: Normal effort.  No stridor. GI: Non-distended. Skin: Warm and dry.  Intact. Psych: Flat. Musc: No edema in extremities.  No tenderness in extremities. Neuro: Alert, aphasic. No longer able to express at all. Followed some basic commands.  Motor:  LUE: 4-5/5 proximal distal, stable RUE: 0/5 shoulder abduction, elbow flexion/extension, handgrip, finger extension 0/5,  LLE: 4-5/5 proximal distal RLE: tr to 0/5 proximal and distal, stable  Assessment/Plan: 1. Functional deficits secondary to left posterior MCA/PCA infarcts which require 3+ hours per day of interdisciplinary therapy in a comprehensive inpatient rehab setting.  Physiatrist is providing close team supervision and 24 hour management of active medical problems listed below.  Physiatrist and rehab team continue to assess barriers to discharge/monitor patient progress toward functional and medical goals  Care Tool:  Bathing    Body parts bathed by patient: Right arm, Chest, Abdomen, Front perineal area,  Face   Body parts bathed by helper: Left arm, Buttocks, Right upper leg, Left upper leg, Right lower leg, Left lower leg Body parts n/a: Buttocks   Bathing assist Assist Level: Moderate Assistance - Patient 50 - 74%     Upper Body Dressing/Undressing Upper body dressing Upper body dressing/undressing activity did not occur (including orthotics): N/A What is the patient wearing?: Pull over shirt    Upper body assist Assist Level: Minimal Assistance - Patient > 75%    Lower Body Dressing/Undressing Lower body dressing    Lower body dressing activity did not occur: N/A What is the patient wearing?: Pants     Lower body assist Assist for lower body dressing: Moderate Assistance - Patient 50 - 74%     Toileting Toileting Toileting Activity did not occur Landscape architect and hygiene only): N/A (no void or bm)  Toileting assist Assist for toileting: Minimal Assistance - Patient > 75% (urinal placement)     Transfers Chair/bed transfer  Transfers assist     Chair/bed transfer assist level: Minimal Assistance - Patient > 75%     Locomotion Ambulation   Ambulation assist      Assist level: Minimal Assistance - Patient > 75% Assistive device: Walker-rolling Max distance: 110   Walk 10 feet activity   Assist     Assist level: Minimal Assistance - Patient > 75% Assistive device: Walker-rolling   Walk 50 feet activity   Assist    Assist level: Minimal Assistance - Patient > 75% Assistive device: Walker-rolling    Walk 150 feet activity   Assist Walk 150 feet activity did not  occur: Safety/medical concerns         Walk 10 feet on uneven surface  activity   Assist     Assist level: Minimal Assistance - Patient > 75% Assistive device: Hand held assist   Wheelchair     Assist Will patient use wheelchair at discharge?: No             Wheelchair 50 feet with 2 turns activity    Assist            Wheelchair 150 feet  activity     Assist           BP 118/60 (BP Location: Right Arm)   Pulse 73   Temp 98.4 F (36.9 C) (Oral)   Resp 19   SpO2 97%   Medical Problem List and Plan: 1.  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  -with acute change in status, I spoke with Dr. Erlinda Hong, Code stroke called.   -initial orders/care in progress  -further mgt per neuro team.  2.  Antithrombotics: -DVT/anticoagulation: Lovenox             -antiplatelet therapy: Aspirin 325 mg daily and Plavix 75 mg daily x3 months then aspirin alone 3. Pain Management: Tylenol as needed.  4. Mood/dementia: Provide emotional support             -antipsychotic agents: N/A  -team providing ego support  5. Neuropsych/dementia: This patient is  not capable of making decisions on his own behalf. 6. Skin/Wound Care: Routine skin checks 7. Fluids/Electrolytes/Nutrition: Routine in and outs. 8.  Hypertension with orthostasis.     coreg  6.25mg  BID    9.  COPD.  Oxygen dependent.  Check oxygen saturations every shift  No breathing issues currently 10.  Legally blind bilaterally.  Patient has assistance of son at home- due to macular degeneration 11.  History of tobacco and alcohol use.  Provide counseling 12.  Hyperlipidemia.    Continue Lipitor 13.  Questionable medical compliance.  Provide counseling with patient and family. 14.?  Acute blood loss anemia  Hemoglobin 7.6 on 8/20-severe drop, repeat labs ordered  Labs have been re-ordered for today   LOS: 9 days A FACE TO [place] 08/14/2020, 11:07 AM

## 2020-08-14 NOTE — Consult Note (Signed)
Stroke Neurology Consultation Note  Consult Requested by: Dr. Naaman Plummer  Reason for Consult: stroke  Consult Date: 08/14/20   The history was obtained from the Dr. Naaman Plummer and RN.  During history and examination, all items were able to obtain unless otherwise noted.  History of Present Illness:  Nathan Hart is a 64 y.o. 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  As per Dr. Naaman Plummer and RN, patient has been stable since admission to [facility].  However, for the last 2 days, patient had orthostasis so he was not able to work with PT/OT out of bed. This morning, his RN talked with him at 8:30am while he was eating breakfast, he was at his baseline, talking normally without aphasia or dysarthria. PT/OT was going to work with him at 11am and found him not able to speak, garbled speech. RN then found him not able to move right arm and leg. Dr. Naaman Plummer notified and code stroke called.   Patient had recent admission 2 weeks ago 08/02/2020 for left MCA and PCA infarcts due to left CCA occlusion.  He was also found to have orthostatic hypotension.  He was put on aspirin 325 and the Plavix 75 DAPT and statin for stroke prevention.  He was also put on TED hose for orthostatic hypotension.  He was discharged to [facility] on 08/05/2020.  He was continued on Coreg.  His BP this morning 118/60.  On arrival, patient still has global aphasia, not able to follow peripheral commands but able to follow limited midline commands.  Right upper and lower extremity barely against gravity.  Put him on bedrest with Trendelenburg position.  IV bolus.  Patient symptoms started to improving.  CT no acute abnormality.  CT head and neck continued left CCA occlusion, no acute vascular change.  However, new findings of apical left  lung worrisome for malignancy.  LSN: 8:30 AM tPA Given: No: Recent stroke, symptoms improving  Past Medical History:  Diagnosis Date   Abnormal peripheral vision of left eye    Blind right eye    COPD (chronic obstructive pulmonary disease) (HCC)     home o2 hs and prn   Dyspnea    with activity   Empyema lung (HCC)    ETOH abuse    HTN (hypertension)    Peripheral vascular disease ([place])    Pneumonia 2020   Stroke [facility])     Past Surgical History:  Procedure Laterality Date   BACK SURGERY     disc lumar x 2    CLAVICLE SURGERY Right    IR ANGIO INTRA EXTRACRAN SEL COM CAROTID INNOMINATE BILAT MOD SED  09/12/2019   IR ANGIO INTRA EXTRACRAN SEL COM CAROTID INNOMINATE BILAT MOD SED  08/03/2020   IR ANGIO VERTEBRAL SEL SUBCLAVIAN INNOMINATE UNI L MOD SED  09/12/2019   IR ANGIO VERTEBRAL SEL VERTEBRAL UNI R MOD SED  09/12/2019   IR ANGIO VERTEBRAL SEL VERTEBRAL UNI R MOD SED  08/03/2020   IR ANGIOGRAM EXTREMITY LEFT  08/03/2020   IR INTRAVSC STENT CERV CAROTID W/EMB-PROT MOD SED INCL ANGIO  09/24/2019   IR US GUIDE VASC ACCESS RIGHT  09/12/2019   IR US GUIDE VASC ACCESS RIGHT  08/03/2020   KNEE SURGERY Right     x 3 ACL   RADIOLOGY WITH ANESTHESIA  N/A 09/24/2019   Procedure: IR WITH ANESTHESIA/STENTING;  Surgeon: Luanne Bras, MD;  Location: [place];  Service: Radiology;  Laterality: N/A;   [place], [place]    Family History  Problem Relation Age of Onset   Cancer Mother     Social History:  reports that he has been smoking cigarettes. He has a 13.50 pack-year smoking history. He has never used smokeless tobacco. He reports current alcohol use of about 20.0 standard drinks of alcohol per week. He reports that he does not use drugs.  Allergies: No Known Allergies  No current facility-administered medications on file prior to encounter.   Current Outpatient Medications on File Prior to Encounter  Medication Sig Dispense Refill    acetaminophen (TYLENOL) 325 MG tablet Take 650 mg by mouth every 6 (six) hours as needed for moderate pain or headache.     albuterol (PROVENTIL) (2.5 MG/3ML) 0.083% nebulizer solution Take 3 mLs (2.5 mg total) by nebulization every 6 (six) hours as needed for wheezing or shortness of breath. For breathing 75 mL 12   aspirin EC 325 MG EC tablet Take 1 tablet (325 mg total) by mouth daily. 30 tablet 0   atorvastatin (LIPITOR) 40 MG tablet Take 1 tablet (40 mg total) by mouth every evening. For Cholesterol 30 tablet 5   carvedilol (COREG) 3.125 MG tablet Take 1 tablet (3.125 mg total) by mouth 2 (two) times daily with a meal.     clopidogrel (PLAVIX) 75 MG tablet Take 1 tablet (75 mg total) by mouth daily.     diphenhydrAMINE HCl, Sleep, (ZZZQUIL) 25 MG CAPS Take 25 mg by mouth at bedtime as needed (sleep).     folic acid (FOLVITE) 1 MG tablet Take 1 tablet (1 mg total) by mouth daily. 30 tablet 5   Multiple Vitamin (MULTIVITAMIN WITH MINERALS) TABS tablet Take 1 tablet by mouth daily.     senna-docusate (SENOKOT-S) 8.6-50 MG tablet Take 1 tablet by mouth at bedtime as needed for moderate constipation.     thiamine 100 MG tablet Take 1 tablet (100 mg total) by mouth daily.      Review of Systems: A full ROS was attempted today and was able to be performed.    Physical Examination: Temp:  [97.3 F (36.3 C)-98.4 F (36.9 C)] 97.4 F (36.3 C) (08/21 1325) Pulse Rate:  [71-77] 71 (08/21 1325) Resp:  [16-20] 20 (08/21 1325) BP: (118-155)/(60-78) 155/69 (08/21 1325) SpO2:  [95 %-99 %] 98 % (08/21 1325)  General - well nourished, well developed, in no apparent distress.    Ophthalmologic - fundi not visualized due to noncooperation.    Cardiovascular - regular rhythm and rate  Neuro - awake alert, eyes open, difficult answer orientation questions due to aphasia.  Follow limited midline commands but no peripheral commands.  PERRL, EOMI.  Chronic legally blindness, not consistent  blinking to visual threat.  No significant facial droop.  Tongue midline.  Left upper and lower extremity 5/5.  Right upper extremity 3-/5, right lower extremity 3/5. Sensation, coordination not cooperative due to aphasia and gait not tested.   Data Reviewed: CT ANGIO HEAD W OR WO CONTRAST  Result Date: 08/14/2020 CLINICAL DATA:  Code stroke with right-sided weakness. EXAM: CT ANGIOGRAPHY HEAD AND NECK TECHNIQUE: Multidetector CT imaging of the head and neck was performed using the standard protocol during bolus administration of intravenous contrast. Multiplanar CT image reconstructions and MIPs were obtained to evaluate the vascular anatomy. Carotid stenosis  measurements (when applicable) are obtained utilizing NASCET criteria, using the distal internal carotid diameter as the denominator. CONTRAST:  75mL OMNIPAQUE IOHEXOL 350 MG/ML SOLN COMPARISON:  08/02/2020 FINDINGS: CTA NECK FINDINGS Aortic arch: Atheromatous plaque. Right carotid system: Atheromatous wall thickening diffusely in the common carotid and ICA. There is a stent spanning the bifurcation which is patent. No downstream beading or dissection Left carotid system: Chronic occlusion beginning just after the common carotid origin with irregular calcified plaque. There is reconstitution at the bifurcation with narrowing less intensely enhancing left ICA that is patent into the skull. Vertebral arteries: Atheromatous plaque in the bilateral subclavian artery. Dominant right vertebral artery which is widely patent to the dura. No flow seen at the left V1 segment, but there is reconstitution and flow seen by the basilar. Skeleton: Advanced cervical spine degeneration. No acute or aggressive finding. Other neck: No acute finding. Upper chest: Emphysema. Scattered pulmonary nodules, most notably at nodule measuring 15 mm at the left apex which appears to invade the adjacent pleura. Review of the MIP images confirms the above findings CTA HEAD FINDINGS  Anterior circulation: Heavily calcified bilateral carotid siphons and petrous segments. The segments are recently evaluated by catheter angiography which is better due to the degree of artifact from skull base and calcified plaque. No emergent branch occlusion or aneurysm. Posterior circulation: Right vertebral artery dominance. Left V4 segment calcified plaque. Basilar artery is widely patent. Fetal type left PCA flow, which may be acquired. Venous sinuses: Diffusely patent Anatomic variants: As above Review of the MIP images confirms the above findings These results were communicated to Dr Erlinda Hong at 12:47 pmon 8/21/2021by text page via the [facility] messaging system. IMPRESSION: 1. No emergent vascular finding. No change from CTA and catheter angiogram earlier this month. 2. 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 3. Diffusely patent right cervical carotid stent. 4. Pulmonary nodules in the apical lungs including a 15 mm left apical subpleural nodule which appears to invade the pleura, primarily worrisome for malignancy/metastatic disease. Recommend dedicated chest CT. Electronically Signed   By: Monte Fantasia M.D.   On: 08/14/2020 12:49   CT Code Stroke CTA Head W/WO contrast  Result Date: 08/02/2020 CLINICAL DATA:  Neuro deficit EXAM: CT ANGIOGRAPHY HEAD AND NECK TECHNIQUE: Multidetector CT imaging of the head and neck was performed using the standard protocol during bolus administration of intravenous contrast. Multiplanar CT image reconstructions and MIPs were obtained to evaluate the vascular anatomy. Carotid stenosis measurements (when applicable) are obtained utilizing NASCET criteria, using the distal internal carotid diameter as the denominator. CONTRAST:  131mL OMNIPAQUE IOHEXOL 350 MG/ML SOLN COMPARISON:  Same day noncontrast head CT and CT cerebral perfusion. 09/08/2019 CT neck. FINDINGS: CTA NECK FINDINGS Aortic arch: Standard branching. Imaged portion shows no  evidence of aneurysm or dissection. No significant stenosis of the major arch vessel origins. Calcified atheromatous plaque. Right carotid system: No evidence of dissection, stenosis (50% or greater) or occlusion. Calcified atheromatous plaque at the bifurcation. Patent right stent spanning the distal common/proximal internal carotid arteries. Left carotid system: The proximal common left carotid artery demonstrates complete occlusion shortly after its takeoff to the level of the bifurcation where there is extensive calcified and noncalcified atheromatous plaque. There is a distal reconstitution of the left internal carotid and external carotid arteries. The left internal carotid artery demonstrates comparatively diminished opacification. Vertebral arteries: Dominant right vertebral artery. Diminutive left vertebral artery with V4 segment atherosclerotic calcifications. No definite focal high-grade narrowing however diffuse  vessel wall irregularity likely reflects atheromatous disease. No evidence of dissection, stenosis (50% or greater) or occlusion. Skeleton: Multilevel spondylosis with reversal of cervical lordosis. Grade 1 C2-3 and C7-T1 anterolisthesis. Grade 1 C5-6 and C6-7 retrolisthesis. No acute osseous abnormality. Other neck: No adenopathy.  No soft tissue mass. Upper chest: Emphysema.  Biapical atelectasis. Review of the MIP images confirms the above findings CTA HEAD FINDINGS Anterior circulation: Asymmetrically diminished opacification of the left internal carotid artery. Bilateral carotid siphon atherosclerotic calcifications, right greater than left. Distal left ICA cervical segment and petrous segment atherosclerotic calcifications. Diminutive left M1 segment. No aneurysm or vascular malformation. Posterior circulation: Dominant right vertebral artery. Dominant right PCA. Fetal origin of the left PCA. Mild-to-moderate irregularity involving the left P2 segment likely secondary to atheromatous  disease. No aneurysm, or vascular malformation. Asymmetrically diminished opacification of the left parietooccipital cortical branches. Venous sinuses: No evidence of thrombosis. Anatomic variants: Bilateral PCOM hypoplasia. Review of the MIP images confirms the above findings IMPRESSION: 1. 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 Distal reconstitution of the left internal and external carotid arteries. 2. Asymmetrically diminished opacification of the left internal carotid artery to the terminus. 3. Mild to moderate narrowing and irregularity involving the left P2 segment, likely secondary to atheromatous disease. 4. Asymmetrically diminished opacification of left parietooccipital cortical branches. 5. Patent right common/proximal internal carotid artery stent. 6. Diminutive left vertebral artery with V4 segment atherosclerotic calcifications. These results were called by telephone at the time of interpretation on 08/02/2020 at 2:46 pm to provider Dr. Erlinda Hong, Who verbally acknowledged these results. Electronically Signed   By: Primitivo Gauze M.D.   On: 08/02/2020 14:58   CT ANGIO NECK W OR WO CONTRAST  Result Date: 08/14/2020 CLINICAL DATA:  Code stroke with right-sided weakness. EXAM: CT ANGIOGRAPHY HEAD AND NECK TECHNIQUE: Multidetector CT imaging of the head and neck was performed using the standard protocol during bolus administration of intravenous contrast. Multiplanar CT image reconstructions and MIPs were obtained to evaluate the vascular anatomy. Carotid stenosis measurements (when applicable) are obtained utilizing NASCET criteria, using the distal internal carotid diameter as the denominator. CONTRAST:  67mL OMNIPAQUE IOHEXOL 350 MG/ML SOLN COMPARISON:  08/02/2020 FINDINGS: CTA NECK FINDINGS Aortic arch: Atheromatous plaque. Right carotid system: Atheromatous wall thickening diffusely in the common carotid and ICA. There is a  stent spanning the bifurcation which is patent. No downstream beading or dissection Left carotid system: Chronic occlusion beginning just after the common carotid origin with irregular calcified plaque. There is reconstitution at the bifurcation with narrowing less intensely enhancing left ICA that is patent into the skull. Vertebral arteries: Atheromatous plaque in the bilateral subclavian artery. Dominant right vertebral artery which is widely patent to the dura. No flow seen at the left V1 segment, but there is reconstitution and flow seen by the basilar. Skeleton: Advanced cervical spine degeneration. No acute or aggressive finding. Other neck: No acute finding. Upper chest: Emphysema. Scattered pulmonary nodules, most notably at nodule measuring 15 mm at the left apex which appears to invade the adjacent pleura. Review of the MIP images confirms the above findings CTA HEAD FINDINGS Anterior circulation: Heavily calcified bilateral carotid siphons and petrous segments. The segments are recently evaluated by catheter angiography which is better due to the degree of artifact from skull base and calcified plaque. No emergent branch occlusion or aneurysm. Posterior circulation: Right vertebral artery dominance. Left V4 segment calcified plaque. Basilar artery is  widely patent. Fetal type left PCA flow, which may be acquired. Venous sinuses: Diffusely patent Anatomic variants: As above Review of the MIP images confirms the above findings These results were communicated to Dr Erlinda Hong at 12:47 pmon 8/21/2021by text page via the [facility] messaging system. IMPRESSION: 1. No emergent vascular finding. No change from CTA and catheter angiogram earlier this month. 2. 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 3. Diffusely patent right cervical carotid stent. 4. Pulmonary nodules in the apical lungs including a 15 mm left apical subpleural nodule which appears to invade the pleura, primarily  worrisome for malignancy/metastatic disease. Recommend dedicated chest CT. Electronically Signed   By: Monte Fantasia M.D.   On: 08/14/2020 12:49   CT Code Stroke CTA Neck W/WO contrast  Result Date: 08/02/2020 CLINICAL DATA:  Neuro deficit EXAM: CT ANGIOGRAPHY HEAD AND NECK TECHNIQUE: Multidetector CT imaging of the head and neck was performed using the standard protocol during bolus administration of intravenous contrast. Multiplanar CT image reconstructions and MIPs were obtained to evaluate the vascular anatomy. Carotid stenosis measurements (when applicable) are obtained utilizing NASCET criteria, using the distal internal carotid diameter as the denominator. CONTRAST:  115mL OMNIPAQUE IOHEXOL 350 MG/ML SOLN COMPARISON:  Same day noncontrast head CT and CT cerebral perfusion. 09/08/2019 CT neck. FINDINGS: CTA NECK FINDINGS Aortic arch: Standard branching. Imaged portion shows no evidence of aneurysm or dissection. No significant stenosis of the major arch vessel origins. Calcified atheromatous plaque. Right carotid system: No evidence of dissection, stenosis (50% or greater) or occlusion. Calcified atheromatous plaque at the bifurcation. Patent right stent spanning the distal common/proximal internal carotid arteries. Left carotid system: The proximal common left carotid artery demonstrates complete occlusion shortly after its takeoff to the level of the bifurcation where there is extensive calcified and noncalcified atheromatous plaque. There is a distal reconstitution of the left internal carotid and external carotid arteries. The left internal carotid artery demonstrates comparatively diminished opacification. Vertebral arteries: Dominant right vertebral artery. Diminutive left vertebral artery with V4 segment atherosclerotic calcifications. No definite focal high-grade narrowing however diffuse vessel wall irregularity likely reflects atheromatous disease. No evidence of dissection, stenosis (50% or  greater) or occlusion. Skeleton: Multilevel spondylosis with reversal of cervical lordosis. Grade 1 C2-3 and C7-T1 anterolisthesis. Grade 1 C5-6 and C6-7 retrolisthesis. No acute osseous abnormality. Other neck: No adenopathy.  No soft tissue mass. Upper chest: Emphysema.  Biapical atelectasis. Review of the MIP images confirms the above findings CTA HEAD FINDINGS Anterior circulation: Asymmetrically diminished opacification of the left internal carotid artery. Bilateral carotid siphon atherosclerotic calcifications, right greater than left. Distal left ICA cervical segment and petrous segment atherosclerotic calcifications. Diminutive left M1 segment. No aneurysm or vascular malformation. Posterior circulation: Dominant right vertebral artery. Dominant right PCA. Fetal origin of the left PCA. Mild-to-moderate irregularity involving the left P2 segment likely secondary to atheromatous disease. No aneurysm, or vascular malformation. Asymmetrically diminished opacification of the left parietooccipital cortical branches. Venous sinuses: No evidence of thrombosis. Anatomic variants: Bilateral PCOM hypoplasia. Review of the MIP images confirms the above findings IMPRESSION: 1. 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 Distal reconstitution of the left internal and external carotid arteries. 2. Asymmetrically diminished opacification of the left internal carotid artery to the terminus. 3. Mild to moderate narrowing and irregularity involving the left P2 segment, likely secondary to atheromatous disease. 4. Asymmetrically diminished opacification of left parietooccipital cortical branches. 5. Patent right common/proximal  internal carotid artery stent. 6. Diminutive left vertebral artery with V4 segment atherosclerotic calcifications. These results were called by telephone at the time of interpretation on 08/02/2020 at 2:46 pm to provider Dr. Erlinda Hong, Who  verbally acknowledged these results. Electronically Signed   By: Primitivo Gauze M.D.   On: 08/02/2020 14:58   MR BRAIN WO CONTRAST  Result Date: 08/02/2020 CLINICAL DATA:  Stroke, follow-up EXAM: MRI HEAD WITHOUT CONTRAST TECHNIQUE: Multiplanar, multiecho pulse sequences of the brain and surrounding structures were obtained without intravenous contrast. COMPARISON:  Correlation made with prior CT imaging FINDINGS: Brain: There is reduced diffusion in the left occipital lobe extending into the temporal and parietal lobes. There is also patchy reduced diffusion in the left frontal lobe. A small focus is present in the posterior left thalamus. No significant mass effect. There is minimal susceptibility hypointensity primarily in the left parietooccipital lobes likely reflecting petechial hemorrhage. Chronic infarct centered within the right parietal lobe. Additional chronic infarct of the left basal ganglia and adjacent white matter. Superimposed patchy and confluent areas of T2 hyperintensity in the supratentorial and pontine white matter are nonspecific but probably reflect moderate chronic microvascular ischemic changes. Prominence of the ventricles and sulci reflects generalized parenchymal volume loss. Vascular: Major vessel flow voids at the skull base are preserved. Skull and upper cervical spine: Normal marrow signal is preserved. Sinuses/Orbits: Paranasal sinuses are aerated. Orbits are unremarkable. Other: Sella is unremarkable.  Mastoid air cells are clear. IMPRESSION: Acute infarcts involving left posterior greater than anterior circulations, noting presence of a fetal left PCA on the prior CTA. No significant mass effect. There is minor petechial hemorrhage. Moderate chronic microvascular ischemic changes.  Chronic infarcts. Electronically Signed   By: Macy Mis M.D.   On: 08/02/2020 16:52   IR Angiogram Extremity Left  Result Date: 08/05/2020 CLINICAL DATA:  New onset of right visual  deficit with right-sided hemiparesis. Abnormal CT angiogram of the head and neck. EXAM: BILATERAL COMMON CAROTID AND INNOMINATE ANGIOGRAPHY COMPARISON:  Diagnostic catheter arteriogram of September 24, 2019 and CT angiogram the head and neck of August 02, 2020. MEDICATIONS: Heparin 2000 units IA. No antibiotic was administered within 1 hour of the procedure. ANESTHESIA/SEDATION: Versed 1 mg IV; Fentanyl 25 mcg IV Moderate Sedation Time:  43 minutes The patient was continuously monitored during the procedure by the interventional radiology nurse under my direct supervision. CONTRAST:  Isovue 300 approximately 65 mL FLUOROSCOPY TIME:  Fluoroscopy Time: 11 minutes 30 seconds (601 mGy). COMPLICATIONS: None immediate. TECHNIQUE: Informed written consent was obtained from the patient after a thorough discussion of the procedural risks, benefits and alternatives. All questions were addressed. Maximal Sterile Barrier Technique was utilized including caps, mask, sterile gowns, sterile gloves, sterile drape, hand hygiene and skin antiseptic. A timeout was performed prior to the initiation of the procedure. The right forearm to the wrist was prepped and draped in the usual sterile manner. The radial artery was identified with ultrasound, and its morphology documented. A dorsal palmar anastomosis was verified to be present. Using a micropuncture set, right radial access was obtained over a 0.018 inch micro guidewire. Over this, a 4/5 French radial sheath was inserted. The micro guidewire, and the obturator were removed. Good aspiration was obtained from the side port of the sheath. A cocktail of 2000 units of heparin, 2.5 mg of verapamil, and 200 mcg of nitroglycerin was then infused without event. A right radial arteriogram was obtained. Over a 0.035 inch Roadrunner guidewire, a 5 [place] Simmons 2  diagnostic catheter was advanced to the aortic arch region, and selectively positioned in the right vertebral artery, the right  common carotid artery, the left common carotid artery and the left subclavian artery. Following the procedure, hemostasis at the right radial puncture site was achieved with a wrist band. Distal right radial pulse was verified to be present. FINDINGS: The right vertebral artery origin is widely patent. The vessel is seen to opacify to the cranial skull base. Wide patency is seen of the right posterior-inferior cerebellar artery and the right vertebrobasilar junction. There is a mild to moderate stenosis of the proximal basilar artery. More distally the basilar artery, the right posterior cerebral artery, the superior cerebellar arteries and the anterior-inferior cerebellar arteries opacify into the capillary and venous phases. 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 Also noted is retrograde opacification of the left vertebrobasilar junction and more proximally of the distal left vertebral artery. Subsequent retrograde opacification via the left occipital artery of the left external carotid artery and the left internal carotid artery. The left cavernous and supraclinoid segments and subsequently partial opacification of the left middle cerebral artery is seen. Flash filling of the left posterior communicating artery proximally is evident. Also demonstrated is a proximal 25-50% stenosis of the non dominant left vertebrobasilar junction just distal to the origin of the left posterior-inferior cerebellar artery. Both the posterior-inferior cerebellar arteries demonstrate focal areas of caliber irregularity with narrowing likely representative of intracranial arteriosclerosis. The left common carotid arteriogram demonstrates complete angiographic occlusion of the left common carotid artery in its mid to its distal third associated with significant arteriosclerotic calcification. No distal reconstitution of the left internal carotid artery or of the left external  carotid artery is seen. The right common carotid arteriogram demonstrates wide patency of the right common carotid artery proximally and also in the proximal stented segment. The right internal carotid artery proximally within the stented segment and distally is widely patent. Patency is maintained of the petrous, the cavernous and the supraclinoid segments with mild arteriosclerotic narrowing of the horizontal segment of the petrous portion. The right middle and the right anterior cerebral artery opacify into the capillary and venous phases. Prompt cross-filling via the anterior communicating artery of the left anterior cerebral artery A1 and A2 segments, and also the left middle cerebral artery is seen. The superior division of the left middle cerebral artery demonstrates hypoattenuation in its proximal segment which may represent unopacified blood filling retrogradely from the left internal carotid artery as described above versus nonobstructive arteriosclerotic disease. Overall caliber of the left middle cerebral artery appears narrower than its counter part on the right side. The left subclavian arteriogram demonstrates patency of the thyrocervical trunk branches. There is suggestion of partial reconstitution of the left vertebral artery from collaterals arising from the ascending cervical branch of the thyrocervical trunk at the level of C3-C4. 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 Retrograde opacification of the left external carotid artery and the left internal carotid artery from the posterior circulation from the right vertebral artery and retrogradely the left vertebral artery via the left occipital artery. Occluded right external carotid artery at its origin. Cross-filling of the left anterior cerebral artery and the left middle cerebral artery from the right ICA via the anterior communicating artery, and partially from the posterior circulation as  described above. Occluded left posterior cerebral artery just distal to its origin. PLAN: Findings reviewed with the  patient and referring neurologist. Follow-up ultrasound of the carotids in 6 months. Electronically Signed   By: Luanne Bras M.D.   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 Abnormal CT angiogram of the head and neck. EXAM: BILATERAL COMMON CAROTID AND INNOMINATE ANGIOGRAPHY COMPARISON:  Diagnostic catheter arteriogram of September 24, 2019 and CT angiogram the head and neck of August 02, 2020. MEDICATIONS: Heparin 2000 units IA. No antibiotic was administered within 1 hour of the procedure. ANESTHESIA/SEDATION: Versed 1 mg IV; Fentanyl 25 mcg IV Moderate Sedation Time:  43 minutes The patient was continuously monitored during the procedure by the interventional radiology nurse under my direct supervision. CONTRAST:  Isovue 300 approximately 65 mL FLUOROSCOPY TIME:  Fluoroscopy Time: 11 minutes 30 seconds (601 mGy). COMPLICATIONS: None immediate. TECHNIQUE: Informed written consent was obtained from the patient after a thorough discussion of the procedural risks, benefits and alternatives. All questions were addressed. Maximal Sterile Barrier Technique was utilized including caps, mask, sterile gowns, sterile gloves, sterile drape, hand hygiene and skin antiseptic. A timeout was performed prior to the initiation of the procedure. The right forearm to the wrist was prepped and draped in the usual sterile manner. The radial artery was identified with ultrasound, and its morphology documented. A dorsal palmar anastomosis was verified to be present. Using a micropuncture set, right radial access was obtained over a 0.018 inch micro guidewire. Over this, a 4/5 French radial sheath was inserted. The micro guidewire, and the obturator were removed. Good aspiration was obtained from  the side port of the sheath. A cocktail of 2000 units of heparin, 2.5 mg of verapamil, and 200 mcg of nitroglycerin was then infused without event. A right radial arteriogram was obtained. Over a 0.035 inch Roadrunner guidewire, a 5 [place] Simmons 2 diagnostic catheter was advanced to the aortic arch region, and selectively positioned in the right vertebral artery, the right common carotid artery, the left common carotid artery and the left subclavian artery. Following the procedure, hemostasis at the right radial puncture site was achieved with a wrist band. Distal right radial pulse was verified to be present. FINDINGS: The right vertebral artery origin is widely patent. The vessel is seen to opacify to the cranial skull base. Wide patency is seen of the right posterior-inferior cerebellar artery and the right vertebrobasilar junction. There is a mild to moderate stenosis of the proximal basilar artery. More distally the basilar artery, the right posterior cerebral artery, the superior cerebellar arteries and the anterior-inferior cerebellar arteries opacify into the capillary and venous phases. 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 Also noted is retrograde opacification of the left vertebrobasilar junction and more proximally of the distal left vertebral artery. Subsequent retrograde opacification via the left occipital artery of the left external carotid artery and the left internal carotid artery. The left cavernous and supraclinoid segments and subsequently partial opacification of the left middle cerebral artery is seen. Flash filling of the left posterior communicating artery proximally is evident. Also demonstrated is a proximal 25-50% stenosis of the non dominant left vertebrobasilar junction just distal to the origin of the left posterior-inferior cerebellar artery. Both the posterior-inferior cerebellar arteries demonstrate focal areas of caliber  irregularity with narrowing likely representative of intracranial arteriosclerosis. The left common carotid arteriogram demonstrates complete angiographic occlusion of the left common carotid artery in its mid  to its distal third associated with significant arteriosclerotic calcification. No distal reconstitution of the left internal carotid artery or of the left external carotid artery is seen. The right common carotid arteriogram demonstrates wide patency of the right common carotid artery proximally and also in the proximal stented segment. The right internal carotid artery proximally within the stented segment and distally is widely patent. Patency is maintained of the petrous, the cavernous and the supraclinoid segments with mild arteriosclerotic narrowing of the horizontal segment of the petrous portion. The right middle and the right anterior cerebral artery opacify into the capillary and venous phases. Prompt cross-filling via the anterior communicating artery of the left anterior cerebral artery A1 and A2 segments, and also the left middle cerebral artery is seen. The superior division of the left middle cerebral artery demonstrates hypoattenuation in its proximal segment which may represent unopacified blood filling retrogradely from the left internal carotid artery as described above versus nonobstructive arteriosclerotic disease. Overall caliber of the left middle cerebral artery appears narrower than its counter part on the right side. The left subclavian arteriogram demonstrates patency of the thyrocervical trunk branches. There is suggestion of partial reconstitution of the left vertebral artery from collaterals arising from the ascending cervical branch of the thyrocervical trunk at the level of C3-C4. 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 Retrograde opacification of the left external carotid artery and the left internal carotid artery from the  posterior circulation from the right vertebral artery and retrogradely the left vertebral artery via the left occipital artery. Occluded right external carotid artery at its origin. Cross-filling of the left anterior cerebral artery and the left middle cerebral artery from the right ICA via the anterior communicating artery, and partially from the posterior circulation as described above. Occluded left posterior cerebral artery just distal to its origin. PLAN: Findings reviewed with the patient and referring neurologist. Follow-up ultrasound of the carotids in 6 months. Electronically Signed   By: Luanne Bras M.D.   On: 08/04/2020 10:45   CT CEREBRAL PERFUSION W CONTRAST  Result Date: 08/02/2020 CLINICAL DATA:  Code stroke EXAM: CT PERFUSION BRAIN TECHNIQUE: Multiphase CT imaging of the brain was performed following IV bolus contrast injection. Subsequent parametric perfusion maps were calculated using RAPID software. CONTRAST:  14mL OMNIPAQUE IOHEXOL 350 MG/ML SOLN COMPARISON:  Same day noncontrast head CT.  09/07/2019 head CT. FINDINGS: CT Brain Perfusion Findings: CBF (<30%) Volume: 38mL Perfusion (Tmax>6.0s) volume: 61mL Mismatch Volume: 92mL ASPECTS on noncontrast CT Head: 10 at 1:50 p.m. Today. Infarct Core: 33 mL Infarction Location:Left parietooccipital region. IMPRESSION: 33 mL core infarct involving left parietooccipital region with surrounding ischemic penumbra of 45 mL. Electronically Signed   By: Primitivo Gauze M.D.   On: 08/02/2020 13:58   ECHOCARDIOGRAM COMPLETE  Result Date: 08/04/2020    ECHOCARDIOGRAM REPORT   Patient Name:   CHRISTERPHER CLOS [facility] Date of Exam: 08/04/2020 Medical Rec #:  [id number]          Height:       66.0 in Accession #:    [id number]         Weight:       170.0 lb Date of Birth:  03/13/1956          BSA:          1.866 m Patient Age:    30 years           BP:  154/84 mmHg Patient Gender: M                  HR:           88 bpm. Exam Location:  Inpatient  Procedure: 2D Echo and Intracardiac Opacification Agent Indications:    Stroke 434.91 / I163.9  History:        Patient has prior history of Echocardiogram examinations, most                 recent 09/08/2019. Carotid Disease and COPD; Risk                 Factors:Hypertension, Dyslipidemia and Current Smoker.  Sonographer:    Darlina Sicilian RDCS Referring Phys: [id number] CHRISTOPHER P DANFORD IMPRESSIONS  1. Left ventricular ejection fraction, by estimation, is 55 to 60%. The left ventricle has normal function. The left ventricle demonstrates regional wall motion abnormalities with possible basal inferior hypokinesis. There is mild left ventricular hypertrophy. Left ventricular diastolic parameters are consistent with Grade I diastolic dysfunction (impaired relaxation).  2. Right ventricular systolic function is normal. The right ventricular size is normal. Tricuspid regurgitation signal is inadequate for assessing PA pressure.  3. The mitral valve is normal in structure. No evidence of mitral valve regurgitation. No evidence of mitral stenosis.  4. The aortic valve is tricuspid. Aortic valve regurgitation is not visualized. No aortic stenosis is present.  5. The inferior vena cava is normal in size with greater than 50% respiratory variability, suggesting right atrial pressure of 3 mmHg.  6. Technically difficult study with poor acoustic windows. FINDINGS  Left Ventricle: Left ventricular ejection fraction, by estimation, is 55 to 60%. The left ventricle has normal function. The left ventricle demonstrates regional wall motion abnormalities. Definity contrast agent was given IV to delineate the left ventricular endocardial borders. The left ventricular internal cavity size was normal in size. There is mild left ventricular hypertrophy. Left ventricular diastolic parameters are consistent with Grade I diastolic dysfunction (impaired relaxation). Right Ventricle: The right ventricular size is normal. No increase in  right ventricular wall thickness. Right ventricular systolic function is normal. Tricuspid regurgitation signal is inadequate for assessing PA pressure. Left Atrium: Left atrial size was normal in size. Right Atrium: Right atrial size was normal in size. Pericardium: There is no evidence of pericardial effusion. Mitral Valve: The mitral valve is normal in structure. There is mild thickening of the mitral valve leaflet(s). There is moderate calcification of the mitral valve leaflet(s). No evidence of mitral valve regurgitation. No evidence of mitral valve stenosis. Tricuspid Valve: The tricuspid valve is normal in structure. Tricuspid valve regurgitation is not demonstrated. Aortic Valve: The aortic valve is tricuspid. Aortic valve regurgitation is not visualized. No aortic stenosis is present. Pulmonic Valve: The pulmonic valve was normal in structure. Pulmonic valve regurgitation is not visualized. Aorta: The aortic root is normal in size and structure. Venous: The inferior vena cava is normal in size with greater than 50% respiratory variability, suggesting right atrial pressure of 3 mmHg. IAS/Shunts: No atrial level shunt detected by color flow Doppler.  LEFT VENTRICLE PLAX 2D LVIDd:         3.80 cm  Diastology LVIDs:         2.90 cm  LV e' lateral:   6.22 cm/s LV PW:         1.10 cm  LV E/e' lateral: 8.3 LV IVS:        1.40 cm  LV e' medial:  6.57 cm/s LVOT diam:     2.10 cm  LV E/e' medial:  7.9 LV SV:         46 LV SV Index:   25 LVOT Area:     3.46 cm  LEFT ATRIUM             Index LA diam:        2.90 cm 1.55 cm/m LA Vol (A2C):   36.8 ml 19.72 ml/m LA Vol (A4C):   31.5 ml 16.88 ml/m LA Biplane Vol: 36.6 ml 19.61 ml/m  AORTIC VALVE LVOT Vmax:   76.90 cm/s LVOT Vmean:  49.100 cm/s LVOT VTI:    0.133 m  AORTA Ao Root diam: 3.20 cm MITRAL VALVE MV Area (PHT): 3.72 cm    SHUNTS MV Decel Time: 204 msec    Systemic VTI:  0.13 m MV E velocity: 51.90 cm/s  Systemic Diam: 2.10 cm MV A velocity: 86.80 cm/s MV  E/A ratio:  0.60 Loralie Champagne MD Electronically signed by Loralie Champagne MD Signature Date/Time: 08/04/2020/4:48:24 PM    Final    CT HEAD CODE STROKE WO CONTRAST  Result Date: 08/14/2020 CLINICAL DATA:  Code stroke. Right-sided weakness and speech difficulty EXAM: CT HEAD WITHOUT CONTRAST TECHNIQUE: Contiguous axial images were obtained from the base of the skull through the vertex without intravenous contrast. COMPARISON:  08/02/2020 CT.  Brain MRI 08/02/2020 FINDINGS: Brain: Expected evolution of left occipital and frontal parietal convexity infarct which have become more low-density. Large remote right parietal infarct. Remote left perforator infarct at the basal ganglia. Chronic small vessel ischemic gliosis in the white matter. No hemorrhage, hydrocephalus, or masslike finding. Vascular: Negative Skull: Negative Sinuses/Orbits: Chronic right maxillary sinusitis. Other: These results were communicated to Milla Wahlberg at 11:58 amon 8/21/2021by text page via the [facility] messaging system. ASPECTS [facility] Stroke Program Early CT Score) Not scored in this setting IMPRESSION: 1. No acute finding when compared with prior. 2. Expected evolution of recent left occipital and border zone infarcts. 3. Remote right parietal infarct. Electronically Signed   By: Monte Fantasia M.D.   On: 08/14/2020 12:00   CT HEAD CODE STROKE WO CONTRAST  Result Date: 08/02/2020 CLINICAL DATA:  Code stroke.  Neuro deficit EXAM: CT HEAD WITHOUT CONTRAST TECHNIQUE: Contiguous axial images were obtained from the base of the skull through the vertex without intravenous contrast. COMPARISON:  09/07/2019 head CT. FINDINGS: Brain: Ill-defined hypodense region involving the left parietooccipital region with blurring of the gray-white interface is concerning for acute infarct (5:55). Sequela of remote right MCA territory and left basal ganglia insults. Background scattered and confluent supratentorial white matter hypodensities are nonspecific however  commonly associated with chronic microvascular ischemic changes. No mass lesion. No midline shift, ventriculomegaly or extra-axial fluid collection. Vascular: No hyperdense vessel. Bilateral skull base atherosclerotic calcifications. Skull: Negative for fracture or focal lesion. Sinuses/Orbits: Normal orbits. Clear paranasal sinuses. No mastoid effusion. Other: None. ASPECTS [facility] Stroke Program Early CT Score) - Ganglionic level infarction (caudate, lentiform nuclei, internal capsule, insula, M1-M3 cortex): 7 - Supraganglionic infarction (M4-M6 cortex): 3 Total score (0-10 with 10 being normal): 10 IMPRESSION: 1. Acute left parietooccipital infarct.  ASPECTS is 10. 2. Sequela of remote right MCA territory and left basal ganglia insults. 3. Chronic microvascular ischemic changes. Code stroke imaging results were communicated on 08/02/2020 at 1:51 pm to provider Dr. Curly Shores Via [employer] secure text paging. Electronically Signed   By: Primitivo Gauze M.D.   On: 08/02/2020 13:53   IR ANGIO INTRA  EXTRACRAN SEL COM CAROTID INNOMINATE BILAT MOD SED  Result Date: 08/05/2020 CLINICAL DATA:  New onset of right visual deficit with right-sided hemiparesis. Abnormal CT angiogram of the head and neck. EXAM: BILATERAL COMMON CAROTID AND INNOMINATE ANGIOGRAPHY COMPARISON:  Diagnostic catheter arteriogram of September 24, 2019 and CT angiogram the head and neck of August 02, 2020. MEDICATIONS: Heparin 2000 units IA. No antibiotic was administered within 1 hour of the procedure. ANESTHESIA/SEDATION: Versed 1 mg IV; Fentanyl 25 mcg IV Moderate Sedation Time:  43 minutes The patient was continuously monitored during the procedure by the interventional radiology nurse under my direct supervision. CONTRAST:  Isovue 300 approximately 65 mL FLUOROSCOPY TIME:  Fluoroscopy Time: 11 minutes 30 seconds (601 mGy). COMPLICATIONS: None immediate. TECHNIQUE: Informed written consent was obtained from the patient after a thorough discussion of  the procedural risks, benefits and alternatives. All questions were addressed. Maximal Sterile Barrier Technique was utilized including caps, mask, sterile gowns, sterile gloves, sterile drape, hand hygiene and skin antiseptic. A timeout was performed prior to the initiation of the procedure. The right forearm to the wrist was prepped and draped in the usual sterile manner. The radial artery was identified with ultrasound, and its morphology documented. A dorsal palmar anastomosis was verified to be present. Using a micropuncture set, right radial access was obtained over a 0.018 inch micro guidewire. Over this, a 4/5 French radial sheath was inserted. The micro guidewire, and the obturator were removed. Good aspiration was obtained from the side port of the sheath. A cocktail of 2000 units of heparin, 2.5 mg of verapamil, and 200 mcg of nitroglycerin was then infused without event. A right radial arteriogram was obtained. Over a 0.035 inch Roadrunner guidewire, a 5 [place] Simmons 2 diagnostic catheter was advanced to the aortic arch region, and selectively positioned in the right vertebral artery, the right common carotid artery, the left common carotid artery and the left subclavian artery. Following the procedure, hemostasis at the right radial puncture site was achieved with a wrist band. Distal right radial pulse was verified to be present. FINDINGS: The right vertebral artery origin is widely patent. The vessel is seen to opacify to the cranial skull base. Wide patency is seen of the right posterior-inferior cerebellar artery and the right vertebrobasilar junction. There is a mild to moderate stenosis of the proximal basilar artery. More distally the basilar artery, the right posterior cerebral artery, the superior cerebellar arteries and the anterior-inferior cerebellar arteries opacify into the capillary and venous phases. Angiographic occlusion of the left posterior cerebral artery just distal to its origin  is seen. Also noted is retrograde opacification of the left vertebrobasilar junction and more proximally of the distal left vertebral artery. Subsequent retrograde opacification via the left occipital artery of the left external carotid artery and the left internal carotid artery. The left cavernous and supraclinoid segments and subsequently partial opacification of the left middle cerebral artery is seen. Flash filling of the left posterior communicating artery proximally is evident. Also demonstrated is a proximal 25-50% stenosis of the non dominant left vertebrobasilar junction just distal to the origin of the left posterior-inferior cerebellar artery. Both the posterior-inferior cerebellar arteries demonstrate focal areas of caliber irregularity with narrowing likely representative of intracranial arteriosclerosis. The left common carotid arteriogram demonstrates complete angiographic occlusion of the left common carotid artery in its mid to its distal third associated with significant arteriosclerotic calcification. No distal reconstitution of the left internal carotid artery or of the left external carotid artery is  seen. The right common carotid arteriogram demonstrates wide patency of the right common carotid artery proximally and also in the proximal stented segment. The right internal carotid artery proximally within the stented segment and distally is widely patent. Patency is maintained of the petrous, the cavernous and the supraclinoid segments with mild arteriosclerotic narrowing of the horizontal segment of the petrous portion. The right middle and the right anterior cerebral artery opacify into the capillary and venous phases. Prompt cross-filling via the anterior communicating artery of the left anterior cerebral artery A1 and A2 segments, and also the left middle cerebral artery is seen. The superior division of the left middle cerebral artery demonstrates hypoattenuation in its proximal segment  which may represent unopacified blood filling retrogradely from the left internal carotid artery as described above versus nonobstructive arteriosclerotic disease. Overall caliber of the left middle cerebral artery appears narrower than its counter part on the right side. The left subclavian arteriogram demonstrates patency of the thyrocervical trunk branches. There is suggestion of partial reconstitution of the left vertebral artery from collaterals arising from the ascending cervical branch of the thyrocervical trunk at the level of C3-C4. 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 Retrograde opacification of the left external carotid artery and the left internal carotid artery from the posterior circulation from the right vertebral artery and retrogradely the left vertebral artery via the left occipital artery. Occluded right external carotid artery at its origin. Cross-filling of the left anterior cerebral artery and the left middle cerebral artery from the right ICA via the anterior communicating artery, and partially from the posterior circulation as described above. Occluded left posterior cerebral artery just distal to its origin. PLAN: Findings reviewed with the patient and referring neurologist. Follow-up ultrasound of the carotids in 6 months. Electronically Signed   By: Luanne Bras M.D.   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 Abnormal CT angiogram of the head and neck. EXAM: BILATERAL COMMON CAROTID AND INNOMINATE ANGIOGRAPHY COMPARISON:  Diagnostic catheter arteriogram of September 24, 2019 and CT angiogram the head and neck of August 02, 2020. MEDICATIONS: Heparin 2000 units IA. No antibiotic was administered within 1 hour of the procedure. ANESTHESIA/SEDATION: Versed 1 mg IV; Fentanyl 25 mcg IV Moderate  Sedation Time:  43 minutes The patient was continuously monitored during the procedure by the interventional radiology nurse under my direct supervision. CONTRAST:  Isovue 300 approximately 65 mL FLUOROSCOPY TIME:  Fluoroscopy Time: 11 minutes 30 seconds (601 mGy). COMPLICATIONS: None immediate. TECHNIQUE: Informed written consent was obtained from the patient after a thorough discussion of the procedural risks, benefits and alternatives. All questions were addressed. Maximal Sterile Barrier Technique was utilized including caps, mask, sterile gowns, sterile gloves, sterile drape, hand hygiene and skin antiseptic. A timeout was performed prior to the initiation of the procedure. The right forearm to the wrist was prepped and draped in the usual sterile manner. The radial artery was identified with ultrasound, and its morphology documented. A dorsal palmar anastomosis was verified to be present. Using a micropuncture set, right radial access was obtained over a 0.018 inch micro guidewire. Over this, a 4/5 French radial sheath was inserted. The micro guidewire, and the obturator were removed. Good aspiration was obtained from the side port of the sheath. A cocktail of 2000 units of heparin, 2.5 mg of verapamil, and  200 mcg of nitroglycerin was then infused without event. A right radial arteriogram was obtained. Over a 0.035 inch Roadrunner guidewire, a 5 [place] Simmons 2 diagnostic catheter was advanced to the aortic arch region, and selectively positioned in the right vertebral artery, the right common carotid artery, the left common carotid artery and the left subclavian artery. Following the procedure, hemostasis at the right radial puncture site was achieved with a wrist band. Distal right radial pulse was verified to be present. FINDINGS: The right vertebral artery origin is widely patent. The vessel is seen to opacify to the cranial skull base. Wide patency is seen of the right posterior-inferior cerebellar  artery and the right vertebrobasilar junction. There is a mild to moderate stenosis of the proximal basilar artery. More distally the basilar artery, the right posterior cerebral artery, the superior cerebellar arteries and the anterior-inferior cerebellar arteries opacify into the capillary and venous phases. 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 Also noted is retrograde opacification of the left vertebrobasilar junction and more proximally of the distal left vertebral artery. Subsequent retrograde opacification via the left occipital artery of the left external carotid artery and the left internal carotid artery. The left cavernous and supraclinoid segments and subsequently partial opacification of the left middle cerebral artery is seen. Flash filling of the left posterior communicating artery proximally is evident. Also demonstrated is a proximal 25-50% stenosis of the non dominant left vertebrobasilar junction just distal to the origin of the left posterior-inferior cerebellar artery. Both the posterior-inferior cerebellar arteries demonstrate focal areas of caliber irregularity with narrowing likely representative of intracranial arteriosclerosis. The left common carotid arteriogram demonstrates complete angiographic occlusion of the left common carotid artery in its mid to its distal third associated with significant arteriosclerotic calcification. No distal reconstitution of the left internal carotid artery or of the left external carotid artery is seen. The right common carotid arteriogram demonstrates wide patency of the right common carotid artery proximally and also in the proximal stented segment. The right internal carotid artery proximally within the stented segment and distally is widely patent. Patency is maintained of the petrous, the cavernous and the supraclinoid segments with mild arteriosclerotic narrowing of the horizontal segment of  the petrous portion. The right middle and the right anterior cerebral artery opacify into the capillary and venous phases. Prompt cross-filling via the anterior communicating artery of the left anterior cerebral artery A1 and A2 segments, and also the left middle cerebral artery is seen. The superior division of the left middle cerebral artery demonstrates hypoattenuation in its proximal segment which may represent unopacified blood filling retrogradely from the left internal carotid artery as described above versus nonobstructive arteriosclerotic disease. Overall caliber of the left middle cerebral artery appears narrower than its counter part on the right side. The left subclavian arteriogram demonstrates patency of the thyrocervical trunk branches. There is suggestion of partial reconstitution of the left vertebral artery from collaterals arising from the ascending cervical branch of the thyrocervical trunk at the level of C3-C4. 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 Retrograde opacification of the left external carotid artery and the left internal carotid artery from the posterior circulation from the right vertebral artery and retrogradely the left vertebral artery via the left occipital artery. Occluded right external carotid artery at its origin. Cross-filling of the left anterior cerebral artery and the left middle cerebral artery from the right ICA via the anterior communicating  artery, and partially from the posterior circulation as described above. Occluded left posterior cerebral artery just distal to its origin. PLAN: Findings reviewed with the patient and referring neurologist. Follow-up ultrasound of the carotids in 6 months. Electronically Signed   By: Luanne Bras M.D.   On: 08/04/2020 10:45    Assessment: 64 y.o. 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 He did have orthostasis for the last 2 days. Last seen baseline 8:30am. Now developed global aphasia, right hemiparesis. BP 118/60 on coreg. Put him on Trendelenburg position and given IV bolus. Symptoms improving. CT no acute abnormality.  CT head and neck continued left CCA occlusion, no acute vascular change.  However, new findings of apical left lung worrisome for malignancy.  Plan: - Telemetry monitoring  - Frequent neuro checks q2 x 12 and then q4 - continue IVF - albumin q6h x 4 - d/c coreg - bed rest for today - check orthostatic vital in am - if still orthostasis will add midodrine/florinef  - continue DAPT and statin if passed swallow - will follow  Thank you for this consultation and allowing [place] to participate in the care of this patient.  Rosalin Hawking, MD PhD Stroke Neurology 08/14/2020 2:05 PM

## 2020-08-14 NOTE — Progress Notes (Signed)
Nurse called to room, pt non verbal except for mumbling, pt unable to follow commands at this time, no grip or movement to right upper extremity, pt able to move right leg with flexion and extension noted. MD notified with new orders received.

## 2020-08-15 ENCOUNTER — Inpatient Hospital Stay (HOSPITAL_COMMUNITY): Payer: Medicare Other | Admitting: Speech Pathology

## 2020-08-15 DIAGNOSIS — Z8673 Personal history of transient ischemic attack (TIA), and cerebral infarction without residual deficits: Secondary | ICD-10-CM

## 2020-08-15 DIAGNOSIS — R918 Other nonspecific abnormal finding of lung field: Secondary | ICD-10-CM

## 2020-08-15 DIAGNOSIS — I6522 Occlusion and stenosis of left carotid artery: Secondary | ICD-10-CM

## 2020-08-15 LAB — CBC
HCT: 24.9 % — ABNORMAL LOW (ref 39.0–52.0)
Hemoglobin: 7 g/dL — ABNORMAL LOW (ref 13.0–17.0)
MCH: 21 pg — ABNORMAL LOW (ref 26.0–34.0)
MCHC: 28.1 g/dL — ABNORMAL LOW (ref 30.0–36.0)
MCV: 74.8 fL — ABNORMAL LOW (ref 80.0–100.0)
Platelets: 236 10*3/uL (ref 150–400)
RBC: 3.33 MIL/uL — ABNORMAL LOW (ref 4.22–5.81)
RDW: 19.7 % — ABNORMAL HIGH (ref 11.5–15.5)
WBC: 5.9 10*3/uL (ref 4.0–10.5)
nRBC: 0 % (ref 0.0–0.2)

## 2020-08-15 LAB — BASIC METABOLIC PANEL
Anion gap: 7 (ref 5–15)
BUN: 8 mg/dL (ref 8–23)
CO2: 23 mmol/L (ref 22–32)
Calcium: 8.3 mg/dL — ABNORMAL LOW (ref 8.9–10.3)
Chloride: 106 mmol/L (ref 98–111)
Creatinine, Ser: 0.6 mg/dL — ABNORMAL LOW (ref 0.61–1.24)
GFR calc Af Amer: >60 mL/min (ref >60)
GFR calc non Af Amer: >60 mL/min (ref >60)
Glucose, Bld: 111 mg/dL — ABNORMAL HIGH (ref 70–99)
Potassium: 3.6 mmol/L (ref 3.5–5.1)
Sodium: 136 mmol/L (ref 135–145)

## 2020-08-15 LAB — OCCULT BLOOD X 1 CARD TO LAB, STOOL: Fecal Occult Bld: POSITIVE — AB

## 2020-08-15 LAB — ABO/RH: ABO/RH(D): O NEG

## 2020-08-15 LAB — PREPARE RBC (CROSSMATCH)

## 2020-08-15 MED ORDER — FUROSEMIDE 10 MG/ML IJ SOLN
20.0000 mg | Freq: Once | INTRAMUSCULAR | Status: AC
  Administered 2020-08-15: 20 mg via INTRAVENOUS
  Filled 2020-08-15: qty 2

## 2020-08-15 MED ORDER — FERROUS SULFATE 325 (65 FE) MG PO TABS
325.0000 mg | ORAL_TABLET | Freq: Three times a day (TID) | ORAL | Status: DC
  Administered 2020-08-15 – 2020-08-19 (×8): 325 mg via ORAL
  Filled 2020-08-15 (×9): qty 1

## 2020-08-15 MED ORDER — DIPHENHYDRAMINE HCL 25 MG PO CAPS
25.0000 mg | ORAL_CAPSULE | Freq: Once | ORAL | Status: AC
  Administered 2020-08-15: 25 mg via ORAL
  Filled 2020-08-15: qty 1

## 2020-08-15 MED ORDER — SODIUM CHLORIDE 0.9% IV SOLUTION: Freq: Once | INTRAVENOUS | Status: AC

## 2020-08-15 MED ORDER — ACETAMINOPHEN 325 MG PO TABS
650.0000 mg | ORAL_TABLET | Freq: Once | ORAL | Status: AC
  Administered 2020-08-15: 650 mg via ORAL
  Filled 2020-08-15: qty 2

## 2020-08-15 MED ORDER — MIDODRINE HCL 5 MG PO TABS
5.0000 mg | ORAL_TABLET | Freq: Three times a day (TID) | ORAL | Status: DC
  Administered 2020-08-15 – 2020-08-19 (×9): 5 mg via ORAL
  Filled 2020-08-15 (×10): qty 1

## 2020-08-15 NOTE — Progress Notes (Addendum)
At 2030, patient talking and moving all extremities. Right sided weakness. A & O to self. Ate 60% of supper and drank 120cc's of water. 0.9% NS infusing at 50cc's/HR. PRN tylenol given at 2016 for generalized discomfort.[facility] A

## 2020-08-15 NOTE — Progress Notes (Signed)
Speech Language Pathology Daily Session Note  Patient Details  Name: Nathan Hart MRN: [id number] Date of Birth: 12-06-1956  Today's Date: 08/15/2020 SLP Individual Time: 0950-1020 SLP Individual Time Calculation (min): 30 min  Short Term Goals: Week 2: SLP Short Term Goal 1 (Week 2): STG's=LTG's due to ELOS  Skilled Therapeutic Interventions:  Pt was seen for skilled ST targeting cognitive goals.  Pt resting in bed upon therapist's arrival.  Per report pt s/p code stroke yesterday.  CT head negative for acute event, on bed rest yesterday.  No new orders appreciated today.  Pt responsive but with slowed processing.  Discussed with RN who said pt was ok in chair as long as he remained asymptomatic in light of negative CT and no new orders from MD.  Pt transferred briefly to chair (BP 130/68 in sitting) for basic self care tasks at the sink without being symptomatic for orthostasis.  Pt washed his face and brushed his teeth with min assist for task set up in light of visual challenges.  During session, RN entered room informing therapist MD had since reached out to her to extend bedrest into today and pt was returned to bed and left laying flat with call bell within reach and bed alarm set.  Continue per current plan of care.    Pain Pain Assessment Pain Scale: 0-10 Pain Score: 0-No pain  Therapy/Group: Individual Therapy  N[facility], [facility] Hart 08/15/2020, 12:48 PM

## 2020-08-15 NOTE — Progress Notes (Signed)
1500 orthostatic VS not completed at that time. Pt refused to stand and wanted to sleep. Taken at 1830 from sitting to standing, pt was able to tolerate and was not systematic. All needs met, call bell in reach and alarm on and functioning.

## 2020-08-15 NOTE — Discharge Summary (Signed)
Physician Discharge Summary  Patient ID: BERMAN GRAINGER MRN: [id number] DOB/AGE: September 12, 1956 64 y.o.  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 EXAM: CT ANGIOGRAPHY HEAD AND NECK TECHNIQUE: Multidetector CT imaging of the head and neck was performed using the standard protocol during bolus administration of intravenous contrast. Multiplanar CT image reconstructions and MIPs were obtained to evaluate the vascular anatomy. Carotid stenosis measurements (when applicable) are obtained utilizing NASCET criteria, using the distal internal carotid diameter as the denominator. CONTRAST:  103mL OMNIPAQUE IOHEXOL 350 MG/ML SOLN COMPARISON:  08/02/2020 FINDINGS: CTA NECK FINDINGS Aortic arch: Atheromatous plaque. Right carotid system: Atheromatous wall thickening diffusely in the common carotid and ICA. There is a stent spanning the bifurcation which is patent. No downstream beading or dissection Left carotid system: Chronic occlusion beginning just after the common carotid origin with irregular calcified plaque. There is reconstitution at the bifurcation with narrowing less intensely enhancing left ICA that is patent into the skull. Vertebral arteries: Atheromatous plaque in the bilateral subclavian  artery. Dominant right vertebral artery which is widely patent to the dura. No flow seen at the left V1 segment, but there is reconstitution and flow seen by the basilar. Skeleton: Advanced cervical spine degeneration. No acute or aggressive finding. Other neck: No acute finding. Upper chest: Emphysema. Scattered pulmonary nodules, most notably at nodule measuring 15 mm at the left apex which appears to invade the adjacent pleura. Review of the MIP images confirms the above findings CTA HEAD FINDINGS Anterior circulation: Heavily calcified bilateral carotid siphons and petrous segments. The segments are recently evaluated by catheter angiography which is better due to the degree of artifact from skull base and calcified plaque. No emergent branch occlusion or aneurysm. Posterior circulation: Right vertebral artery dominance. Left V4 segment calcified plaque. Basilar artery is widely patent. Fetal type left PCA flow, which may be acquired. Venous sinuses: Diffusely patent Anatomic variants: As above Review of the MIP images confirms the above findings These results were communicated to Dr Erlinda Hong at 12:47 pmon 8/21/2021by text page via the [facility] messaging system. IMPRESSION: 1. No emergent vascular finding. No change from CTA and catheter angiogram earlier this month. 2. 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 3. Diffusely patent right cervical carotid stent. 4. Pulmonary nodules in the apical lungs including a 15 mm left apical subpleural nodule which appears to invade the pleura, primarily worrisome for malignancy/metastatic disease. Recommend dedicated chest CT. Electronically Signed   By: Monte Fantasia M.D.   On: 08/14/2020 12:49   CT Code Stroke CTA Head W/WO contrast  Result Date: 08/02/2020 CLINICAL DATA:  Neuro deficit EXAM: CT ANGIOGRAPHY HEAD AND NECK TECHNIQUE: Multidetector CT imaging of the head and neck was performed using the standard protocol during bolus  administration of intravenous contrast. Multiplanar CT image reconstructions and MIPs were obtained to evaluate the vascular anatomy. Carotid  stenosis measurements (when applicable) are obtained utilizing NASCET criteria, using the distal internal carotid diameter as the denominator. CONTRAST:  160mL OMNIPAQUE IOHEXOL 350 MG/ML SOLN COMPARISON:  Same day noncontrast head CT and CT cerebral perfusion. 09/08/2019 CT neck. FINDINGS: CTA NECK FINDINGS Aortic arch: Standard branching. Imaged portion shows no evidence of aneurysm or dissection. No significant stenosis of the major arch vessel origins. Calcified atheromatous plaque. Right carotid system: No evidence of dissection, stenosis (50% or greater) or occlusion. Calcified atheromatous plaque at the bifurcation. Patent right stent spanning the distal common/proximal internal carotid arteries. Left carotid system: The proximal common left carotid artery demonstrates complete occlusion shortly after its takeoff to the level of the bifurcation where there is extensive calcified and noncalcified atheromatous plaque. There is a distal reconstitution of the left internal carotid and external carotid arteries. The left internal carotid artery demonstrates comparatively diminished opacification. Vertebral arteries: Dominant right vertebral artery. Diminutive left vertebral artery with V4 segment atherosclerotic calcifications. No definite focal high-grade narrowing however diffuse vessel wall irregularity likely reflects atheromatous disease. No evidence of dissection, stenosis (50% or greater) or occlusion. Skeleton: Multilevel spondylosis with reversal of cervical lordosis. Grade 1 C2-3 and C7-T1 anterolisthesis. Grade 1 C5-6 and C6-7 retrolisthesis. No acute osseous abnormality. Other neck: No adenopathy.  No soft tissue mass. Upper chest: Emphysema.  Biapical atelectasis. Review of the MIP images confirms the above findings CTA HEAD FINDINGS Anterior circulation:  Asymmetrically diminished opacification of the left internal carotid artery. Bilateral carotid siphon atherosclerotic calcifications, right greater than left. Distal left ICA cervical segment and petrous segment atherosclerotic calcifications. Diminutive left M1 segment. No aneurysm or vascular malformation. Posterior circulation: Dominant right vertebral artery. Dominant right PCA. Fetal origin of the left PCA. Mild-to-moderate irregularity involving the left P2 segment likely secondary to atheromatous disease. No aneurysm, or vascular malformation. Asymmetrically diminished opacification of the left parietooccipital cortical branches. Venous sinuses: No evidence of thrombosis. Anatomic variants: Bilateral PCOM hypoplasia. Review of the MIP images confirms the above findings IMPRESSION: 1. 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 Distal reconstitution of the left internal and external carotid arteries. 2. Asymmetrically diminished opacification of the left internal carotid artery to the terminus. 3. Mild to moderate narrowing and irregularity involving the left P2 segment, likely secondary to atheromatous disease. 4. Asymmetrically diminished opacification of left parietooccipital cortical branches. 5. Patent right common/proximal internal carotid artery stent. 6. Diminutive left vertebral artery with V4 segment atherosclerotic calcifications. These results were called by telephone at the time of interpretation on 08/02/2020 at 2:46 pm to provider Dr. Erlinda Hong, Who verbally acknowledged these results. Electronically Signed   By: Primitivo Gauze M.D.   On: 08/02/2020 14:58   CT HEAD WO CONTRAST  Result Date: 08/18/2020 CLINICAL DATA:  Follow-up stroke.  Right-sided weakness. EXAM: CT HEAD WITHOUT CONTRAST TECHNIQUE: Contiguous axial images were obtained from the base of the skull through the vertex without intravenous contrast. COMPARISON:  Head  CT 4 days ago.  MRI 08/02/2020. FINDINGS: Brain: No new CT finding. Chronic small-vessel ischemic changes affect the pons. No focal cerebellar insult. Right cerebral hemisphere shows chronic small-vessel ischemic changes of the white matter and an old right parietal cortical and subcortical infarction. Left hemisphere shows subacute infarction in the left occipital and posterolateral temporal lobe. Mild persistent swelling but no evidence of hemorrhagic transformation. Small acute infarctions shown better by MRI elsewhere extending from front to back in a watershed distribution are subtle  by CT, but there is no sign of progression or new insult. Old infarction in the left basal ganglia. No hydrocephalus. No extra-axial collection. Vascular: There is atherosclerotic calcification of the major vessels at the base of the brain. Skull: Negative Sinuses/Orbits: Chronic mucoperiosteal thickening of the right maxillary sinus. No acute sinus inflammation seen. Orbits negative. Other: None IMPRESSION: No new finding. Subacute infarction in the left occipital and posterolateral temporal lobe with mild persistent swelling but no evidence of hemorrhagic transformation. Small subacute infarctions shown better by MRI elsewhere extending from front to back in a watershed distribution are subtle by CT, but there is no sign of progression or new insult. Electronically Signed   By: Nelson Chimes M.D.   On: 08/18/2020 13:55   CT ANGIO NECK W OR WO CONTRAST  Result Date: 08/14/2020 CLINICAL DATA:  Code stroke with right-sided weakness. EXAM: CT ANGIOGRAPHY HEAD AND NECK TECHNIQUE: Multidetector CT imaging of the head and neck was performed using the standard protocol during bolus administration of intravenous contrast. Multiplanar CT image reconstructions and MIPs were obtained to evaluate the vascular anatomy. Carotid stenosis measurements (when applicable) are obtained utilizing NASCET criteria, using the distal internal carotid  diameter as the denominator. CONTRAST:  8mL OMNIPAQUE IOHEXOL 350 MG/ML SOLN COMPARISON:  08/02/2020 FINDINGS: CTA NECK FINDINGS Aortic arch: Atheromatous plaque. Right carotid system: Atheromatous wall thickening diffusely in the common carotid and ICA. There is a stent spanning the bifurcation which is patent. No downstream beading or dissection Left carotid system: Chronic occlusion beginning just after the common carotid origin with irregular calcified plaque. There is reconstitution at the bifurcation with narrowing less intensely enhancing left ICA that is patent into the skull. Vertebral arteries: Atheromatous plaque in the bilateral subclavian artery. Dominant right vertebral artery which is widely patent to the dura. No flow seen at the left V1 segment, but there is reconstitution and flow seen by the basilar. Skeleton: Advanced cervical spine degeneration. No acute or aggressive finding. Other neck: No acute finding. Upper chest: Emphysema. Scattered pulmonary nodules, most notably at nodule measuring 15 mm at the left apex which appears to invade the adjacent pleura. Review of the MIP images confirms the above findings CTA HEAD FINDINGS Anterior circulation: Heavily calcified bilateral carotid siphons and petrous segments. The segments are recently evaluated by catheter angiography which is better due to the degree of artifact from skull base and calcified plaque. No emergent branch occlusion or aneurysm. Posterior circulation: Right vertebral artery dominance. Left V4 segment calcified plaque. Basilar artery is widely patent. Fetal type left PCA flow, which may be acquired. Venous sinuses: Diffusely patent Anatomic variants: As above Review of the MIP images confirms the above findings These results were communicated to Dr Erlinda Hong at 12:47 pmon 8/21/2021by text page via the [facility] messaging system. IMPRESSION: 1. No emergent vascular finding. No change from CTA and catheter angiogram earlier this month. 2.  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 3. Diffusely patent right cervical carotid stent. 4. Pulmonary nodules in the apical lungs including a 15 mm left apical subpleural nodule which appears to invade the pleura, primarily worrisome for malignancy/metastatic disease. Recommend dedicated chest CT. Electronically Signed   By: Monte Fantasia M.D.   On: 08/14/2020 12:49   CT Code Stroke CTA Neck W/WO contrast  Result Date: 08/02/2020 CLINICAL DATA:  Neuro deficit EXAM: CT ANGIOGRAPHY HEAD AND NECK TECHNIQUE: Multidetector CT imaging of the head and neck was performed using the standard protocol during bolus administration  of intravenous contrast. Multiplanar CT image reconstructions and MIPs were obtained to evaluate the vascular anatomy. Carotid stenosis measurements (when applicable) are obtained utilizing NASCET criteria, using the distal internal carotid diameter as the denominator. CONTRAST:  151mL OMNIPAQUE IOHEXOL 350 MG/ML SOLN COMPARISON:  Same day noncontrast head CT and CT cerebral perfusion. 09/08/2019 CT neck. FINDINGS: CTA NECK FINDINGS Aortic arch: Standard branching. Imaged portion shows no evidence of aneurysm or dissection. No significant stenosis of the major arch vessel origins. Calcified atheromatous plaque. Right carotid system: No evidence of dissection, stenosis (50% or greater) or occlusion. Calcified atheromatous plaque at the bifurcation. Patent right stent spanning the distal common/proximal internal carotid arteries. Left carotid system: The proximal common left carotid artery demonstrates complete occlusion shortly after its takeoff to the level of the bifurcation where there is extensive calcified and noncalcified atheromatous plaque. There is a distal reconstitution of the left internal carotid and external carotid arteries. The left internal carotid artery demonstrates comparatively diminished opacification. Vertebral arteries: Dominant right  vertebral artery. Diminutive left vertebral artery with V4 segment atherosclerotic calcifications. No definite focal high-grade narrowing however diffuse vessel wall irregularity likely reflects atheromatous disease. No evidence of dissection, stenosis (50% or greater) or occlusion. Skeleton: Multilevel spondylosis with reversal of cervical lordosis. Grade 1 C2-3 and C7-T1 anterolisthesis. Grade 1 C5-6 and C6-7 retrolisthesis. No acute osseous abnormality. Other neck: No adenopathy.  No soft tissue mass. Upper chest: Emphysema.  Biapical atelectasis. Review of the MIP images confirms the above findings CTA HEAD FINDINGS Anterior circulation: Asymmetrically diminished opacification of the left internal carotid artery. Bilateral carotid siphon atherosclerotic calcifications, right greater than left. Distal left ICA cervical segment and petrous segment atherosclerotic calcifications. Diminutive left M1 segment. No aneurysm or vascular malformation. Posterior circulation: Dominant right vertebral artery. Dominant right PCA. Fetal origin of the left PCA. Mild-to-moderate irregularity involving the left P2 segment likely secondary to atheromatous disease. No aneurysm, or vascular malformation. Asymmetrically diminished opacification of the left parietooccipital cortical branches. Venous sinuses: No evidence of thrombosis. Anatomic variants: Bilateral PCOM hypoplasia. Review of the MIP images confirms the above findings IMPRESSION: 1. 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 Distal reconstitution of the left internal and external carotid arteries. 2. Asymmetrically diminished opacification of the left internal carotid artery to the terminus. 3. Mild to moderate narrowing and irregularity involving the left P2 segment, likely secondary to atheromatous disease. 4. Asymmetrically diminished opacification of left parietooccipital cortical branches. 5.  Patent right common/proximal internal carotid artery stent. 6. Diminutive left vertebral artery with V4 segment atherosclerotic calcifications. These results were called by telephone at the time of interpretation on 08/02/2020 at 2:46 pm to provider Dr. Erlinda Hong, Who verbally acknowledged these results. Electronically Signed   By: Primitivo Gauze M.D.   On: 08/02/2020 14:58   CT CHEST WO CONTRAST  Result Date: 08/16/2020 CLINICAL DATA:  COPD, oxygen dependent. Follow-up apical pulmonary nodule seen on recent neck CT angiogram. EXAM: CT CHEST WITHOUT CONTRAST TECHNIQUE: Multidetector CT imaging of the chest was performed following the standard protocol without IV contrast. COMPARISON:  08/14/2020 head and neck CT angiogram. 09/07/2019 chest CT angiogram. FINDINGS: Cardiovascular: Normal heart size. No significant pericardial effusion/thickening. Left anterior descending and left circumflex coronary atherosclerosis. Atherosclerotic nonaneurysmal thoracic aorta. Normal caliber pulmonary arteries. Mediastinum/Nodes: No discrete thyroid nodules. Unremarkable esophagus. No pathologically enlarged axillary, mediastinal or hilar lymph nodes, noting limited sensitivity for the detection of hilar adenopathy on this noncontrast study.  Lungs/Pleura: No pneumothorax. No pleural effusion. Severe centrilobular emphysema with diffuse bronchial wall thickening. Innumerable (> than 20) solid pulmonary nodules scattered throughout both lungs, all new since 09/07/2019 chest CT, largest a pleural based 1.8 x 1.2 cm nodule in the medial apical left upper lobe with possible mediastinal invasion (series 5/image 21). Additional representative pulmonary nodules measure 1.0 cm in the lingula (series 5/image 86), 1.2 cm in the left lower lobe (series 5/image 106) and 0.9 cm posteriorly in right upper lobe (series 5/image 73). Complete right middle lobe atelectasis. Upper abdomen: Indistinct hypodense masslike 8.4 cm left liver dome focus  (series 3/image 141), new. Stable 3.9 cm right adrenal adenoma with density -10 HU. Partially visualized 3.3 cm anterior upper left renal simple cyst. Layering hyperdensity in the gallbladder could represent small gallstones or vicarious excretion of IV contrast. Musculoskeletal: No aggressive appearing focal osseous lesions. Moderate thoracic spondylosis. IMPRESSION: 1. Innumerable (> than 20) solid pulmonary nodules scattered throughout both lungs, all new since 09/07/2019 chest CT, largest a pleural based 1.8 cm nodule in the medial apical left upper lobe with possible associated mediastinal invasion. Findings are most compatible with pulmonary metastases, although the dominant medial apical left upper lobe nodule could represent a primary bronchogenic carcinoma. 2. Indistinct hypodense masslike 8.4 cm left liver dome focus, new, suspicious for liver metastasis. MRI abdomen without and with IV contrast recommended for further characterization. 3. Complete right middle lobe atelectasis. 4. Two-vessel coronary atherosclerosis. 5. Stable right adrenal adenoma. 6. Aortic Atherosclerosis (ICD10-I70.0) and Emphysema (ICD10-J43.9). Electronically Signed   By: Ilona Sorrel M.D.   On: 08/16/2020 12:52   MR BRAIN WO CONTRAST  Result Date: 08/02/2020 CLINICAL DATA:  Stroke, follow-up EXAM: MRI HEAD WITHOUT CONTRAST TECHNIQUE: Multiplanar, multiecho pulse sequences of the brain and surrounding structures were obtained without intravenous contrast. COMPARISON:  Correlation made with prior CT imaging FINDINGS: Brain: There is reduced diffusion in the left occipital lobe extending into the temporal and parietal lobes. There is also patchy reduced diffusion in the left frontal lobe. A small focus is present in the posterior left thalamus. No significant mass effect. There is minimal susceptibility hypointensity primarily in the left parietooccipital lobes likely reflecting petechial hemorrhage. Chronic infarct centered within  the right parietal lobe. Additional chronic infarct of the left basal ganglia and adjacent white matter. Superimposed patchy and confluent areas of T2 hyperintensity in the supratentorial and pontine white matter are nonspecific but probably reflect moderate chronic microvascular ischemic changes. Prominence of the ventricles and sulci reflects generalized parenchymal volume loss. Vascular: Major vessel flow voids at the skull base are preserved. Skull and upper cervical spine: Normal marrow signal is preserved. Sinuses/Orbits: Paranasal sinuses are aerated. Orbits are unremarkable. Other: Sella is unremarkable.  Mastoid air cells are clear. IMPRESSION: Acute infarcts involving left posterior greater than anterior circulations, noting presence of a fetal left PCA on the prior CTA. No significant mass effect. There is minor petechial hemorrhage. Moderate chronic microvascular ischemic changes.  Chronic infarcts. Electronically Signed   By: Macy Mis M.D.   On: 08/02/2020 16:52   IR Angiogram Extremity Left  Result Date: 08/05/2020 CLINICAL DATA:  New onset of right visual deficit with right-sided hemiparesis. Abnormal CT angiogram of the head and neck. EXAM: BILATERAL COMMON CAROTID AND INNOMINATE ANGIOGRAPHY COMPARISON:  Diagnostic catheter arteriogram of September 24, 2019 and CT angiogram the head and neck of August 02, 2020. MEDICATIONS: Heparin 2000 units IA. No antibiotic was administered within 1 hour of the procedure. ANESTHESIA/SEDATION: Versed  1 mg IV; Fentanyl 25 mcg IV Moderate Sedation Time:  43 minutes The patient was continuously monitored during the procedure by the interventional radiology nurse under my direct supervision. CONTRAST:  Isovue 300 approximately 65 mL FLUOROSCOPY TIME:  Fluoroscopy Time: 11 minutes 30 seconds (601 mGy). COMPLICATIONS: None immediate. TECHNIQUE: Informed written consent was obtained from the patient after a thorough discussion of the procedural risks, benefits  and alternatives. All questions were addressed. Maximal Sterile Barrier Technique was utilized including caps, mask, sterile gowns, sterile gloves, sterile drape, hand hygiene and skin antiseptic. A timeout was performed prior to the initiation of the procedure. The right forearm to the wrist was prepped and draped in the usual sterile manner. The radial artery was identified with ultrasound, and its morphology documented. A dorsal palmar anastomosis was verified to be present. Using a micropuncture set, right radial access was obtained over a 0.018 inch micro guidewire. Over this, a 4/5 French radial sheath was inserted. The micro guidewire, and the obturator were removed. Good aspiration was obtained from the side port of the sheath. A cocktail of 2000 units of heparin, 2.5 mg of verapamil, and 200 mcg of nitroglycerin was then infused without event. A right radial arteriogram was obtained. Over a 0.035 inch Roadrunner guidewire, a 5 [place] Simmons 2 diagnostic catheter was advanced to the aortic arch region, and selectively positioned in the right vertebral artery, the right common carotid artery, the left common carotid artery and the left subclavian artery. Following the procedure, hemostasis at the right radial puncture site was achieved with a wrist band. Distal right radial pulse was verified to be present. FINDINGS: The right vertebral artery origin is widely patent. The vessel is seen to opacify to the cranial skull base. Wide patency is seen of the right posterior-inferior cerebellar artery and the right vertebrobasilar junction. There is a mild to moderate stenosis of the proximal basilar artery. More distally the basilar artery, the right posterior cerebral artery, the superior cerebellar arteries and the anterior-inferior cerebellar arteries opacify into the capillary and venous phases. 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 Also noted is  retrograde opacification of the left vertebrobasilar junction and more proximally of the distal left vertebral artery. Subsequent retrograde opacification via the left occipital artery of the left external carotid artery and the left internal carotid artery. The left cavernous and supraclinoid segments and subsequently partial opacification of the left middle cerebral artery is seen. Flash filling of the left posterior communicating artery proximally is evident. Also demonstrated is a proximal 25-50% stenosis of the non dominant left vertebrobasilar junction just distal to the origin of the left posterior-inferior cerebellar artery. Both the posterior-inferior cerebellar arteries demonstrate focal areas of caliber irregularity with narrowing likely representative of intracranial arteriosclerosis. The left common carotid arteriogram demonstrates complete angiographic occlusion of the left common carotid artery in its mid to its distal third associated with significant arteriosclerotic calcification. No distal reconstitution of the left internal carotid artery or of the left external carotid artery is seen. The right common carotid arteriogram demonstrates wide patency of the right common carotid artery proximally and also in the proximal stented segment. The right internal carotid artery proximally within the stented segment and distally is widely patent. Patency is maintained of the petrous, the cavernous and the supraclinoid segments with mild arteriosclerotic narrowing of the horizontal segment of the petrous portion. The right middle and the right anterior cerebral artery opacify into the capillary and  venous phases. Prompt cross-filling via the anterior communicating artery of the left anterior cerebral artery A1 and A2 segments, and also the left middle cerebral artery is seen. The superior division of the left middle cerebral artery demonstrates hypoattenuation in its proximal segment which may represent  unopacified blood filling retrogradely from the left internal carotid artery as described above versus nonobstructive arteriosclerotic disease. Overall caliber of the left middle cerebral artery appears narrower than its counter part on the right side. The left subclavian arteriogram demonstrates patency of the thyrocervical trunk branches. There is suggestion of partial reconstitution of the left vertebral artery from collaterals arising from the ascending cervical branch of the thyrocervical trunk at the level of C3-C4. 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 Retrograde opacification of the left external carotid artery and the left internal carotid artery from the posterior circulation from the right vertebral artery and retrogradely the left vertebral artery via the left occipital artery. Occluded right external carotid artery at its origin. Cross-filling of the left anterior cerebral artery and the left middle cerebral artery from the right ICA via the anterior communicating artery, and partially from the posterior circulation as described above. Occluded left posterior cerebral artery just distal to its origin. PLAN: Findings reviewed with the patient and referring neurologist. Follow-up ultrasound of the carotids in 6 months. Electronically Signed   By: Luanne Bras M.D.   On: 08/04/2020 10:45   CT CHEST ABDOMEN PELVIS W CONTRAST  Result Date: 08/17/2020 CLINICAL DATA:  Evaluate metastatic disease EXAM: CT CHEST, ABDOMEN, AND PELVIS WITH CONTRAST TECHNIQUE: Multidetector CT imaging of the chest, abdomen and pelvis was performed following the standard protocol during bolus administration of intravenous contrast. CONTRAST:  123mL OMNIPAQUE IOHEXOL 300 MG/ML  SOLN COMPARISON:  CT of the chest from the previous day FINDINGS: CT CHEST FINDINGS Cardiovascular: Thoracic aorta and its branches demonstrate atherosclerotic calcification. No aneurysmal  dilatation is seen. 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 Pulmonary artery shows a normal branching pattern. No definitive filling defect to suggest pulmonary embolism is noted. Mediastinum/Nodes: Thoracic inlet shows no acute abnormality. Sizable mediastinal adenopathy is noted. No left hilar adenopathy is seen. There is fullness in the right hilum with what appear to be small lymph nodes as well as considerable soft tissue surrounding the middle lobe bronchus centrally. Possibility of underlying mass deserves consideration. The esophagus is within normal limits. Lungs/Pleura: Lungs are again well aerated with diffuse emphysematous changes. There are multiple parenchymal nodules again identified scattered throughout both lungs similar to that seen on the previous day. Largest of these lies medially in the left upper lobe with central decreased attenuation consistent with necrosis. The overall appearance is similar to that noted on the prior study and again suspicious for underlying neoplasm. The fullness in the right hilum surrounding the right middle lobe bronchus is also suspicious for underlying neoplasm. No sizable effusion is seen. No other focal abnormality is noted. Musculoskeletal: Degenerative changes of the thoracic spine are noted. No rib abnormality is noted. Old healed rib fractures on the right are seen. No compression deformity is noted. CT ABDOMEN PELVIS FINDINGS Hepatobiliary: The liver demonstrates a large geographic area of decreased attenuation with peripheral enhancement predominately within the left lobe of the liver. Scattered smaller lesions are noted scattered throughout the liver. These changes are again consistent with metastatic disease. Tissue sampling may be helpful for further evaluation.  The gallbladder is within normal limits. Pancreas: Unremarkable. No pancreatic ductal dilatation or surrounding  inflammatory changes. Spleen: Spleen is mildly prominent although no focal mass is seen. Adrenals/Urinary Tract: The left adrenal gland is within normal limits. Right adrenal gland demonstrates a focal 3.7 by 2.4 cm soft tissue mass lesion similar to that seen prior exam this is stable from prior studies and consistent with a adrenal adenoma. Kidneys demonstrate a normal enhancement pattern. Cystic changes are noted bilaterally. No renal calculi or obstructive changes are seen. Delayed images demonstrate normal excretion of contrast. The bladder is well distended. Stomach/Bowel: Scattered diverticular change of the colon is noted without evidence of diverticulitis. No obstructive or inflammatory changes are seen. No mass lesion is noted. The appendix is within normal limits. The small bowel and stomach are unremarkable. Vascular/Lymphatic: Aortic atherosclerosis. No enlarged abdominal or pelvic lymph nodes. Reproductive: Prostate is unremarkable. Other: No abdominal wall hernia or abnormality. No abdominopelvic ascites. Musculoskeletal: Degenerative changes of lumbar spine are noted most prominent at L4-5. IMPRESSION: CT of the chest: Stable parenchymal nodules bilaterally similar to that seen on the CT from the previous day. Better visualized on today's exam is enhancing soft tissue in the right hilum enveloping the right middle lobe bronchus with narrowing suspicious for a combination of adenopathy and possible underlying mass. Bronchoscopic evaluation may be helpful. 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 CT of the abdomen and pelvis: Metastatic disease involving primarily the left lobe of the liver although scattered smaller lesions are seen. This is similar to that seen on the prior exam. This again measures approximately 8 cm in greatest dimension. Tissue sampling may be helpful for further evaluation. Stable right adrenal adenoma. 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 Electronically Signed   By: Inez Catalina M.D.   On: 08/17/2020 20:26   IR US Guide Vasc Access Right  Result Date: 08/05/2020 CLINICAL DATA:  New onset of right visual deficit with right-sided hemiparesis. Abnormal CT angiogram of the head and neck. EXAM: BILATERAL COMMON CAROTID AND INNOMINATE ANGIOGRAPHY COMPARISON:  Diagnostic catheter arteriogram of September 24, 2019 and CT angiogram the head and neck of August 02, 2020. MEDICATIONS: Heparin 2000 units IA. No antibiotic was administered within 1 hour of the procedure. ANESTHESIA/SEDATION: Versed 1 mg IV; Fentanyl 25 mcg IV Moderate Sedation Time:  43 minutes The patient was continuously monitored during the procedure by the interventional radiology nurse under my direct supervision. CONTRAST:  Isovue 300 approximately 65 mL FLUOROSCOPY TIME:  Fluoroscopy Time: 11 minutes 30 seconds (601 mGy). COMPLICATIONS: None immediate. TECHNIQUE: Informed written consent was obtained from the patient after a thorough discussion of the procedural risks, benefits and alternatives. All questions were addressed. Maximal Sterile Barrier Technique was utilized including caps, mask, sterile gowns, sterile gloves, sterile drape, hand hygiene and skin antiseptic. A timeout was performed prior to the initiation of the procedure. The right forearm to the wrist was prepped and draped in the usual sterile manner. The radial artery was identified with ultrasound, and its morphology documented. A dorsal palmar anastomosis was verified to be present. Using a micropuncture set, right radial access was obtained over a 0.018 inch micro guidewire. Over this, a 4/5 French radial sheath was inserted. The micro guidewire, and the obturator were removed. Good aspiration was obtained from the side port of the sheath. A cocktail of 2000 units of heparin, 2.5 mg of verapamil, and 200 mcg of nitroglycerin was then  infused without event. A right radial arteriogram  was obtained. Over a 0.035 inch Roadrunner guidewire, a 5 [place] Simmons 2 diagnostic catheter was advanced to the aortic arch region, and selectively positioned in the right vertebral artery, the right common carotid artery, the left common carotid artery and the left subclavian artery. Following the procedure, hemostasis at the right radial puncture site was achieved with a wrist band. Distal right radial pulse was verified to be present. FINDINGS: The right vertebral artery origin is widely patent. The vessel is seen to opacify to the cranial skull base. Wide patency is seen of the right posterior-inferior cerebellar artery and the right vertebrobasilar junction. There is a mild to moderate stenosis of the proximal basilar artery. More distally the basilar artery, the right posterior cerebral artery, the superior cerebellar arteries and the anterior-inferior cerebellar arteries opacify into the capillary and venous phases. 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 Also noted is retrograde opacification of the left vertebrobasilar junction and more proximally of the distal left vertebral artery. Subsequent retrograde opacification via the left occipital artery of the left external carotid artery and the left internal carotid artery. The left cavernous and supraclinoid segments and subsequently partial opacification of the left middle cerebral artery is seen. Flash filling of the left posterior communicating artery proximally is evident. Also demonstrated is a proximal 25-50% stenosis of the non dominant left vertebrobasilar junction just distal to the origin of the left posterior-inferior cerebellar artery. Both the posterior-inferior cerebellar arteries demonstrate focal areas of caliber irregularity with narrowing likely representative of intracranial arteriosclerosis. The left common carotid arteriogram demonstrates complete angiographic occlusion of the  left common carotid artery in its mid to its distal third associated with significant arteriosclerotic calcification. No distal reconstitution of the left internal carotid artery or of the left external carotid artery is seen. The right common carotid arteriogram demonstrates wide patency of the right common carotid artery proximally and also in the proximal stented segment. The right internal carotid artery proximally within the stented segment and distally is widely patent. Patency is maintained of the petrous, the cavernous and the supraclinoid segments with mild arteriosclerotic narrowing of the horizontal segment of the petrous portion. The right middle and the right anterior cerebral artery opacify into the capillary and venous phases. Prompt cross-filling via the anterior communicating artery of the left anterior cerebral artery A1 and A2 segments, and also the left middle cerebral artery is seen. The superior division of the left middle cerebral artery demonstrates hypoattenuation in its proximal segment which may represent unopacified blood filling retrogradely from the left internal carotid artery as described above versus nonobstructive arteriosclerotic disease. Overall caliber of the left middle cerebral artery appears narrower than its counter part on the right side. The left subclavian arteriogram demonstrates patency of the thyrocervical trunk branches. There is suggestion of partial reconstitution of the left vertebral artery from collaterals arising from the ascending cervical branch of the thyrocervical trunk at the level of C3-C4. 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 Retrograde opacification of the left external carotid artery and the left internal carotid artery from the posterior circulation from the right vertebral artery and retrogradely the left vertebral artery via the left occipital artery. Occluded right external carotid artery at its  origin. Cross-filling of the left anterior cerebral artery and the left middle cerebral artery from the right ICA via the anterior communicating artery, and partially from the  posterior circulation as described above. Occluded left posterior cerebral artery just distal to its origin. PLAN: Findings reviewed with the patient and referring neurologist. Follow-up ultrasound of the carotids in 6 months. Electronically Signed   By: Luanne Bras M.D.   On: 08/04/2020 10:45   CT CEREBRAL PERFUSION W CONTRAST  Result Date: 08/02/2020 CLINICAL DATA:  Code stroke EXAM: CT PERFUSION BRAIN TECHNIQUE: Multiphase CT imaging of the brain was performed following IV bolus contrast injection. Subsequent parametric perfusion maps were calculated using RAPID software. CONTRAST:  151mL OMNIPAQUE IOHEXOL 350 MG/ML SOLN COMPARISON:  Same day noncontrast head CT.  09/07/2019 head CT. FINDINGS: CT Brain Perfusion Findings: CBF (<30%) Volume: 71mL Perfusion (Tmax>6.0s) volume: 36mL Mismatch Volume: 51mL ASPECTS on noncontrast CT Head: 10 at 1:50 p.m. Today. Infarct Core: 33 mL Infarction Location:Left parietooccipital region. IMPRESSION: 33 mL core infarct involving left parietooccipital region with surrounding ischemic penumbra of 45 mL. Electronically Signed   By: Primitivo Gauze M.D.   On: 08/02/2020 13:58   ECHOCARDIOGRAM COMPLETE  Result Date: 08/04/2020    ECHOCARDIOGRAM REPORT   Patient Name:   TORRY ISTRE [facility] Date of Exam: 08/04/2020 Medical Rec #:  [id number]          Height:       66.0 in Accession #:    [id number]         Weight:       170.0 lb Date of Birth:  02-21-1956          BSA:          1.866 m Patient Age:    17 years           BP:           154/84 mmHg Patient Gender: M                  HR:           88 bpm. Exam Location:  Inpatient Procedure: 2D Echo and Intracardiac Opacification Agent Indications:    Stroke 434.91 / I163.9  History:        Patient has prior history of Echocardiogram examinations,  most                 recent 09/08/2019. Carotid Disease and COPD; Risk                 Factors:Hypertension, Dyslipidemia and Current Smoker.  Sonographer:    Darlina Sicilian RDCS Referring Phys: [id number] CHRISTOPHER P DANFORD IMPRESSIONS  1. Left ventricular ejection fraction, by estimation, is 55 to 60%. The left ventricle has normal function. The left ventricle demonstrates regional wall motion abnormalities with possible basal inferior hypokinesis. There is mild left ventricular hypertrophy. Left ventricular diastolic parameters are consistent with Grade I diastolic dysfunction (impaired relaxation).  2. Right ventricular systolic function is normal. The right ventricular size is normal. Tricuspid regurgitation signal is inadequate for assessing PA pressure.  3. The mitral valve is normal in structure. No evidence of mitral valve regurgitation. No evidence of mitral stenosis.  4. The aortic valve is tricuspid. Aortic valve regurgitation is not visualized. No aortic stenosis is present.  5. The inferior vena cava is normal in size with greater than 50% respiratory variability, suggesting right atrial pressure of 3 mmHg.  6. Technically difficult study with poor acoustic windows. FINDINGS  Left Ventricle: Left ventricular ejection fraction, by estimation, is 55 to 60%. The left ventricle has normal function. The left ventricle demonstrates regional wall motion abnormalities.  Definity contrast agent was given IV to delineate the left ventricular endocardial borders. The left ventricular internal cavity size was normal in size. There is mild left ventricular hypertrophy. Left ventricular diastolic parameters are consistent with Grade I diastolic dysfunction (impaired relaxation). Right Ventricle: The right ventricular size is normal. No increase in right ventricular wall thickness. Right ventricular systolic function is normal. Tricuspid regurgitation signal is inadequate for assessing PA pressure. Left Atrium: Left  atrial size was normal in size. Right Atrium: Right atrial size was normal in size. Pericardium: There is no evidence of pericardial effusion. Mitral Valve: The mitral valve is normal in structure. There is mild thickening of the mitral valve leaflet(s). There is moderate calcification of the mitral valve leaflet(s). No evidence of mitral valve regurgitation. No evidence of mitral valve stenosis. Tricuspid Valve: The tricuspid valve is normal in structure. Tricuspid valve regurgitation is not demonstrated. Aortic Valve: The aortic valve is tricuspid. Aortic valve regurgitation is not visualized. No aortic stenosis is present. Pulmonic Valve: The pulmonic valve was normal in structure. Pulmonic valve regurgitation is not visualized. Aorta: The aortic root is normal in size and structure. Venous: The inferior vena cava is normal in size with greater than 50% respiratory variability, suggesting right atrial pressure of 3 mmHg. IAS/Shunts: No atrial level shunt detected by color flow Doppler.  LEFT VENTRICLE PLAX 2D LVIDd:         3.80 cm  Diastology LVIDs:         2.90 cm  LV e' lateral:   6.22 cm/s LV PW:         1.10 cm  LV E/e' lateral: 8.3 LV IVS:        1.40 cm  LV e' medial:    6.57 cm/s LVOT diam:     2.10 cm  LV E/e' medial:  7.9 LV SV:         46 LV SV Index:   25 LVOT Area:     3.46 cm  LEFT ATRIUM             Index LA diam:        2.90 cm 1.55 cm/m LA Vol (A2C):   36.8 ml 19.72 ml/m LA Vol (A4C):   31.5 ml 16.88 ml/m LA Biplane Vol: 36.6 ml 19.61 ml/m  AORTIC VALVE LVOT Vmax:   76.90 cm/s LVOT Vmean:  49.100 cm/s LVOT VTI:    0.133 m  AORTA Ao Root diam: 3.20 cm MITRAL VALVE MV Area (PHT): 3.72 cm    SHUNTS MV Decel Time: 204 msec    Systemic VTI:  0.13 m MV E velocity: 51.90 cm/s  Systemic Diam: 2.10 cm MV A velocity: 86.80 cm/s MV E/A ratio:  0.60 Loralie Champagne MD Electronically signed by Loralie Champagne MD Signature Date/Time: 08/04/2020/4:48:24 PM    Final    CT HEAD CODE STROKE WO  CONTRAST  Result Date: 08/14/2020 CLINICAL DATA:  Code stroke. Right-sided weakness and speech difficulty EXAM: CT HEAD WITHOUT CONTRAST TECHNIQUE: Contiguous axial images were obtained from the base of the skull through the vertex without intravenous contrast. COMPARISON:  08/02/2020 CT.  Brain MRI 08/02/2020 FINDINGS: Brain: Expected evolution of left occipital and frontal parietal convexity infarct which have become more low-density. Large remote right parietal infarct. Remote left perforator infarct at the basal ganglia. Chronic small vessel ischemic gliosis in the white matter. No hemorrhage, hydrocephalus, or masslike finding. Vascular: Negative Skull: Negative Sinuses/Orbits: Chronic right maxillary sinusitis. Other: These results were communicated to [facility]  at 11:58 amon 8/21/2021by text page via the [facility] messaging system. ASPECTS [facility] Stroke Program Early CT Score) Not scored in this setting IMPRESSION: 1. No acute finding when compared with prior. 2. Expected evolution of recent left occipital and border zone infarcts. 3. Remote right parietal infarct. Electronically Signed   By: Monte Fantasia M.D.   On: 08/14/2020 12:00   CT HEAD CODE STROKE WO CONTRAST  Result Date: 08/02/2020 CLINICAL DATA:  Code stroke.  Neuro deficit EXAM: CT HEAD WITHOUT CONTRAST TECHNIQUE: Contiguous axial images were obtained from the base of the skull through the vertex without intravenous contrast. COMPARISON:  09/07/2019 head CT. FINDINGS: Brain: Ill-defined hypodense region involving the left parietooccipital region with blurring of the gray-white interface is concerning for acute infarct (5:55). Sequela of remote right MCA territory and left basal ganglia insults. Background scattered and confluent supratentorial white matter hypodensities are nonspecific however commonly associated with chronic microvascular ischemic changes. No mass lesion. No midline shift, ventriculomegaly or extra-axial fluid collection. Vascular:  No hyperdense vessel. Bilateral skull base atherosclerotic calcifications. Skull: Negative for fracture or focal lesion. Sinuses/Orbits: Normal orbits. Clear paranasal sinuses. No mastoid effusion. Other: None. ASPECTS [facility] Stroke Program Early CT Score) - Ganglionic level infarction (caudate, lentiform nuclei, internal capsule, insula, M1-M3 cortex): 7 - Supraganglionic infarction (M4-M6 cortex): 3 Total score (0-10 with 10 being normal): 10 IMPRESSION: 1. Acute left parietooccipital infarct.  ASPECTS is 10. 2. Sequela of remote right MCA territory and left basal ganglia insults. 3. Chronic microvascular ischemic changes. Code stroke imaging results were communicated on 08/02/2020 at 1:51 pm to provider Dr. Curly Sh[place] Via [employer] secure text paging. Electronically Signed   By: Primitivo Gauze M.D.   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 Abnormal CT angiogram of the head and neck. EXAM: BILATERAL COMMON CAROTID AND INNOMINATE ANGIOGRAPHY COMPARISON:  Diagnostic catheter arteriogram of September 24, 2019 and CT angiogram the head and neck of August 02, 2020. MEDICATIONS: Heparin 2000 units IA. No antibiotic was administered within 1 hour of the procedure. ANESTHESIA/SEDATION: Versed 1 mg IV; Fentanyl 25 mcg IV Moderate Sedation Time:  43 minutes The patient was continuously monitored during the procedure by the interventional radiology nurse under my direct supervision. CONTRAST:  Isovue 300 approximately 65 mL FLUOROSCOPY TIME:  Fluoroscopy Time: 11 minutes 30 seconds (601 mGy). COMPLICATIONS: None immediate. TECHNIQUE: Informed written consent was obtained from the patient after a thorough discussion of the procedural risks, benefits and alternatives. All questions were addressed. Maximal Sterile Barrier Technique was utilized including caps, mask, sterile  gowns, sterile gloves, sterile drape, hand hygiene and skin antiseptic. A timeout was performed prior to the initiation of the procedure. The right forearm to the wrist was prepped and draped in the usual sterile manner. The radial artery was identified with ultrasound, and its morphology documented. A dorsal palmar anastomosis was verified to be present. Using a micropuncture set, right radial access was obtained over a 0.018 inch micro guidewire. Over this, a 4/5 French radial sheath was inserted. The micro guidewire, and the obturator were removed. Good aspiration was obtained from the side port of the sheath. A cocktail of 2000 units of heparin, 2.5 mg of verapamil, and 200 mcg of nitroglycerin was then infused without event. A right radial arteriogram was obtained. Over a 0.035 inch Roadrunner guidewire, a 5 [place] Simmons 2 diagnostic  catheter was advanced to the aortic arch region, and selectively positioned in the right vertebral artery, the right common carotid artery, the left common carotid artery and the left subclavian artery. Following the procedure, hemostasis at the right radial puncture site was achieved with a wrist band. Distal right radial pulse was verified to be present. FINDINGS: The right vertebral artery origin is widely patent. The vessel is seen to opacify to the cranial skull base. Wide patency is seen of the right posterior-inferior cerebellar artery and the right vertebrobasilar junction. There is a mild to moderate stenosis of the proximal basilar artery. More distally the basilar artery, the right posterior cerebral artery, the superior cerebellar arteries and the anterior-inferior cerebellar arteries opacify into the capillary and venous phases. 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 Also noted is retrograde opacification of the left vertebrobasilar junction and more proximally of the distal left vertebral artery. Subsequent  retrograde opacification via the left occipital artery of the left external carotid artery and the left internal carotid artery. The left cavernous and supraclinoid segments and subsequently partial opacification of the left middle cerebral artery is seen. Flash filling of the left posterior communicating artery proximally is evident. Also demonstrated is a proximal 25-50% stenosis of the non dominant left vertebrobasilar junction just distal to the origin of the left posterior-inferior cerebellar artery. Both the posterior-inferior cerebellar arteries demonstrate focal areas of caliber irregularity with narrowing likely representative of intracranial arteriosclerosis. The left common carotid arteriogram demonstrates complete angiographic occlusion of the left common carotid artery in its mid to its distal third associated with significant arteriosclerotic calcification. No distal reconstitution of the left internal carotid artery or of the left external carotid artery is seen. The right common carotid arteriogram demonstrates wide patency of the right common carotid artery proximally and also in the proximal stented segment. The right internal carotid artery proximally within the stented segment and distally is widely patent. Patency is maintained of the petrous, the cavernous and the supraclinoid segments with mild arteriosclerotic narrowing of the horizontal segment of the petrous portion. The right middle and the right anterior cerebral artery opacify into the capillary and venous phases. Prompt cross-filling via the anterior communicating artery of the left anterior cerebral artery A1 and A2 segments, and also the left middle cerebral artery is seen. The superior division of the left middle cerebral artery demonstrates hypoattenuation in its proximal segment which may represent unopacified blood filling retrogradely from the left internal carotid artery as described above versus nonobstructive arteriosclerotic  disease. Overall caliber of the left middle cerebral artery appears narrower than its counter part on the right side. The left subclavian arteriogram demonstrates patency of the thyrocervical trunk branches. There is suggestion of partial reconstitution of the left vertebral artery from collaterals arising from the ascending cervical branch of the thyrocervical trunk at the level of C3-C4. 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 Retrograde opacification of the left external carotid artery and the left internal carotid artery from the posterior circulation from the right vertebral artery and retrogradely the left vertebral artery via the left occipital artery. Occluded right external carotid artery at its origin. Cross-filling of the left anterior cerebral artery and the left middle cerebral artery from the right ICA via the anterior communicating artery, and partially from the posterior circulation as described above. Occluded left posterior cerebral artery just distal to its origin. PLAN: Findings reviewed with the patient and  referring neurologist. Follow-up ultrasound of the carotids in 6 months. Electronically Signed   By: Luanne Bras M.D.   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 Abnormal CT angiogram of the head and neck. EXAM: BILATERAL COMMON CAROTID AND INNOMINATE ANGIOGRAPHY COMPARISON:  Diagnostic catheter arteriogram of September 24, 2019 and CT angiogram the head and neck of August 02, 2020. MEDICATIONS: Heparin 2000 units IA. No antibiotic was administered within 1 hour of the procedure. ANESTHESIA/SEDATION: Versed 1 mg IV; Fentanyl 25 mcg IV Moderate Sedation Time:  43 minutes The patient was continuously monitored during the procedure by the interventional radiology nurse under my direct supervision.  CONTRAST:  Isovue 300 approximately 65 mL FLUOROSCOPY TIME:  Fluoroscopy Time: 11 minutes 30 seconds (601 mGy). COMPLICATIONS: None immediate. TECHNIQUE: Informed written consent was obtained from the patient after a thorough discussion of the procedural risks, benefits and alternatives. All questions were addressed. Maximal Sterile Barrier Technique was utilized including caps, mask, sterile gowns, sterile gloves, sterile drape, hand hygiene and skin antiseptic. A timeout was performed prior to the initiation of the procedure. The right forearm to the wrist was prepped and draped in the usual sterile manner. The radial artery was identified with ultrasound, and its morphology documented. A dorsal palmar anastomosis was verified to be present. Using a micropuncture set, right radial access was obtained over a 0.018 inch micro guidewire. Over this, a 4/5 French radial sheath was inserted. The micro guidewire, and the obturator were removed. Good aspiration was obtained from the side port of the sheath. A cocktail of 2000 units of heparin, 2.5 mg of verapamil, and 200 mcg of nitroglycerin was then infused without event. A right radial arteriogram was obtained. Over a 0.035 inch Roadrunner guidewire, a 5 [place] Simmons 2 diagnostic catheter was advanced to the aortic arch region, and selectively positioned in the right vertebral artery, the right common carotid artery, the left common carotid artery and the left subclavian artery. Following the procedure, hemostasis at the right radial puncture site was achieved with a wrist band. Distal right radial pulse was verified to be present. FINDINGS: The right vertebral artery origin is widely patent. The vessel is seen to opacify to the cranial skull base. Wide patency is seen of the right posterior-inferior cerebellar artery and the right vertebrobasilar junction. There is a mild to moderate stenosis of the proximal basilar artery. More distally the basilar artery, the  right posterior cerebral artery, the superior cerebellar arteries and the anterior-inferior cerebellar arteries opacify into the capillary and venous phases. 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 Also noted is retrograde opacification of the left vertebrobasilar junction and more proximally of the distal left vertebral artery. Subsequent retrograde opacification via the left occipital artery of the left external carotid artery and the left internal carotid artery. The left cavernous and supraclinoid segments and subsequently partial opacification of the left middle cerebral artery is seen. Flash filling of the left posterior communicating artery proximally is evident. Also demonstrated is a proximal 25-50% stenosis of the non dominant left vertebrobasilar junction just distal to the origin of the left posterior-inferior cerebellar artery. Both the posterior-inferior cerebellar arteries demonstrate focal areas of caliber irregularity with narrowing likely representative of intracranial arteriosclerosis. The left common carotid arteriogram demonstrates complete angiographic occlusion of the left common carotid artery in its  mid to its distal third associated with significant arteriosclerotic calcification. No distal reconstitution of the left internal carotid artery or of the left external carotid artery is seen. The right common carotid arteriogram demonstrates wide patency of the right common carotid artery proximally and also in the proximal stented segment. The right internal carotid artery proximally within the stented segment and distally is widely patent. Patency is maintained of the petrous, the cavernous and the supraclinoid segments with mild arteriosclerotic narrowing of the horizontal segment of the petrous portion. The right middle and the right anterior cerebral artery opacify into the capillary and venous phases. Prompt cross-filling via the  anterior communicating artery of the left anterior cerebral artery A1 and A2 segments, and also the left middle cerebral artery is seen. The superior division of the left middle cerebral artery demonstrates hypoattenuation in its proximal segment which may represent unopacified blood filling retrogradely from the left internal carotid artery as described above versus nonobstructive arteriosclerotic disease. Overall caliber of the left middle cerebral artery appears narrower than its counter part on the right side. The left subclavian arteriogram demonstrates patency of the thyrocervical trunk branches. There is suggestion of partial reconstitution of the left vertebral artery from collaterals arising from the ascending cervical branch of the thyrocervical trunk at the level of C3-C4. 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 Retrograde opacification of the left external carotid artery and the left internal carotid artery from the posterior circulation from the right vertebral artery and retrogradely the left vertebral artery via the left occipital artery. Occluded right external carotid artery at its origin. Cross-filling of the left anterior cerebral artery and the left middle cerebral artery from the right ICA via the anterior communicating artery, and partially from the posterior circulation as described above. Occluded left posterior cerebral artery just distal to its origin. PLAN: Findings reviewed with the patient and referring neurologist. Follow-up ultrasound of the carotids in 6 months. Electronically Signed   By: Luanne Bras M.D.   On: 08/04/2020 10:45    Labs:  Basic Metabolic Panel: Recent Labs  Lab 08/14/20 1127 08/15/20 0507 08/16/20 0529 08/17/20 1112 08/18/20 0609  NA 139 136 138 139 137  K 3.7 3.6 3.6 3.8 3.6  CL 104 106 106 108 106  CO2 22 23 23  21* 23  GLUCOSE 153* 111* 103* 113* 117*  BUN 14 8 11 10 8   CREATININE 0.71 0.60*  0.62 0.66 0.57*  CALCIUM 8.9 8.3* 8.2* 8.2* 8.1*    CBC: Recent Labs  Lab 08/17/20 1625 08/18/20 0609 08/18/20 1623  WBC 6.7 6.3 7.1  NEUTROABS 5.8 4.9 6.0  HGB 8.0* 7.6* 7.8*  HCT 28.1* 26.7* 26.2*  MCV 81.0 79.5* 79.9*  PLT 238 216 223    CBG: No results for input(s): GLUCAP in the last 168 hours.  Family history.  Mother with cancer.  Denies any hypertension hyperlipidemia or esophageal cancer  Brief HPI:   Nathan Hart is a 64 y.o. right-handed male with history of hypertension, CVA, right carotid stenting September 2020, COPD with home oxygen legally blind bilaterally smoker with alcohol use as well as questionable medical noncompliance.  Patient lives with son and family.  Presented 08/02/2020 with right side weakness numbness and hemiparesis.  Cranial CT scan showed acute left parieto-occipital sequela of remote right MCA territory left basal ganglia insult.  CT angiogram of head and neck occlusion of the proximal left common carotid artery shortly after his takeoff to the  level of the bifurcation.  Right ICA stent was patent.  Patient did not receive TPA.  MRI acute infarct left posterior greater than anterior circulation noted presence of fetal left PCA on the prior CTA no significant mass-effect.  There was a minor petechial hemorrhage.  Admission chemistries glucose 131 hemoglobin 11 alcohol negative urine drug screen negative urinalysis negative nitrite.  Currently maintained on aspirin and Plavix for CVA prophylaxis x3 months and aspirin alone.  Subcutaneous Lovenox for DVT prophylaxis.  Tolerating a regular diet.  Therapy evaluations completed and patient was admitted for a comprehensive rehab program.   Hospital Course: JAQUEZ FARRINGTON was admitted to rehab 08/05/2020 for inpatient therapies to consist of PT, ST and OT at least three hours five days a week. Past admission physiatrist, therapy team and rehab RN have worked together to provide customized collaborative  inpatient rehab.  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  He remained on aspirin and Plavix x3 months then aspirin alone followed by neurology services.  Subcutaneous Lovenox for DVT prophylaxis.  Hospital course complicated by anemia as well as some worsening of right side weakness and slurred speech.  Neurology did follow-up 08/14/2020 with cranial CT scan negative for acute findings.  Expected evolution of recent left occipital and border zone infarct.  Remote right parietal infarct.  CT angiogram of head and neck no emergent vascular findings no change from CTA from earlier in the month.  There was noted pulmonary nodules in the apical lungs including a 15 mm left apical subpleural nodule appeared to invade the pleura and full discussion was held with family in regards to biopsy CT abdomen pelvis family had at this time refused biopsy and requested transition to palliative care.  Patient was transfused 2 units of packed red blood cells and placed on low-dose ProAmatine monitoring of blood pressure.  Patient received follow-up cranial CT scan 08/18/2020 due to increasing right side weakness limited verbal output showing subacute infarct left occipital posterior lateral temporal lobe with mild persistent swelling but no evidence of hemorrhagic transformation.  Small acute infarcts.  Films were reviewed by neurology services and in light of recent bleeding his Plavix was discontinued and remained only on aspirin if bleeding was to maintain and stabilize his Plavix could be later resumed.  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  He did have a history of  COPD oxygen dependent.  Patient legally blind has assistance at home from his son.  History of alcohol tobacco abuse counseling provided with patient and family.  Lipitor ongoing for hyperlipidemia.  There was some question of medical noncompliance again discussed with son and patient.   Blood pressures were monitored on TID basis and soft and monitored     Rehab course: During patient's stay in rehab weekly team conferences were held to monitor patient's progress, set goals and discuss barriers to discharge. At admission, patient required minimal guard sit to stand minimal guard stand pivot transfers.  Moderate assist upper body bathing moderate assist lower body bathing moderate assist upper body dressing mod assist lower body dressing  Physical exam.  Blood pressure 141/82 pulse 91 temperature 98 respirations 92% room air Constitutional.  Supine in bed answer basic questions HEENT Head.  Normocephalic and atraumatic Eyes.  Pupils pinpoint patient bilaterally legally blind Neck.  Supple nontender no JVD without thyromegaly Cardiac regular rate rhythm without any extra sounds or murmur heard Respiratory effort normal no respiratory distress without wheeze Abdomen.  Soft nontender positive bowel sounds without rebound Neurological.  Patient is alert somewhat anxious provides his name and place but needed multiple cues for age and time.  He did follow simple commands but limited awareness of deficits. Left upper extremity 5/5 deltoids biceps triceps, WR less than grip and finger abduction.  Right upper extremity 4/5 in deltoids biceps triceps 3+/5 grip 1/5 finger abduction Left lower extremity 5/5 in hip flexors knee extension knee flexion dorsi plantar flexion Right lower extremity 4/5 hip flexors knee extension knee flexion dorsi plantar flexion  He had slow overall progress due to recurrent CVA multimedical.  Demonstrates deficits in muscle weakness impaired timing sequencing on balance  muscle activation decreased visual perceptual skills.  He required max assist for bed mobility perform supine to sit max assist.  Family teaching completed patient was transition to hospice care at home       Disposition: Discharged home    Diet: Clear DYSPHAGIA 2 thin liquids  Special Instructions: No driving smoking or alcohol    Medications at discharge 1.  Tylenol as needed 2.  Aspirin 325 mg p.o. daily 3.  Lipitor 80 mg p.o. daily 4.  Folic acid 1 mg p.o. daily 5.  ProAmatine 2.5 mg p.o. 3 times daily 6.  Protonix 40 mg twice daily  30-35 minutes were spent completing discharge summary and discharge planning  Discharge Instructions    Ambulatory referral to Physical Medicine Rehab   Complete by: As directed    Moderate complexity follow-up 1 to 2 weeks left posterior MCA/left PCA infarction       Follow-up Information    Jamse Arn, MD Follow up.   Specialty: Physical Medicine and Rehabilitation Why: Office to call for appointment Contact information: [address] [place] [address] [phone]        Arta Silence, MD Follow up.   Specialty: Gastroenterology Why: call for appointment Contact information: [address] [place] [address] [phone]        Curt Bears, MD Follow up.   Specialty: Oncology Why: call for appointment Contact information: [address] [place] [address] [phone]        Micheline Rough, MD Follow up.   Specialty: Family Medicine Why: as needed Contact information: [place] [address] [phone]               Signed: Cathlyn Parsons 08/19/2020, 8:27 AM

## 2020-08-15 NOTE — Progress Notes (Addendum)
[facility] PHYSICAL MEDICINE & REHABILITATION PROGRESS NOTE  Subjective/Complaints: Pt had a reasonable night. Able to sleep. Ate some dinner, drank. Denies pain this morning.   ROS: limited due to language/communication   Objective: Vital Signs: Blood pressure 106/71, pulse 98, temperature 97.6 F (36.4 C), temperature source Oral, resp. rate 20, SpO2 100 %. CT ANGIO HEAD W OR WO CONTRAST  Result Date: 08/14/2020 CLINICAL DATA:  Code stroke with right-sided weakness. EXAM: CT ANGIOGRAPHY HEAD AND NECK TECHNIQUE: Multidetector CT imaging of the head and neck was performed using the standard protocol during bolus administration of intravenous contrast. Multiplanar CT image reconstructions and MIPs were obtained to evaluate the vascular anatomy. Carotid stenosis measurements (when applicable) are obtained utilizing NASCET criteria, using the distal internal carotid diameter as the denominator. CONTRAST:  28mL OMNIPAQUE IOHEXOL 350 MG/ML SOLN COMPARISON:  08/02/2020 FINDINGS: CTA NECK FINDINGS Aortic arch: Atheromatous plaque. Right carotid system: Atheromatous wall thickening diffusely in the common carotid and ICA. There is a stent spanning the bifurcation which is patent. No downstream beading or dissection Left carotid system: Chronic occlusion beginning just after the common carotid origin with irregular calcified plaque. There is reconstitution at the bifurcation with narrowing less intensely enhancing left ICA that is patent into the skull. Vertebral arteries: Atheromatous plaque in the bilateral subclavian artery. Dominant right vertebral artery which is widely patent to the dura. No flow seen at the left V1 segment, but there is reconstitution and flow seen by the basilar. Skeleton: Advanced cervical spine degeneration. No acute or aggressive finding. Other neck: No acute finding. Upper chest: Emphysema. Scattered pulmonary nodules, most notably at nodule measuring 15 mm at the left apex which  appears to invade the adjacent pleura. Review of the MIP images confirms the above findings CTA HEAD FINDINGS Anterior circulation: Heavily calcified bilateral carotid siphons and petrous segments. The segments are recently evaluated by catheter angiography which is better due to the degree of artifact from skull base and calcified plaque. No emergent branch occlusion or aneurysm. Posterior circulation: Right vertebral artery dominance. Left V4 segment calcified plaque. Basilar artery is widely patent. Fetal type left PCA flow, which may be acquired. Venous sinuses: Diffusely patent Anatomic variants: As above Review of the MIP images confirms the above findings These results were communicated to Dr Erlinda Hong at 12:47 pmon 8/21/2021by text page via the [facility] messaging system. IMPRESSION: 1. No emergent vascular finding. No change from CTA and catheter angiogram earlier this month. 2. 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 3. Diffusely patent right cervical carotid stent. 4. Pulmonary nodules in the apical lungs including a 15 mm left apical subpleural nodule which appears to invade the pleura, primarily worrisome for malignancy/metastatic disease. Recommend dedicated chest CT. Electronically Signed   By: Monte Fantasia M.D.   On: 08/14/2020 12:49   CT ANGIO NECK W OR WO CONTRAST  Result Date: 08/14/2020 CLINICAL DATA:  Code stroke with right-sided weakness. EXAM: CT ANGIOGRAPHY HEAD AND NECK TECHNIQUE: Multidetector CT imaging of the head and neck was performed using the standard protocol during bolus administration of intravenous contrast. Multiplanar CT image reconstructions and MIPs were obtained to evaluate the vascular anatomy. Carotid stenosis measurements (when applicable) are obtained utilizing NASCET criteria, using the distal internal carotid diameter as the denominator. CONTRAST:  18mL OMNIPAQUE IOHEXOL 350 MG/ML SOLN COMPARISON:  08/02/2020 FINDINGS: CTA NECK FINDINGS  Aortic arch: Atheromatous plaque. Right carotid system: Atheromatous wall thickening diffusely in the common carotid and ICA. There is a  stent spanning the bifurcation which is patent. No downstream beading or dissection Left carotid system: Chronic occlusion beginning just after the common carotid origin with irregular calcified plaque. There is reconstitution at the bifurcation with narrowing less intensely enhancing left ICA that is patent into the skull. Vertebral arteries: Atheromatous plaque in the bilateral subclavian artery. Dominant right vertebral artery which is widely patent to the dura. No flow seen at the left V1 segment, but there is reconstitution and flow seen by the basilar. Skeleton: Advanced cervical spine degeneration. No acute or aggressive finding. Other neck: No acute finding. Upper chest: Emphysema. Scattered pulmonary nodules, most notably at nodule measuring 15 mm at the left apex which appears to invade the adjacent pleura. Review of the MIP images confirms the above findings CTA HEAD FINDINGS Anterior circulation: Heavily calcified bilateral carotid siphons and petrous segments. The segments are recently evaluated by catheter angiography which is better due to the degree of artifact from skull base and calcified plaque. No emergent branch occlusion or aneurysm. Posterior circulation: Right vertebral artery dominance. Left V4 segment calcified plaque. Basilar artery is widely patent. Fetal type left PCA flow, which may be acquired. Venous sinuses: Diffusely patent Anatomic variants: As above Review of the MIP images confirms the above findings These results were communicated to Dr Erlinda Hong at 12:47 pmon 8/21/2021by text page via the [facility] messaging system. IMPRESSION: 1. No emergent vascular finding. No change from CTA and catheter angiogram earlier this month. 2. 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 3. Diffusely patent right cervical carotid stent. 4.  Pulmonary nodules in the apical lungs including a 15 mm left apical subpleural nodule which appears to invade the pleura, primarily worrisome for malignancy/metastatic disease. Recommend dedicated chest CT. Electronically Signed   By: Monte Fantasia M.D.   On: 08/14/2020 12:49   CT HEAD CODE STROKE WO CONTRAST  Result Date: 08/14/2020 CLINICAL DATA:  Code stroke. Right-sided weakness and speech difficulty EXAM: CT HEAD WITHOUT CONTRAST TECHNIQUE: Contiguous axial images were obtained from the base of the skull through the vertex without intravenous contrast. COMPARISON:  08/02/2020 CT.  Brain MRI 08/02/2020 FINDINGS: Brain: Expected evolution of left occipital and frontal parietal convexity infarct which have become more low-density. Large remote right parietal infarct. Remote left perforator infarct at the basal ganglia. Chronic small vessel ischemic gliosis in the white matter. No hemorrhage, hydrocephalus, or masslike finding. Vascular: Negative Skull: Negative Sinuses/Orbits: Chronic right maxillary sinusitis. Other: These results were communicated to Xu at 11:58 amon 8/21/2021by text page via the [facility] messaging system. ASPECTS [facility] Stroke Program Early CT Score) Not scored in this setting IMPRESSION: 1. No acute finding when compared with prior. 2. Expected evolution of recent left occipital and border zone infarcts. 3. Remote right parietal infarct. Electronically Signed   By: Monte Fantasia M.D.   On: 08/14/2020 12:00   Recent Labs    08/14/20 1127 08/15/20 0507  WBC 7.0 5.9  HGB 8.0* 7.0*  HCT 27.6* 24.9*  PLT 259 236   Recent Labs    08/14/20 1127 08/15/20 0507  NA 139 136  K 3.7 3.6  CL 104 106  CO2 22 23  GLUCOSE 153* 111*  BUN 14 8  CREATININE 0.71 0.60*  CALCIUM 8.9 8.3*    Physical Exam: BP 106/71 (BP Location: Left Arm)   Pulse 98   Temp 97.6 F (36.4 C) (Oral)   Resp 20   SpO2 100%  Constitutional: No distress . Vital signs reviewed. HEENT:  EOMI, oral  membranes moist Neck: supple Cardiovascular: RRR without murmur. No JVD    Respiratory/Chest: CTA Bilaterally without wheezes or rales. Normal effort    GI/Abdomen: BS +, non-tender, non-distended Ext: no clubbing, cyanosis, or edema Psych: flat but cooperative Musc: No edema in extremities.  No tenderness in extremities. Neuro: Alert, following commands. Still with word finding deficits. Unable to ID basic items visually or when given verbal cues. Motor:  LUE: 4-5/5 proximal distal, stable RUE: 3 to 3+/5 shoulder abduction, elbow flexion/extension, handgrip, finger extension 1-2/5, improved today LLE: 4-5/5 proximal distal RLE: 3-4/5 proximal and distal--improved from yesterday  Assessment/Plan: 1. Functional deficits secondary to left posterior MCA/PCA infarcts which require 3+ hours per day of interdisciplinary therapy in a comprehensive inpatient rehab setting.  Physiatrist is providing close team supervision and 24 hour management of active medical problems listed below.  Physiatrist and rehab team continue to assess barriers to discharge/monitor patient progress toward functional and medical goals  Care Tool:  Bathing    Body parts bathed by patient: Right arm, Chest, Abdomen, Front perineal area, Face   Body parts bathed by helper: Left arm, Buttocks, Right upper leg, Left upper leg, Right lower leg, Left lower leg Body parts n/a: Buttocks   Bathing assist Assist Level: Moderate Assistance - Patient 50 - 74%     Upper Body Dressing/Undressing Upper body dressing Upper body dressing/undressing activity did not occur (including orthotics): N/A What is the patient wearing?: Pull over shirt    Upper body assist Assist Level: Minimal Assistance - Patient > 75%    Lower Body Dressing/Undressing Lower body dressing    Lower body dressing activity did not occur: N/A What is the patient wearing?: Pants     Lower body assist Assist for lower body dressing: Moderate  Assistance - Patient 50 - 74%     Toileting Toileting Toileting Activity did not occur Landscape architect and hygiene only): N/A (no void or bm)  Toileting assist Assist for toileting: Minimal Assistance - Patient > 75% (urinal placement)     Transfers Chair/bed transfer  Transfers assist     Chair/bed transfer assist level: Minimal Assistance - Patient > 75%     Locomotion Ambulation   Ambulation assist      Assist level: Minimal Assistance - Patient > 75% Assistive device: Walker-rolling Max distance: 110   Walk 10 feet activity   Assist     Assist level: Minimal Assistance - Patient > 75% Assistive device: Walker-rolling   Walk 50 feet activity   Assist    Assist level: Minimal Assistance - Patient > 75% Assistive device: Walker-rolling    Walk 150 feet activity   Assist Walk 150 feet activity did not occur: Safety/medical concerns         Walk 10 feet on uneven surface  activity   Assist     Assist level: Minimal Assistance - Patient > 75% Assistive device: Hand held assist   Wheelchair     Assist Will patient use wheelchair at discharge?: No             Wheelchair 50 feet with 2 turns activity    Assist            Wheelchair 150 feet activity     Assist           BP 106/71 (BP Location: Left Arm)   Pulse 98   Temp 97.6 F (36.4 C) (Oral)   Resp 20   SpO2 100%  Medical Problem List and Plan: 1.  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  -pt s/p code stroke. Head CT, CTA do not demonstrate new event or occlusion. Sx d/t hypoperfusion given orthostasis. Pt still not back to neuro baseline however.  Hgb down to 7.0 today likely related to volume resuscitation.    -neuro following   -midodrine trial to prop up bp   -continue IVF   -will transfuse  2u prbc d/t symptomatic anemia   -keep on bedrest until bp more robust.     -diet resumed 2.  Antithrombotics: -DVT/anticoagulation: Lovenox             -antiplatelet therapy: Aspirin 325 mg daily and Plavix 75 mg daily x3 months then aspirin alone 3. Pain Management: Tylenol as needed.  4. Mood/dementia: Provide emotional support             -antipsychotic agents: N/A  -team providing ego support  5. Neuropsych/dementia: This patient is  not capable of making decisions on his own behalf. 6. Skin/Wound Care: Routine skin checks 7. Fluids/Electrolytes/Nutrition: Routine in and outs. 8.  Hypertension with orthostasis.     coreg  6.25mg  BID---held d/t #1    9.  COPD.  Oxygen dependent.  Check oxygen saturations every shift  8/22 Pulmonary nodule seen on CTA in left apex, 27mm   -I discussed finding with son [name] today   -primary team can discuss further with pt/family this week.   -?onc/pulmonary consult 10.  Legally blind bilaterally.  Patient has assistance of son at home- due to macular degeneration 11.  History of tobacco and alcohol use.  Provide counseling 12.  Hyperlipidemia.    Continue Lipitor 13.  Questionable medical compliance.  Provide counseling with patient and family. 14.?  Acute blood loss anemia  Hemoglobin 7.6 on 8/20-severe drop, now 7.0 today after IVF  -heme check stool (still not done)  -transfuse 2u as above  -may be associated with nodule above  -continue lovenox fornow  Greater than 35 total minutes was spent in examination of patient, assessment of pertinent data,  formulation of a treatment plan, and in discussion with patient and/or family.     LOS: 10 days A FACE TO FACE EVALUATION WAS PERFORMED  Meredith Staggers 08/15/2020, 10:21 AM

## 2020-08-15 NOTE — Progress Notes (Signed)
STROKE TEAM PROGRESS NOTE   INTERVAL HISTORY No family is at the bedside.  Pt lying in bed, still has orthostatic hypotension. On best rest, started on midodrine. He also has severe anemia with worsening Hb, 7.0 today. Will received PRBC. However, his neuro condition seems back to baseline now. Still has right UE pronator drift but RLE symmetrical to the LLE.    OBJECTIVE Vitals:   08/15/20 0418 08/15/20 0815 08/15/20 0820 08/15/20 0825  BP: 130/69  135/76 106/71  Pulse: 86 85 87 98  Resp:  20 18 20   Temp:  98.5 F (36.9 C) 97.6 F (36.4 C) 97.6 F (36.4 C)  TempSrc:  Oral Oral Oral  SpO2: 99%  100% 100%    CBC:  Recent Labs  Lab 08/13/20 0840 08/13/20 1237 08/14/20 1127 08/15/20 0507  WBC 5.8   < > 7.0 5.9  NEUTROABS 4.0  --   --   --   HGB 7.6*   < > 8.0* 7.0*  HCT 27.1*   < > 27.6* 24.9*  MCV 75.3*   < > 74.4* 74.8*  PLT 228   < > 259 236   < > = values in this interval not displayed.    Basic Metabolic Panel:  Recent Labs  Lab 08/14/20 1127 08/15/20 0507  NA 139 136  K 3.7 3.6  CL 104 106  CO2 22 23  GLUCOSE 153* 111*  BUN 14 8  CREATININE 0.71 0.60*  CALCIUM 8.9 8.3*    Lipid Panel:     Component Value Date/Time   CHOL 102 08/03/2020 0624   TRIG 98 08/03/2020 0624   HDL 35 (L) 08/03/2020 0624   CHOLHDL 2.9 08/03/2020 0624   VLDL 20 08/03/2020 0624   LDLCALC 47 08/03/2020 0624   HgbA1c:  Lab Results  Component Value Date   HGBA1C 6.0 (H) 08/03/2020   Urine Drug Screen:     Component Value Date/Time   LABOPIA NONE DETECTED 08/02/2020 2015   COCAINSCRNUR NONE DETECTED 08/02/2020 2015   LABBENZ NONE DETECTED 08/02/2020 2015   AMPHETMU NONE DETECTED 08/02/2020 2015   THCU NONE DETECTED 08/02/2020 2015   LABBARB NONE DETECTED 08/02/2020 2015    Alcohol Level     Component Value Date/Time   ETH <10 08/02/2020 2015    IMAGING  CT ANGIO HEAD W OR WO CONTRAST CT ANGIO NECK W OR WO CONTRAST 08/14/2020 IMPRESSION:  1. No emergent  vascular finding. No change from CTA and catheter angiogram earlier this month.  2. 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  3. Diffusely patent right cervical carotid stent.  4. Pulmonary nodules in the apical lungs including a 15 mm left apical subpleural nodule which appears to invade the pleura, primarily worrisome for malignancy/metastatic disease. Recommend dedicated chest CT.   CT HEAD CODE STROKE WO CONTRAST 08/14/2020 IMPRESSION:  1. No acute finding when compared with prior.  2. Expected evolution of recent left occipital and border zone infarcts.  3. Remote right parietal infarct.   Transthoracic Echocardiogram  08/04/2020 IMPRESSIONS  1. Left ventricular ejection fraction, by estimation, is 55 to 60%. The  left ventricle has normal function. The left ventricle demonstrates  regional wall motion abnormalities with possible basal inferior  hypokinesis. There is mild left ventricular  hypertrophy. Left ventricular diastolic parameters are consistent with  Grade I diastolic dysfunction (impaired relaxation).  2. Right ventricular systolic function is normal. The right ventricular  size is normal. Tricuspid regurgitation signal is inadequate  for assessing  PA pressure.  3. The mitral valve is normal in structure. No evidence of mitral valve  regurgitation. No evidence of mitral stenosis.  4. The aortic valve is tricuspid. Aortic valve regurgitation is not  visualized. No aortic stenosis is present.  5. The inferior vena cava is normal in size with greater than 50%  respiratory variability, suggesting right atrial pressure of 3 mmHg.  6. Technically difficult study with poor acoustic windows.   ECG - 08/14/20 - SR rate 79 BPM. (See cardiology reading for complete details)  PHYSICAL EXAM  Temp:  [97.4 F (36.3 C)-99.3 F (37.4 C)] 97.6 F (36.4 C) (08/22 0825) Pulse Rate:  [65-98] 98 (08/22 0825) Resp:  [18-20] 20 (08/22  0825) BP: (106-170)/(69-81) 106/71 (08/22 0825) SpO2:  [94 %-100 %] 100 % (08/22 0825)  General - Well nourished, well developed, in no apparent distress.  Ophthalmologic - fundi not visualized due to noncooperation.  Cardiovascular - Regular rhythm and rate.  Mental Status -  Level of arousal and orientation to month and person were intact. However, not orientated to place, year, or age.  Language including expression, naming, repetition, comprehension was assessed and found intact. Mild dysarthria  Cranial Nerves II - XII - II - legally blind bilaterally, right hemianopia, left visual field about to have LP and HW only. III, IV, VI - Extraocular movements intact. V - Facial sensation intact bilaterally. VII - Facial movement intact bilaterally. VIII - Hearing & vestibular intact bilaterally. X - Palate elevates symmetrically. XI - Chin turning & shoulder shrug intact bilaterally. XII - Tongue protrusion intact.  Motor Strength - The patient's strength was normal in all extremities except right pronator drift was absent.  Bulk was normal and fasciculations were absent.   Motor Tone - Muscle tone was assessed at the neck and appendages and was normal.  Reflexes - The patient's reflexes were symmetrical in all extremities and he had no pathological reflexes.  Sensory - Light touch, temperature/pinprick were assessed and were symmetrical.    Coordination - The patient had normal movements in the hands with no ataxia or dysmetria.  Tremor was absent.  Gait and Station - deferred.   ASSESSMENT/PLAN [name] is a 64 y.o. male with history of hypertension, PAD, CVA, COPD, legally blind bilaterally, smoker and alcohol abuse, recent stroke due to left CCA occlusion discharged to [place] who was called back by rehab attending for worsening right-sided weakness and speech difficulty. He did not receive IV t-PA due to recent stroke and improving deficits.   TIA in the setting of  orthostatic hypotension and severe anemia with left CCA occlusion  Left MCA syndrome with low BP and anemia 8/21 -> resolved  CT no acute finding with chronic infarcts  CTA head and neck similar as before with left CCA occlusion  Received IVF bolus and infusion  Received albumin x 2 doses  Orthostatic vital sitting 135/76 and standing 106/71 - put on midodrine  Hb 8.0->7.0 - will receive PRBC  Continue BP and CBC monitoring  Recent stroke 08/02/2020 - left posterior MCA and left PCA infarcts, secondary to left CCA occlusion, large vessel disease source  CT showed left parieto-occipital acute infarct, and old right CR infarct.   CTA head and neck showed left CCA possible occlusion, and left ICA reconstitution with slow flow. Right ICA stent patent, bilateral siphon high-grade stenosis. Left P2 high-grade stenosis.   CTP 33 cc core with 45 penumbra.  MRI  Acute infarcts  involving left posterior greater than anterior circulations, noting presence of a fetal left PCA on the prior CTA  IR left CCA mid to distal occlusion, left MCA and ACA patent, right ICA stent patent.  2D Echo EF 55 to 60%  LDL 47  HgbA1c 6.0  Lovenox for VTE prophylaxis  No antithrombotic prior to admission, now on aspirin 325 mg daily and clopidogrel 75 mg daily DAPT for 3 months and then aspirin alone.  Carotid stenosis  08/2019 cerebral angiogram showed right ICA 90% stenosis, left CCA 50% stenosis, left ICA 50% stenosis.  Left fetal PCA.  Left VA occluded with distal reconstitution  09/2019 status post right carotid stenting with Dr. Estanislado Pandy  This admission CTA head neck left CCA occlusion, right ICA stent patent  This admission cerebral angiogram left CCA mid to distal occlusion, right ICA stent patent  No endovascular procedure indicated, continue medical management.  Long-term BP goal 130-150  Orthostatic hypotension Hx of hypertension  Still positive for orthostatic  hypotension  On bedrest  Orthostatic vitals showed sitting 135/76, standing 106/71   On TED hose  On gentle hydration - NS @ 50  On midodrine now 5mg  tid  Severe anemia  Hb 8.0->7.0  Stool occult blood pending  Will receive PRBC 2U transfusion  CBC showed microcytic anemia  Will add iron pills   Monitoring CBC  Hyperlipidemia  Home meds: Lipitor 40  LDL 47, goal < 70  Now on Lipitor 80  Continue statin at discharge  ? Lung cancer   CTA neck showed Pulmonary nodules in the apical lungs including a 15 mm left apical subpleural nodule which appears to invade the pleura, primarily worrisome for malignancy/metastatic disease.   Consider chest CT once more stable   ? 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 After hours, contact General Neurology

## 2020-08-15 NOTE — Progress Notes (Signed)
Called Donel Osowski (son) [phone], received verbal consent to administer 2 units of PRC. Informed of risk and benefits. Dual consent with Gaynelle Cage RN. Consent placed in pt file.

## 2020-08-16 ENCOUNTER — Inpatient Hospital Stay (HOSPITAL_COMMUNITY): Payer: Medicare Other | Admitting: Occupational Therapy

## 2020-08-16 ENCOUNTER — Inpatient Hospital Stay (HOSPITAL_COMMUNITY): Payer: Medicare Other

## 2020-08-16 DIAGNOSIS — K922 Gastrointestinal hemorrhage, unspecified: Secondary | ICD-10-CM

## 2020-08-16 DIAGNOSIS — C787 Secondary malignant neoplasm of liver and intrahepatic bile duct: Secondary | ICD-10-CM

## 2020-08-16 DIAGNOSIS — C7800 Secondary malignant neoplasm of unspecified lung: Secondary | ICD-10-CM

## 2020-08-16 LAB — CBC WITH DIFFERENTIAL/PLATELET
Abs Immature Granulocytes: 0.03 10*3/uL (ref 0.00–0.07)
Abs Immature Granulocytes: 0.03 10*3/uL (ref 0.00–0.07)
Abs Immature Granulocytes: 0 10*3/uL (ref 0.00–0.07)
Basophils Absolute: 0.1 10*3/uL (ref 0.0–0.1)
Basophils Absolute: 0.1 10*3/uL (ref 0.0–0.1)
Basophils Absolute: 0 10*3/uL (ref 0.0–0.1)
Basophils Relative: 1 %
Basophils Relative: 1 %
Basophils Relative: 0 %
Eosinophils Absolute: 0.3 10*3/uL (ref 0.0–0.5)
Eosinophils Absolute: 0.3 10*3/uL (ref 0.0–0.5)
Eosinophils Absolute: 0.3 10*3/uL (ref 0.0–0.5)
Eosinophils Relative: 5 %
Eosinophils Relative: 4 %
Eosinophils Relative: 4 %
HCT: 29.8 % — ABNORMAL LOW (ref 39.0–52.0)
HCT: 28.6 % — ABNORMAL LOW (ref 39.0–52.0)
HCT: 28.6 % — ABNORMAL LOW (ref 39.0–52.0)
Hemoglobin: 8.9 g/dL — ABNORMAL LOW (ref 13.0–17.0)
Hemoglobin: 8.5 g/dL — ABNORMAL LOW (ref 13.0–17.0)
Hemoglobin: 8.5 g/dL — ABNORMAL LOW (ref 13.0–17.0)
Immature Granulocytes: 1 %
Immature Granulocytes: 0 %
Lymphocytes Relative: 16 %
Lymphocytes Relative: 15 %
Lymphocytes Relative: 23 %
Lymphs Abs: 1.1 10*3/uL (ref 0.7–4.0)
Lymphs Abs: 1.2 10*3/uL (ref 0.7–4.0)
Lymphs Abs: 1.6 10*3/uL (ref 0.7–4.0)
MCH: 23.2 pg — ABNORMAL LOW (ref 26.0–34.0)
MCH: 23.1 pg — ABNORMAL LOW (ref 26.0–34.0)
MCH: 23 pg — ABNORMAL LOW (ref 26.0–34.0)
MCHC: 29.9 g/dL — ABNORMAL LOW (ref 30.0–36.0)
MCHC: 29.7 g/dL — ABNORMAL LOW (ref 30.0–36.0)
MCHC: 29.7 g/dL — ABNORMAL LOW (ref 30.0–36.0)
MCV: 77.8 fL — ABNORMAL LOW (ref 80.0–100.0)
MCV: 77.7 fL — ABNORMAL LOW (ref 80.0–100.0)
MCV: 77.5 fL — ABNORMAL LOW (ref 80.0–100.0)
Monocytes Absolute: 0.6 10*3/uL (ref 0.1–1.0)
Monocytes Absolute: 0.7 10*3/uL (ref 0.1–1.0)
Monocytes Absolute: 0.3 10*3/uL (ref 0.1–1.0)
Monocytes Relative: 9 %
Monocytes Relative: 9 %
Monocytes Relative: 5 %
Neutro Abs: 4.6 10*3/uL (ref 1.7–7.7)
Neutro Abs: 5.5 10*3/uL (ref 1.7–7.7)
Neutro Abs: 4.7 10*3/uL (ref 1.7–7.7)
Neutrophils Relative %: 68 %
Neutrophils Relative %: 71 %
Neutrophils Relative %: 68 %
Platelets: 211 10*3/uL (ref 150–400)
Platelets: 229 10*3/uL (ref 150–400)
Platelets: 232 10*3/uL (ref 150–400)
RBC: 3.83 MIL/uL — ABNORMAL LOW (ref 4.22–5.81)
RBC: 3.68 MIL/uL — ABNORMAL LOW (ref 4.22–5.81)
RBC: 3.69 MIL/uL — ABNORMAL LOW (ref 4.22–5.81)
RDW: 21.1 % — ABNORMAL HIGH (ref 11.5–15.5)
RDW: 21.2 % — ABNORMAL HIGH (ref 11.5–15.5)
RDW: 21.3 % — ABNORMAL HIGH (ref 11.5–15.5)
WBC: 6.6 10*3/uL (ref 4.0–10.5)
WBC: 7.7 10*3/uL (ref 4.0–10.5)
WBC: 6.9 10*3/uL (ref 4.0–10.5)
nRBC: 0 % (ref 0.0–0.2)
nRBC: 0 % (ref 0.0–0.2)
nRBC: 0 % (ref 0.0–0.2)
nRBC: 0 /100 WBC

## 2020-08-16 LAB — TYPE AND SCREEN
ABO/RH(D): O NEG
Antibody Screen: NEGATIVE
Unit division: 0
Unit division: 0

## 2020-08-16 LAB — BASIC METABOLIC PANEL
Anion gap: 9 (ref 5–15)
BUN: 11 mg/dL (ref 8–23)
CO2: 23 mmol/L (ref 22–32)
Calcium: 8.2 mg/dL — ABNORMAL LOW (ref 8.9–10.3)
Chloride: 106 mmol/L (ref 98–111)
Creatinine, Ser: 0.62 mg/dL (ref 0.61–1.24)
GFR calc Af Amer: >60 mL/min (ref >60)
GFR calc non Af Amer: >60 mL/min (ref >60)
Glucose, Bld: 103 mg/dL — ABNORMAL HIGH (ref 70–99)
Potassium: 3.6 mmol/L (ref 3.5–5.1)
Sodium: 138 mmol/L (ref 135–145)

## 2020-08-16 LAB — BPAM RBC
Blood Product Expiration Date: 202109132359
Blood Product Expiration Date: 202109182359
ISSUE DATE / TIME: 202108221327
ISSUE DATE / TIME: 202108221623
Unit Type and Rh: 9500
Unit Type and Rh: 9500

## 2020-08-16 MED ORDER — PANTOPRAZOLE SODIUM 40 MG PO TBEC
40.0000 mg | DELAYED_RELEASE_TABLET | Freq: Every day | ORAL | Status: DC
  Administered 2020-08-16: 40 mg via ORAL
  Filled 2020-08-16 (×2): qty 1

## 2020-08-16 MED ORDER — PANTOPRAZOLE SODIUM 40 MG IV SOLR
40.0000 mg | Freq: Two times a day (BID) | INTRAVENOUS | Status: DC
  Administered 2020-08-16 – 2020-08-18 (×5): 40 mg via INTRAVENOUS
  Filled 2020-08-16 (×5): qty 40

## 2020-08-16 MED ORDER — SODIUM CHLORIDE 0.9 % IV SOLN: INTRAVENOUS | Status: DC

## 2020-08-16 NOTE — Progress Notes (Signed)
2nd unit of blood completed at 1930. No adverse reactions to blood. A & O to self and "hospital",  [facility], unable to figure out date and time. Last charted BM-08/18, PRN sorbitol given at 1824. At 2110, observed patient attempting to get OOB without assistance. Bed alarm in use. Large incontinent  and large continent BM on BSC. Stool loose and black. Quiac sent-positive results. Spoke with Dr. Naaman Plummer, orders to hold lovenox and start protonix, 1st dose given at 0044.  Denies pain or SOB. This morning, reports not feeling great, without specific complaint of. Did not want to get up to check orthostatic vitals-"I don't feel like it." 0.9NS infusing at 50cc/hr. Large incontinent void, PVR=0. Patrici Ranks A

## 2020-08-16 NOTE — Plan of Care (Signed)
  Problem: Consults Goal: RH STROKE PATIENT EDUCATION Description: Patient will be aware of signs and symptoms of impending stroke and keep blood glucose controlled.  Outcome: Progressing   Problem: RH BOWEL ELIMINATION Goal: RH STG MANAGE BOWEL WITH ASSISTANCE Description: STG Manage Bowel with min Assistance. Outcome: Progressing Goal: RH STG MANAGE BOWEL W/MEDICATION W/ASSISTANCE Description: STG Manage Bowel with Medication with mod I Assistance. Outcome: Progressing   Problem: RH BLADDER ELIMINATION Goal: RH STG MANAGE BLADDER WITH ASSISTANCE Description: STG Manage Bladder With min Assistance Outcome: Progressing   Problem: RH SKIN INTEGRITY Goal: RH STG SKIN FREE OF INFECTION/BREAKDOWN Description: Pt will be free of skin breakdown/infection with supervision assist prior to DC Outcome: Progressing Goal: RH STG MAINTAIN SKIN INTEGRITY WITH ASSISTANCE Description: STG Maintain Skin Integrity With supervision Assistance. Outcome: Progressing   Problem: RH SAFETY Goal: RH STG ADHERE TO SAFETY PRECAUTIONS W/ASSISTANCE/DEVICE Description: STG Adhere to Safety Precautions With cues/reminders assistance Outcome: Progressing Goal: RH STG DECREASED RISK OF FALL WITH ASSISTANCE Description: STG Decreased Risk of Fall With cues/reminders Assistance. Outcome: Progressing   Problem: RH KNOWLEDGE DEFICIT Goal: RH STG INCREASE KNOWLEDGE OF HYPERTENSION Description: Pt will be able to demonstrate understanding of HTN management using booklets/handouts with min assist due to poor vision  Outcome: Progressing Goal: RH STG INCREASE KNOWLEGDE OF HYPERLIPIDEMIA Description: Pt will be able to demonstrate understanding of HLD management using booklets/handouts with min assist due to poor vision  Outcome: Progressing Goal: RH STG INCREASE KNOWLEDGE OF STROKE PROPHYLAXIS Description: Pt will be able to demonstrate understanding of stroke prevention using booklets/handouts with min assist due  to poor vision  Outcome: Progressing

## 2020-08-16 NOTE — Progress Notes (Signed)
PHYSICAL MEDICINE & REHABILITATION PROGRESS NOTE  Subjective/Complaints: SLP in treatment , son at bedside  Discussed bloodwork, + GI bleed , need for antiplt post stent/CVA Pt desires to go home but discussed need for GI eval   ROS: limited due to language/communication   Objective: Vital Signs: Blood pressure (!) 150/75, pulse 78, temperature 97.7 F (36.5 C), temperature source Oral, resp. rate 18, SpO2 96 %. CT ANGIO HEAD W OR WO CONTRAST  Result Date: 08/14/2020 CLINICAL DATA:  Code stroke with right-sided weakness. EXAM: CT ANGIOGRAPHY HEAD AND NECK TECHNIQUE: Multidetector CT imaging of the head and neck was performed using the standard protocol during bolus administration of intravenous contrast. Multiplanar CT image reconstructions and MIPs were obtained to evaluate the vascular anatomy. Carotid stenosis measurements (when applicable) are obtained utilizing NASCET criteria, using the distal internal carotid diameter as the denominator. CONTRAST:  57mL OMNIPAQUE IOHEXOL 350 MG/ML SOLN COMPARISON:  08/02/2020 FINDINGS: CTA NECK FINDINGS Aortic arch: Atheromatous plaque. Right carotid system: Atheromatous wall thickening diffusely in the common carotid and ICA. There is a stent spanning the bifurcation which is patent. No downstream beading or dissection Left carotid system: Chronic occlusion beginning just after the common carotid origin with irregular calcified plaque. There is reconstitution at the bifurcation with narrowing less intensely enhancing left ICA that is patent into the skull. Vertebral arteries: Atheromatous plaque in the bilateral subclavian artery. Dominant right vertebral artery which is widely patent to the dura. No flow seen at the left V1 segment, but there is reconstitution and flow seen by the basilar. Skeleton: Advanced cervical spine degeneration. No acute or aggressive finding. Other neck: No acute finding. Upper chest: Emphysema. Scattered pulmonary  nodules, most notably at nodule measuring 15 mm at the left apex which appears to invade the adjacent pleura. Review of the MIP images confirms the above findings CTA HEAD FINDINGS Anterior circulation: Heavily calcified bilateral carotid siphons and petrous segments. The segments are recently evaluated by catheter angiography which is better due to the degree of artifact from skull base and calcified plaque. No emergent branch occlusion or aneurysm. Posterior circulation: Right vertebral artery dominance. Left V4 segment calcified plaque. Basilar artery is widely patent. Fetal type left PCA flow, which may be acquired. Venous sinuses: Diffusely patent Anatomic variants: As above Review of the MIP images confirms the above findings These results were communicated to Dr Erlinda Hong at 12:47 pmon 8/21/2021by text page via the [facility] messaging system. IMPRESSION: 1. No emergent vascular finding. No change from CTA and catheter angiogram earlier this month. 2. 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 3. Diffusely patent right cervical carotid stent. 4. Pulmonary nodules in the apical lungs including a 15 mm left apical subpleural nodule which appears to invade the pleura, primarily worrisome for malignancy/metastatic disease. Recommend dedicated chest CT. Electronically Signed   By: Monte Fantasia M.D.   On: 08/14/2020 12:49   CT ANGIO NECK W OR WO CONTRAST  Result Date: 08/14/2020 CLINICAL DATA:  Code stroke with right-sided weakness. EXAM: CT ANGIOGRAPHY HEAD AND NECK TECHNIQUE: Multidetector CT imaging of the head and neck was performed using the standard protocol during bolus administration of intravenous contrast. Multiplanar CT image reconstructions and MIPs were obtained to evaluate the vascular anatomy. Carotid stenosis measurements (when applicable) are obtained utilizing NASCET criteria, using the distal internal carotid diameter as the denominator. CONTRAST:  97mL OMNIPAQUE  IOHEXOL 350 MG/ML SOLN COMPARISON:  08/02/2020 FINDINGS: CTA NECK FINDINGS Aortic arch: Atheromatous plaque. Right  carotid system: Atheromatous wall thickening diffusely in the common carotid and ICA. There is a stent spanning the bifurcation which is patent. No downstream beading or dissection Left carotid system: Chronic occlusion beginning just after the common carotid origin with irregular calcified plaque. There is reconstitution at the bifurcation with narrowing less intensely enhancing left ICA that is patent into the skull. Vertebral arteries: Atheromatous plaque in the bilateral subclavian artery. Dominant right vertebral artery which is widely patent to the dura. No flow seen at the left V1 segment, but there is reconstitution and flow seen by the basilar. Skeleton: Advanced cervical spine degeneration. No acute or aggressive finding. Other neck: No acute finding. Upper chest: Emphysema. Scattered pulmonary nodules, most notably at nodule measuring 15 mm at the left apex which appears to invade the adjacent pleura. Review of the MIP images confirms the above findings CTA HEAD FINDINGS Anterior circulation: Heavily calcified bilateral carotid siphons and petrous segments. The segments are recently evaluated by catheter angiography which is better due to the degree of artifact from skull base and calcified plaque. No emergent branch occlusion or aneurysm. Posterior circulation: Right vertebral artery dominance. Left V4 segment calcified plaque. Basilar artery is widely patent. Fetal type left PCA flow, which may be acquired. Venous sinuses: Diffusely patent Anatomic variants: As above Review of the MIP images confirms the above findings These results were communicated to Dr Erlinda Hong at 12:47 pmon 8/21/2021by text page via the [facility] messaging system. IMPRESSION: 1. No emergent vascular finding. No change from CTA and catheter angiogram earlier this month. 2. Chronic left common carotid occlusion with reconstitution  at the bifurcation. 3. Diffusely patent right cervical carotid stent. 4. Pulmonary nodules in the apical lungs including a 15 mm left apical subpleural nodule which appears to invade the pleura, primarily worrisome for malignancy/metastatic disease. Recommend dedicated chest CT. Electronically Signed   By: Monte Fantasia M.D.   On: 08/14/2020 12:49   CT HEAD CODE STROKE WO CONTRAST  Result Date: 08/14/2020 CLINICAL DATA:  Code stroke. Right-sided weakness and speech difficulty EXAM: CT HEAD WITHOUT CONTRAST TECHNIQUE: Contiguous axial images were obtained from the base of the skull through the vertex without intravenous contrast. COMPARISON:  08/02/2020 CT.  Brain MRI 08/02/2020 FINDINGS: Brain: Expected evolution of left occipital and frontal parietal convexity infarct which have become more low-density. Large remote right parietal infarct. Remote left perforator infarct at the basal ganglia. Chronic small vessel ischemic gliosis in the white matter. No hemorrhage, hydrocephalus, or masslike finding. Vascular: Negative Skull: Negative Sinuses/Orbits: Chronic right maxillary sinusitis. Other: These results were communicated to Xu at 11:58 amon 8/21/2021by text page via the [facility] messaging system. ASPECTS [facility] Stroke Program Early CT Score) Not scored in this setting IMPRESSION: 1. No acute finding when compared with prior. 2. Expected evolution of recent left occipital and border zone infarcts. 3. Remote right parietal infarct. Electronically Signed   By: Monte Fantasia M.D.   On: 08/14/2020 12:00   Recent Labs    08/15/20 0507 08/16/20 0532  WBC 5.9 6.6  HGB 7.0* 8.9*  HCT 24.9* 29.8*  PLT 236 211   Recent Labs    08/15/20 0507 08/16/20 0529  NA 136 138  K 3.6 3.6  CL 106 106  CO2 23 23  GLUCOSE 111* 103*  BUN 8 11  CREATININE 0.60* 0.62  CALCIUM 8.3* 8.2*    Physical Exam: BP (!) 150/75 (BP Location: Left Arm)   Pulse 78   Temp 97.7 F (36.5 C) (Oral)  Resp 18   SpO2 96%    General: No acute distress Mood and affect are appropriate Heart: Regular rate and rhythm no rubs murmurs or extra sounds Lungs: Clear to auscultation, breathing unlabored, no rales or wheezes Abdomen: Positive bowel sounds, soft nontender to palpation, nondistended Extremities: No clubbing, cyanosis, or edema Skin: No evidence of breakdown, no evidence of rash  Neuro: Alert, following commands. Still with word finding deficits. Unable to ID basic items visually or when given verbal cues. Motor:  LUE: 4-5/5 proximal distal, stable RUE: 3 to 3+/5 shoulder abduction, elbow flexion/extension, handgrip, finger extension 1-2/5, improved today LLE: 4-5/5 proximal distal RLE: 3-4/5 proximal and distal--improved from yesterday  Assessment/Plan: 1. Functional deficits secondary to left posterior MCA/PCA infarcts which require 3+ hours per day of interdisciplinary therapy in a comprehensive inpatient rehab setting.  Physiatrist is providing close team supervision and 24 hour management of active medical problems listed below.  Physiatrist and rehab team continue to assess barriers to discharge/monitor patient progress toward functional and medical goals  Care Tool:  Bathing    Body parts bathed by patient: Right arm, Chest, Abdomen, Front perineal area, Face   Body parts bathed by helper: Left arm, Buttocks, Right upper leg, Left upper leg, Right lower leg, Left lower leg Body parts n/a: Buttocks   Bathing assist Assist Level: Moderate Assistance - Patient 50 - 74%     Upper Body Dressing/Undressing Upper body dressing Upper body dressing/undressing activity did not occur (including orthotics): N/A What is the patient wearing?: Pull over shirt    Upper body assist Assist Level: Minimal Assistance - Patient > 75%    Lower Body Dressing/Undressing Lower body dressing    Lower body dressing activity did not occur: N/A What is the patient wearing?: Pants     Lower body assist  Assist for lower body dressing: Moderate Assistance - Patient 50 - 74%     Toileting Toileting Toileting Activity did not occur Landscape architect and hygiene only): N/A (no void or bm)  Toileting assist Assist for toileting: Minimal Assistance - Patient > 75% (urinal placement)     Transfers Chair/bed transfer  Transfers assist     Chair/bed transfer assist level: Minimal Assistance - Patient > 75%     Locomotion Ambulation   Ambulation assist      Assist level: Minimal Assistance - Patient > 75% Assistive device: Walker-rolling Max distance: 110   Walk 10 feet activity   Assist     Assist level: Minimal Assistance - Patient > 75% Assistive device: Walker-rolling   Walk 50 feet activity   Assist    Assist level: Minimal Assistance - Patient > 75% Assistive device: Walker-rolling    Walk 150 feet activity   Assist Walk 150 feet activity did not occur: Safety/medical concerns         Walk 10 feet on uneven surface  activity   Assist     Assist level: Minimal Assistance - Patient > 75% Assistive device: Hand held assist   Wheelchair     Assist Will patient use wheelchair at discharge?: No             Wheelchair 50 feet with 2 turns activity    Assist            Wheelchair 150 feet activity     Assist           BP (!) 150/75 (BP Location: Left Arm)   Pulse 78   Temp 97.7 F (36.5  C) (Oral)   Resp 18   SpO2 96%   Medical Problem List and Plan: 1.  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  -pt s/p code stroke. Head CT, CTA do not demonstrate new event or occlusion. Sx d/t hypoperfusion given orthostasis. Pt still not back to neuro baseline however.  Hgb down to 7.0 today likely related to volume resuscitation.    -neuro following   -midodrine trial to  prop up bp   -continue IVF   -will transfuse 2u prbc d/t symptomatic anemia   -keep on bedrest until bp more robust.     -diet resumed 2.  Antithrombotics: -DVT/anticoagulation: Lovenox             -antiplatelet therapy: Aspirin 325 mg daily and Plavix 75 mg daily x3 months then aspirin alone 3. Pain Management: Tylenol as needed.  4. Mood/dementia: Provide emotional support             -antipsychotic agents: N/A  -team providing ego support  5. Neuropsych/dementia: This patient is  not capable of making decisions on his own behalf. 6. Skin/Wound Care: Routine skin checks 7. Fluids/Electrolytes/Nutrition: Routine in and outs. 8.  Hypertension with orthostasis.     coreg  6.25mg  BID---held d/t #1    Vitals:   08/15/20 2126 08/16/20 0422  BP: (!) 158/84 (!) 150/75  Pulse: 81 78  Resp: 20 18  Temp: (!) 97.4 F (36.3 C) 97.7 F (36.5 C)  SpO2: 97% 43%  elevated systolic but had 15QMGQ drop sit to stand yesterday  9.  COPD.  Oxygen dependent.  Check oxygen saturations every shift  8/22 Pulmonary nodule seen on CTA in left apex, 46mm      -primary team can discuss further with pt/family this week.   -?onc/pulmonary consult 10.  Legally blind bilaterally.  Patient has assistance of son at home- due to macular degeneration 11.  History of tobacco and alcohol use.  Provide counseling 12.  Hyperlipidemia.    Continue Lipitor 13.  Questionable medical compliance.  Provide counseling with patient and family. 14.?  Acute blood loss anemia  Hemoglobin 7.6 on 8/20-severe drop, now 7.0 today after IVF  -heme + Stool will consult GI given need for anti plt therapy   -transfuse 2u as above  -  -off  lovenox for now  Greater than 35 total minutes was spent in examination of patient, assessment of pertinent data,  formulation of a treatment plan, and in discussion with patient and/or family.     LOS: 11 days A FACE TO FACE EVALUATION WAS PERFORMED  Charlett Blake 08/16/2020, 10:33  AM

## 2020-08-16 NOTE — Progress Notes (Signed)
Speech Language Pathology Daily Session Note  Patient Details  Name: Nathan Hart MRN: [id number] Date of Birth: November 27, 1956  Today's Date: 08/16/2020 SLP Individual Time: 1000-1055 SLP Individual Time Calculation (min): 55 min  Short Term Goals: Week 2: SLP Short Term Goal 1 (Week 2): STG's=LTG's due to ELOS  Skilled Therapeutic Interventions: Skilled ST services focused on education and cognitive skills. SLP reassessed cognitive and language skills for planned discharge, administering subsections of Cognistat and WAB due to legal blindness. Pt demonstrated WFL in attention, immediate recall, repeating at sentence level, following 2 step commands and response to simple/complex yes/no questions with 90% accuracy. Pt demonstrated difficulty with increase complexity of language and mildly complex tasks, example delayed recall, calculation, similarities and judgement subsections. Pt demonstrated no spontaneous language and verbal output was at phrase and simple sentence level with continued flat affect. Pt's son [name] enter and SLP provided education pertaining to current deficits and improvements from initial evaluation. MD entered room and informed pt/pt's son of need to continue LOS due GI consultation. Pt expressed verbal frustration at the phrase level. Pt and pt's son appeared to understand extending stay due to medical issues and all questions answered to satisfaction. SLP recommends to continue skilled ST services.     Pain Pain Assessment Pain Score: 0-No pain  Therapy/Group: Individual Therapy  Kayton Dunaj  [facility] 08/16/2020, 12:33 PM

## 2020-08-16 NOTE — H&P (View-Only) (Signed)
Referring Provider: Dr. Letta Pate Primary Care Physician:  Patient, No Pcp Per Primary Gastroenterologist:  Althia Forts  Reason for Consultation:  Melena, anemia  HPI: Nathan Hart is a 64 y.o. 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  Patient is legally blind, and thus states he is not aware of the color of his bowel movements.  Overnight, he had one liquid melenic stool.  No other melenic bowel movements noted.  No hematochezia noted per flow chart.  Patient reports recent decrease in appetite and some weight loss during his recent hospitalization, though he is unable to quantify amount of weight he has lost.  Denies any nausea, vomiting, hematemesis, dysphagia, GERD, abdominal pain, constipation.  Patient states he believes he had a stomach ulcer in the past, though he cannot recall whether or not this was diagnosed via EGD.  States he has never had a colonoscopy.  Patient is currently on Plavix, as well as your 325 mg daily aspirin due to recent stroke.  Patient denies any family history of colon cancer or gastrointestinal malignancy.  Past Medical History:  Diagnosis Date  . Abnormal peripheral vision of left eye   . Blind right eye   . COPD (chronic obstructive pulmonary disease) (HCC)     home o2 hs and prn  . Dyspnea    with activity  . Empyema lung ([place])   . ETOH abuse   . HTN (hypertension)   . Peripheral vascular disease ([place])   . Pneumonia 2020  . Stroke [facility])     Past Surgical History:  Procedure Laterality Date  . BACK SURGERY     disc lumar x 2   . CLAVICLE SURGERY Right   . IR ANGIO INTRA EXTRACRAN SEL COM CAROTID INNOMINATE BILAT MOD SED  09/12/2019  . IR ANGIO INTRA EXTRACRAN SEL COM CAROTID INNOMINATE BILAT MOD SED  08/03/2020  . IR ANGIO VERTEBRAL SEL SUBCLAVIAN  INNOMINATE UNI L MOD SED  09/12/2019  . IR ANGIO VERTEBRAL SEL VERTEBRAL UNI R MOD SED  09/12/2019  . IR ANGIO VERTEBRAL SEL VERTEBRAL UNI R MOD SED  08/03/2020  . IR ANGIOGRAM EXTREMITY LEFT  08/03/2020  . IR INTRAVSC STENT CERV CAROTID W/EMB-PROT MOD SED INCL ANGIO  09/24/2019  . IR US GUIDE VASC ACCESS RIGHT  09/12/2019  . IR US GUIDE VASC ACCESS RIGHT  08/03/2020  . KNEE SURGERY Right     x 3 ACL  . RADIOLOGY WITH ANESTHESIA N/A 09/24/2019   Procedure: IR WITH ANESTHESIA/STENTING;  Surgeon: Luanne Bras, MD;  Location: [facility];  Service: Radiology;  Laterality: N/A;  . [place]    Prior to Admission medications   Medication Sig Start Date End Date Taking? Authorizing Provider  acetaminophen (TYLENOL) 325 MG tablet Take 650 mg by mouth every 6 (six) hours as needed for moderate pain or headache.    [provider]  albuterol (PROVENTIL) (2.5 MG/3ML) 0.083% nebulizer solution Take 3 mLs (2.5 mg total) by nebulization every 6 (six) hours as needed for wheezing or shortness of breath. For breathing 09/09/19   Roxan Hockey, MD  aspirin EC 325 MG EC tablet Take 1 tablet (325 mg total) by mouth daily. 08/06/20   Terrilee Croak, MD  atorvastatin (LIPITOR) 40 MG tablet Take 1 tablet (40 mg total) by mouth every evening. For  Cholesterol 09/09/19   Roxan Hockey, MD  carvedilol (COREG) 3.125 MG tablet Take 1 tablet (3.125 mg total) by mouth 2 (two) times daily with a meal. 08/05/20   Dahal, Marlowe Aschoff, MD  clopidogrel (PLAVIX) 75 MG tablet Take 1 tablet (75 mg total) by mouth daily. 08/06/20 11/05/20  Terrilee Croak, MD  diphenhydrAMINE HCl, Sleep, (ZZZQUIL) 25 MG CAPS Take 25 mg by mouth at bedtime as needed (sleep).    [provider]  folic acid (FOLVITE) 1 MG tablet Take 1 tablet (1 mg total) by mouth daily. 09/10/19   Roxan Hockey, MD  Multiple Vitamin (MULTIVITAMIN WITH MINERALS) TABS tablet Take 1 tablet by mouth daily. 08/06/20   Dahal, Marlowe Aschoff, MD   senna-docusate (SENOKOT-S) 8.6-50 MG tablet Take 1 tablet by mouth at bedtime as needed for moderate constipation. 08/05/20   Terrilee Croak, MD  thiamine 100 MG tablet Take 1 tablet (100 mg total) by mouth daily. 08/06/20   Terrilee Croak, MD    Scheduled Meds: . aspirin EC  325 mg Oral Daily   Or  . aspirin  300 mg Rectal Daily  . atorvastatin  80 mg Oral Daily  . ferrous sulfate  325 mg Oral TID WC  . folic acid  1 mg Oral Daily  . midodrine  5 mg Oral TID  . multivitamin with minerals  1 tablet Oral Daily  . pantoprazole (PROTONIX) IV  40 mg Intravenous Q12H  . thiamine  100 mg Oral Daily   Or  . thiamine  100 mg Intravenous Daily   Continuous Infusions: . sodium chloride 50 mL/hr at 08/16/20 0300   PRN Meds:.acetaminophen **OR** acetaminophen (TYLENOL) oral liquid 160 mg/5 mL **OR** acetaminophen, senna-docusate, sorbitol  Allergies as of 08/05/2020  . (No Known Allergies)    Family History  Problem Relation Age of Onset  . Cancer Mother     Social History   Socioeconomic History  . Marital status: Legally Separated    Spouse name: Not on file  . Number of children: Not on file  . Years of education: Not on file  . Highest education level: Not on file  Occupational History  . Not on file  Tobacco Use  . Smoking status: Current Every Day Smoker    Packs/day: 1.50    Years: 9.00    Pack years: 13.50    Types: Cigarettes  . Smokeless tobacco: Never Used  Vaping Use  . Vaping Use: Never used  Substance and Sexual Activity  . Alcohol use: Yes    Alcohol/week: 20.0 standard drinks    Types: 20 Cans of beer per week  . Drug use: Never  . Sexual activity: Not on file  Other Topics Concern  . Not on file  Social History Narrative  . Not on file   Social Determinants of Health   Financial Resource Strain:   . Difficulty of Paying Living Expenses: Not on file  Food Insecurity:   . Worried About Charity fundraiser in the Last Year: Not on file  . Ran Out of  Food in the Last Year: Not on file  Transportation Needs:   . Lack of Transportation (Medical): Not on file  . Lack of Transportation (Non-Medical): Not on file  Physical Activity:   . Days of Exercise per Week: Not on file  . Minutes of Exercise per Session: Not on file  Stress:   . Feeling of Stress : Not on file  Social Connections:   . Frequency of Communication with  Friends and Family: Not on file  . Frequency of Social Gatherings with Friends and Family: Not on file  . Attends Religious Services: Not on file  . Active Member of Clubs or Organizations: Not on file  . Attends Archivist Meetings: Not on file  . Marital Status: Not on file  Intimate Partner Violence:   . Fear of Current or Ex-Partner: Not on file  . Emotionally Abused: Not on file  . Physically Abused: Not on file  . Sexually Abused: Not on file    Review of Systems: Review of Systems  Constitutional: Positive for malaise/fatigue and weight loss. Negative for chills and fever.  HENT: Negative for hearing loss and tinnitus.   Eyes:       Legally blind  Respiratory: Positive for shortness of breath. Negative for cough.   Cardiovascular: Negative for chest pain and palpitations.  Gastrointestinal: Positive for diarrhea and melena. Negative for abdominal pain, blood in stool, constipation, heartburn, nausea and vomiting.  Genitourinary: Negative for hematuria and urgency.  Musculoskeletal: Negative for back pain and neck pain.  Skin: Negative for itching and rash.  Neurological: Positive for weakness. Negative for seizures.  Endo/Heme/Allergies: Negative for polydipsia. Does not bruise/bleed easily.  Psychiatric/Behavioral: Positive for substance abuse (?ETOH per chart review). The patient is not nervous/anxious.     Physical Exam: Vital signs: Vitals:   08/15/20 2126 08/16/20 0422  BP: (!) 158/84 (!) 150/75  Pulse: 81 78  Resp: 20 18  Temp: (!) 97.4 F (36.3 C) 97.7 F (36.5 C)  SpO2: 97%  96%   Last BM Date: 08/11/20 Physical Exam Vitals and nursing note reviewed.  Constitutional:      General: He is not in acute distress.    Comments: Son at bedside  HENT:     Head: Normocephalic and atraumatic.     Nose: Nose normal.     Mouth/Throat:     Mouth: Mucous membranes are moist.     Pharynx: Oropharynx is clear.  Eyes:     Extraocular Movements: Extraocular movements intact.     Comments: Conjunctival pallor  Cardiovascular:     Rate and Rhythm: Normal rate and regular rhythm.     Pulses: Normal pulses.     Heart sounds: Normal heart sounds.  Pulmonary:     Effort: Pulmonary effort is normal.     Breath sounds: Normal breath sounds.  Abdominal:     General: Abdomen is flat. Bowel sounds are normal. There is no distension.     Palpations: Abdomen is soft. There is no mass.     Tenderness: There is no abdominal tenderness. There is no guarding or rebound.     Hernia: No hernia is present.  Musculoskeletal:        General: No swelling or tenderness.     Cervical back: Normal range of motion and neck supple.  Skin:    General: Skin is warm and dry.  Neurological:     General: No focal deficit present.     Mental Status: He is oriented to person, place, and time. He is lethargic.  Psychiatric:        Attention and Perception: Attention normal.        Mood and Affect: Affect is flat.        Thought Content: Thought content normal.        Judgment: Judgment normal.      GI:  Lab Results: Recent Labs    08/14/20 1127 08/15/20 0507 08/16/20  0532  WBC 7.0 5.9 6.6  HGB 8.0* 7.0* 8.9*  HCT 27.6* 24.9* 29.8*  PLT 259 236 211   BMET Recent Labs    08/14/20 1127 08/15/20 0507 08/16/20 0529  NA 139 136 138  K 3.7 3.6 3.6  CL 104 106 106  CO2 22 23 23   GLUCOSE 153* 111* 103*  BUN 14 8 11   CREATININE 0.71 0.60* 0.62  CALCIUM 8.9 8.3* 8.2*   LFT No results for input(s): PROT, ALBUMIN, AST, ALT, ALKPHOS, BILITOT, BILIDIR, IBILI in the last 72  hours. PT/INR No results for input(s): LABPROT, INR in the last 72 hours.   Studies/Results: CT ANGIO HEAD W OR WO CONTRAST  Result Date: 08/14/2020 CLINICAL DATA:  Code stroke with right-sided weakness. EXAM: CT ANGIOGRAPHY HEAD AND NECK TECHNIQUE: Multidetector CT imaging of the head and neck was performed using the standard protocol during bolus administration of intravenous contrast. Multiplanar CT image reconstructions and MIPs were obtained to evaluate the vascular anatomy. Carotid stenosis measurements (when applicable) are obtained utilizing NASCET criteria, using the distal internal carotid diameter as the denominator. CONTRAST:  73mL OMNIPAQUE IOHEXOL 350 MG/ML SOLN COMPARISON:  08/02/2020 FINDINGS: CTA NECK FINDINGS Aortic arch: Atheromatous plaque. Right carotid system: Atheromatous wall thickening diffusely in the common carotid and ICA. There is a stent spanning the bifurcation which is patent. No downstream beading or dissection Left carotid system: Chronic occlusion beginning just after the common carotid origin with irregular calcified plaque. There is reconstitution at the bifurcation with narrowing less intensely enhancing left ICA that is patent into the skull. Vertebral arteries: Atheromatous plaque in the bilateral subclavian artery. Dominant right vertebral artery which is widely patent to the dura. No flow seen at the left V1 segment, but there is reconstitution and flow seen by the basilar. Skeleton: Advanced cervical spine degeneration. No acute or aggressive finding. Other neck: No acute finding. Upper chest: Emphysema. Scattered pulmonary nodules, most notably at nodule measuring 15 mm at the left apex which appears to invade the adjacent pleura. Review of the MIP images confirms the above findings CTA HEAD FINDINGS Anterior circulation: Heavily calcified bilateral carotid siphons and petrous segments. The segments are recently evaluated by catheter angiography which is better due  to the degree of artifact from skull base and calcified plaque. No emergent branch occlusion or aneurysm. Posterior circulation: Right vertebral artery dominance. Left V4 segment calcified plaque. Basilar artery is widely patent. Fetal type left PCA flow, which may be acquired. Venous sinuses: Diffusely patent Anatomic variants: As above Review of the MIP images confirms the above findings These results were communicated to Dr Erlinda Hong at 12:47 pmon 8/21/2021by text page via the [facility] messaging system. IMPRESSION: 1. No emergent vascular finding. No change from CTA and catheter angiogram earlier this month. 2. 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 3. Diffusely patent right cervical carotid stent. 4. Pulmonary nodules in the apical lungs including a 15 mm left apical subpleural nodule which appears to invade the pleura, primarily worrisome for malignancy/metastatic disease. Recommend dedicated chest CT. Electronically Signed   By: Monte Fantasia M.D.   On: 08/14/2020 12:49   CT ANGIO NECK W OR WO CONTRAST  Result Date: 08/14/2020 CLINICAL DATA:  Code stroke with right-sided weakness. EXAM: CT ANGIOGRAPHY HEAD AND NECK TECHNIQUE: Multidetector CT imaging of the head and neck was performed using the standard protocol during bolus administration of intravenous contrast. Multiplanar CT image reconstructions and MIPs were obtained to evaluate the vascular anatomy. Carotid  stenosis measurements (when applicable) are obtained utilizing NASCET criteria, using the distal internal carotid diameter as the denominator. CONTRAST:  9mL OMNIPAQUE IOHEXOL 350 MG/ML SOLN COMPARISON:  08/02/2020 FINDINGS: CTA NECK FINDINGS Aortic arch: Atheromatous plaque. Right carotid system: Atheromatous wall thickening diffusely in the common carotid and ICA. There is a stent spanning the bifurcation which is patent. No downstream beading or dissection Left carotid system: Chronic occlusion beginning just  after the common carotid origin with irregular calcified plaque. There is reconstitution at the bifurcation with narrowing less intensely enhancing left ICA that is patent into the skull. Vertebral arteries: Atheromatous plaque in the bilateral subclavian artery. Dominant right vertebral artery which is widely patent to the dura. No flow seen at the left V1 segment, but there is reconstitution and flow seen by the basilar. Skeleton: Advanced cervical spine degeneration. No acute or aggressive finding. Other neck: No acute finding. Upper chest: Emphysema. Scattered pulmonary nodules, most notably at nodule measuring 15 mm at the left apex which appears to invade the adjacent pleura. Review of the MIP images confirms the above findings CTA HEAD FINDINGS Anterior circulation: Heavily calcified bilateral carotid siphons and petrous segments. The segments are recently evaluated by catheter angiography which is better due to the degree of artifact from skull base and calcified plaque. No emergent branch occlusion or aneurysm. Posterior circulation: Right vertebral artery dominance. Left V4 segment calcified plaque. Basilar artery is widely patent. Fetal type left PCA flow, which may be acquired. Venous sinuses: Diffusely patent Anatomic variants: As above Review of the MIP images confirms the above findings These results were communicated to Dr Erlinda Hong at 12:47 pmon 8/21/2021by text page via the [facility] messaging system. IMPRESSION: 1. No emergent vascular finding. No change from CTA and catheter angiogram earlier this month. 2. 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 3. Diffusely patent right cervical carotid stent. 4. Pulmonary nodules in the apical lungs including a 15 mm left apical subpleural nodule which appears to invade the pleura, primarily worrisome for malignancy/metastatic disease. Recommend dedicated chest CT. Electronically Signed   By: Monte Fantasia M.D.   On: 08/14/2020  12:49   CT HEAD CODE STROKE WO CONTRAST  Result Date: 08/14/2020 CLINICAL DATA:  Code stroke. Right-sided weakness and speech difficulty EXAM: CT HEAD WITHOUT CONTRAST TECHNIQUE: Contiguous axial images were obtained from the base of the skull through the vertex without intravenous contrast. COMPARISON:  08/02/2020 CT.  Brain MRI 08/02/2020 FINDINGS: Brain: Expected evolution of left occipital and frontal parietal convexity infarct which have become more low-density. Large remote right parietal infarct. Remote left perforator infarct at the basal ganglia. Chronic small vessel ischemic gliosis in the white matter. No hemorrhage, hydrocephalus, or masslike finding. Vascular: Negative Skull: Negative Sinuses/Orbits: Chronic right maxillary sinusitis. Other: These results were communicated to Xu at 11:58 amon 8/21/2021by text page via the [facility] messaging system. ASPECTS [facility] Stroke Program Early CT Score) Not scored in this setting IMPRESSION: 1. No acute finding when compared with prior. 2. Expected evolution of recent left occipital and border zone infarcts. 3. Remote right parietal infarct. Electronically Signed   By: Monte Fantasia M.D.   On: 08/14/2020 12:00    Impression: Suspected upper GI bleeding in the setting of Plavix and aspirin use: Melena and anemia.  Last dose of Plavix 8:27 a.m. today 8/23.   -Hemoglobin downtrending.  8.9 today, improved from 7.0 yesterday after 2u pRBCs -BUN remains normal (11) with Cr 0.62 -Platelets WNL (211)  Recent CVA, on  75 mg Plavix and 325 mg aspirin  Plan: EGD tomorrow with Dr. Paulita Fujita.  I thoroughly discussed the procedure with the patient and the patient's son, nature, alternatives (observation/PPI), benefits, and risks (including but not limited to bleeding, infection, perforation, anesthesia/cardiac and pulmonary complications).  Patient and patient's son verbalized understanding gave verbal consent to proceed with EGD tomorrow.  NPO after  midnight.  Hold Plavix for now (okay per Dr. Letta Pate).    Protonix 40 mg IV twice daily.  Continue monitor H&H with transfusion as needed to maintain hemoglobin greater than 7.  [facility] will follow.   LOS: 11 days   Salley Slaughter  PA-C 08/16/2020, 11:27 AM  Contact #  [phone]

## 2020-08-16 NOTE — Progress Notes (Signed)
STROKE TEAM PROGRESS NOTE   INTERVAL HISTORY Son Denial at bedside. Pt sitting in chair, reclining position, neuro stable.  BP overnight 150s. Hb 8.9 today improved. GI on board, holding plavix, preparing for EGD in am. CT chest showed potential metastasis lung and liver cancer. Discussed with Dr. Letta Pate.   OBJECTIVE Vitals:   08/15/20 1819 08/15/20 1930 08/15/20 2126 08/16/20 0422  BP: 117/66 (!) 189/76 (!) 158/84 (!) 150/75  Pulse: 93 70 81 78  Resp:  18 20 18   Temp:  98.7 F (37.1 C) (!) 97.4 F (36.3 C) 97.7 F (36.5 C)  TempSrc:  Oral Oral Oral  SpO2: 100% 97% 97% 96%   CBC:  Recent Labs  Lab 08/13/20 0840 08/13/20 1237 08/15/20 0507 08/16/20 0532  WBC 5.8   < > 5.9 6.6  NEUTROABS 4.0  --   --  4.6  HGB 7.6*   < > 7.0* 8.9*  HCT 27.1*   < > 24.9* 29.8*  MCV 75.3*   < > 74.8* 77.8*  PLT 228   < > 236 211   < > = values in this interval not displayed.   Basic Metabolic Panel:  Recent Labs  Lab 08/15/20 0507 08/16/20 0529  NA 136 138  K 3.6 3.6  CL 106 106  CO2 23 23  GLUCOSE 111* 103*  BUN 8 11  CREATININE 0.60* 0.62  CALCIUM 8.3* 8.2*    Lipid Panel:     Component Value Date/Time   CHOL 102 08/03/2020 0624   TRIG 98 08/03/2020 0624   HDL 35 (L) 08/03/2020 0624   CHOLHDL 2.9 08/03/2020 0624   VLDL 20 08/03/2020 0624   LDLCALC 47 08/03/2020 0624   HgbA1c:  Lab Results  Component Value Date   HGBA1C 6.0 (H) 08/03/2020   Urine Drug Screen:     Component Value Date/Time   LABOPIA NONE DETECTED 08/02/2020 2015   COCAINSCRNUR NONE DETECTED 08/02/2020 2015   LABBENZ NONE DETECTED 08/02/2020 2015   AMPHETMU NONE DETECTED 08/02/2020 2015   THCU NONE DETECTED 08/02/2020 2015   LABBARB NONE DETECTED 08/02/2020 2015    Alcohol Level     Component Value Date/Time   ETH <10 08/02/2020 2015    IMAGING CT HEAD CODE STROKE WO CONTRAST 08/14/2020 IMPRESSION:  1. No acute finding when compared with prior.  2. Expected evolution of recent left  occipital and border zone infarcts.  3. Remote right parietal infarct.   CT ANGIO HEAD W OR WO CONTRAST CT ANGIO NECK W OR WO CONTRAST 08/14/2020 IMPRESSION:  1. No emergent vascular finding. No change from CTA and catheter angiogram earlier this month.  2. 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  3. Diffusely patent right cervical carotid stent.  4. Pulmonary nodules in the apical lungs including a 15 mm left apical subpleural nodule which appears to invade the pleura, primarily worrisome for malignancy/metastatic disease. Recommend dedicated chest CT.   Transthoracic Echocardiogram  08/04/2020 IMPRESSIONS  1. Left ventricular ejection fraction, by estimation, is 55 to 60%. The left ventricle has normal function. The left ventricle demonstrates regional wall motion abnormalities with possible basal inferior hypokinesis. There is mild left ventricular hypertrophy. Left ventricular diastolic parameters are consistent with Grade I diastolic dysfunction (impaired relaxation).  2. Right ventricular systolic function is normal. The right ventricular size is normal. Tricuspid regurgitation signal is inadequate for assessing PA pressure.  3. The mitral valve is normal in structure. No evidence of mitral valve regurgitation. No  evidence of mitral stenosis.  4. The aortic valve is tricuspid. Aortic valve regurgitation is not visualized. No aortic stenosis is present.  5. The inferior vena cava is normal in size with greater than 50% respiratory variability, suggesting right atrial pressure of 3 mmHg.  6. Technically difficult study with poor acoustic windows.   ECG - 08/14/20 - SR rate 79 BPM. (See cardiology reading for complete details)   PHYSICAL EXAM    Temp:  [97.4 F (36.3 C)-98.9 F (37.2 C)] 97.7 F (36.5 C) (08/23 0422) Pulse Rate:  [66-93] 78 (08/23 0422) Resp:  [15-20] 18 (08/23 0422) BP: (114-189)/(60-84) 150/75 (08/23 0422) SpO2:  [95  %-100 %] 96 % (08/23 0422)  General - Well nourished, well developed, in no apparent distress.  Ophthalmologic - fundi not visualized due to noncooperation.  Cardiovascular - Regular rhythm and rate.  Mental Status -  Level of arousal and orientation to month and person were intact. However, not orientated to place, year, or age.  Language including expression, naming, repetition, comprehension was assessed and found intact. Mild dysarthria  Cranial Nerves II - XII - II - legally blind bilaterally, right hemianopia, left visual field about to have LP and HW only. III, IV, VI - Extraocular movements intact. V - Facial sensation intact bilaterally. VII - Facial movement intact bilaterally. VIII - Hearing & vestibular intact bilaterally. X - Palate elevates symmetrically. XI - Chin turning & shoulder shrug intact bilaterally. XII - Tongue protrusion intact.  Motor Strength - The patient's strength was normal in all extremities with slight pronator drift on the right.  Bulk was normal and fasciculations were absent.   Motor Tone - Muscle tone was assessed at the neck and appendages and was normal.  Reflexes - The patient's reflexes were symmetrical in all extremities and he had no pathological reflexes.  Sensory - Light touch, temperature/pinprick were assessed and were symmetrical.    Coordination - The patient had normal movements in the hands with no ataxia or dysmetria.  Tremor was absent.  Gait and Station - deferred.   ASSESSMENT/PLAN [name] is a 64 y.o. 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 He did not receive IV t-PA due to recent stroke and improving deficits.   TIA in the setting of orthostatic hypotension and severe anemia with left CCA  occlusion  Left MCA syndrome with low BP and anemia 8/21 -> resolved  CT no acute finding with chronic infarcts  CTA head and neck similar as before with left CCA occlusion  Received IVF bolus and infusion  Received albumin x 2 doses  Orthostatic vital sitting 135/76 and standing 106/71 - put on midodrine  Hb 8.0->7.0 - received 2u PRBC -> 8.9  Continue BP and CBC monitoring  Recent stroke 08/02/2020 - left posterior MCA and left PCA infarcts, secondary to left CCA occlusion, large vessel disease source  CT showed left parieto-occipital acute infarct, and old right CR infarct.   CTA head and neck showed left CCA possible occlusion, and left ICA reconstitution with slow flow. Right ICA stent patent, bilateral siphon high-grade stenosis. Left P2 high-grade stenosis.   CTP 33 cc core with 45 penumbra.  MRI  Acute infarcts involving left posterior greater than anterior circulations, noting presence of a fetal left PCA on the prior CTA  IR left CCA mid to distal occlusion, left MCA and ACA patent, right ICA stent patent.  2D Echo EF 55 to 60%  LDL 47  HgbA1c 6.0  Lovenox for VTE prophylaxis  No antithrombotic prior to admission, now on aspirin 325 mg daily. clopidogrel 75 mg on hold for EGD tomorrow   Carotid stenosis  08/2019 cerebral angiogram showed right ICA 90% stenosis, left CCA 50% stenosis, left ICA 50% stenosis.  Left fetal PCA.  Left VA occluded with distal reconstitution  09/2019 status post right carotid stenting with Dr. Estanislado Pandy  This admission CTA head neck left CCA occlusion, right ICA stent patent  This admission cerebral angiogram left CCA mid to distal occlusion, right ICA stent patent  No endovascular procedure indicated, continue medical management.  Long-term BP goal 130-150  Orthostatic hypotension Hx of hypertension  Still positive for orthostatic hypotension  On bedrest  Orthostatic vitals showed sitting 135/76, standing 106/71    On TED hose  On gentle hydration - NS @ 50  On midodrine now 5mg  tid  Severe anemia GIB in setting of aspirin and plavix use  Hb 8.0->7.0  Stool occult blood POSITIVE  Received PRBC 2U transfusion  CBC showed microcytic anemia  On iron pills   Monitoring CBC  GI consulted  Added PPI, hold off plavix  EGD 8/24   Hyperlipidemia  Home meds: Lipitor 40  LDL 47, goal < 70  Now on Lipitor 80  Continue statin at discharge  Lung and liver metastasis    CTA neck showed Pulmonary nodules in the apical lungs including a 15 mm left apical subpleural nodule which appears to invade the pleura, primarily worrisome for malignancy/metastatic disease.   CT chest -innumerable pulmonary nodules largest at pleural-based 1.8 cm, compatible with pulmonary metastasis.  Indistinct hypodense masslike 8.4 cm left liver dome focus, new, suspicious for liver metastasis.  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 He expressed understanding and appreciation. I also discussed with Dr. Letta Pate.  Rosalin Hawking, MD PhD Stroke Neurology 08/16/2020 12:54 PM   To contact Stroke Continuity provider, please refer to [email or web address] After hours, contact General Neurology

## 2020-08-16 NOTE — Progress Notes (Signed)
Physical Therapy Session Note  Patient Details  Name: Nathan Hart MRN: [id number] Date of Birth: 1956-02-09  Today's Date: 08/16/2020 PT Individual Time: 0800-0915 PT Individual Time Calculation (min): 75 min   Short Term Goals: Week 1:  PT Short Term Goal 1 (Week 1): STGs=LTGs  Skilled Therapeutic Interventions/Progress Updates:    Therapist spoke with PA Dan, receiving verbal confirmation for appropriateness to participate in OOB activities therapies. Pt received supine (HOB 30deg) in bed, initially frustrated with therapist and adamant on leaving to go home. Educated patient on importance of being both medically AND physically stable prior to DC; pt still not understanding why he can't leave. After a lot of effort for calming and redirection, pt hesitant but agreeable to PT. Full orthostatics assessed (see vitals section below). Pt is extremely poor historian and provides unreliable/inconsistent subjective history, denies lightheadedness/dizziness throughout positional changes however after ~144ft of gait, pt stated he felt like he could have passed out (only informed therapist when asked). Pt performed sit<>supine with supervision, sit<>stand with CGA and RW. Transported to therapy gym in w/c where he ambulated 125ft with minA and RW, decreased R step length and difficulty maintaining RUE grasp to RW. Performed car transfer with minA and no AD, cues for using BUE's to guide vision to car and VC for correct sequencing and technique. Pt continues to have difficulty with motor planning, especially when provided 2-3 sequential steps. Pt then performed 1x10 alternating toe taps on 6inch block with minA and no AD, increased difficulty navigating RLE with accurate foot placement despite cues and guidance. Pt then navigated up/down x4 steps with minA and BHR support, step-to sequencing, increased difficulty with descent > ascent however no LOB or knee buckling. Pt transported back to room, stand-pivot  with minA and no AD to his recliner where he remained seated at end of session, needs in reach, seat belt alarm on, RN notified.   Therapy Documentation Precautions:  Precautions Precautions: Fall Precaution Comments: legally blind Restrictions Weight Bearing Restrictions: No Vital Signs:   Orthostatics: HOB 30deg: 155/75 (97) HR 86 Seated EOB: 140/73 (91) HR 92 Standing: 121/73 (86) HR 109 2-3 minutes of standing: 119/77 (90) HR 116 After ~49ft of gait: 115/77 (88) HR 126  Pain: Pain Assessment Pain Scale: 0-10 Pain Score: 0-No pain  Therapy/Group: Individual Therapy   [name] PT 08/16/2020, 10:33 AM

## 2020-08-16 NOTE — Progress Notes (Signed)
Patient ID: Nathan Hart, male   DOB: 11-May-1956, 64 y.o.   MRN: [id number]  This SW covering for assigned [place].   SW received updates from medical team informing pt will not d/c today. SW spoke with pt son [name] [phone]) and ex wife Laverta [place] [phone]) in conference call to discuss above. Pt son [name] was aware on delay in discharge. Both understand there will be updates once there is more information.   Loralee Pacas, MSW, [place] Office: [phone] Cell: [phone] Fax: [phone]

## 2020-08-16 NOTE — Consult Note (Signed)
Referring Provider: Dr. Letta Pate Primary Care Physician:  Patient, No Pcp Per Primary Gastroenterologist:  Althia Forts  Reason for Consultation:  Melena, anemia  HPI: Nathan Hart is a 64 y.o. 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  Patient is legally blind, and thus states he is not aware of the color of his bowel movements.  Overnight, he had one liquid melenic stool.  No other melenic bowel movements noted.  No hematochezia noted per flow chart.  Patient reports recent decrease in appetite and some weight loss during his recent hospitalization, though he is unable to quantify amount of weight he has lost.  Denies any nausea, vomiting, hematemesis, dysphagia, GERD, abdominal pain, constipation.  Patient states he believes he had a stomach ulcer in the past, though he cannot recall whether or not this was diagnosed via EGD.  States he has never had a colonoscopy.  Patient is currently on Plavix, as well as your 325 mg daily aspirin due to recent stroke.  Patient denies any family history of colon cancer or gastrointestinal malignancy.  Past Medical History:  Diagnosis Date  . Abnormal peripheral vision of left eye   . Blind right eye   . COPD (chronic obstructive pulmonary disease) (HCC)     home o2 hs and prn  . Dyspnea    with activity  . Empyema lung ([place])   . ETOH abuse   . HTN (hypertension)   . Peripheral vascular disease ([place])   . Pneumonia 2020  . Stroke [facility])     Past Surgical History:  Procedure Laterality Date  . BACK SURGERY     disc lumar x 2   . CLAVICLE SURGERY Right   . IR ANGIO INTRA EXTRACRAN SEL COM CAROTID INNOMINATE BILAT MOD SED  09/12/2019  . IR ANGIO INTRA EXTRACRAN SEL COM CAROTID INNOMINATE BILAT MOD SED  08/03/2020  . IR ANGIO VERTEBRAL SEL SUBCLAVIAN  INNOMINATE UNI L MOD SED  09/12/2019  . IR ANGIO VERTEBRAL SEL VERTEBRAL UNI R MOD SED  09/12/2019  . IR ANGIO VERTEBRAL SEL VERTEBRAL UNI R MOD SED  08/03/2020  . IR ANGIOGRAM EXTREMITY LEFT  08/03/2020  . IR INTRAVSC STENT CERV CAROTID W/EMB-PROT MOD SED INCL ANGIO  09/24/2019  . IR US GUIDE VASC ACCESS RIGHT  09/12/2019  . IR US GUIDE VASC ACCESS RIGHT  08/03/2020  . KNEE SURGERY Right     x 3 ACL  . RADIOLOGY WITH ANESTHESIA N/A 09/24/2019   Procedure: IR WITH ANESTHESIA/STENTING;  Surgeon: Luanne Bras, MD;  Location: [facility];  Service: Radiology;  Laterality: N/A;  . [place]    Prior to Admission medications   Medication Sig Start Date End Date Taking? Authorizing Provider  acetaminophen (TYLENOL) 325 MG tablet Take 650 mg by mouth every 6 (six) hours as needed for moderate pain or headache.    [provider]  albuterol (PROVENTIL) (2.5 MG/3ML) 0.083% nebulizer solution Take 3 mLs (2.5 mg total) by nebulization every 6 (six) hours as needed for wheezing or shortness of breath. For breathing 09/09/19   Roxan Hockey, MD  aspirin EC 325 MG EC tablet Take 1 tablet (325 mg total) by mouth daily. 08/06/20   Terrilee Croak, MD  atorvastatin (LIPITOR) 40 MG tablet Take 1 tablet (40 mg total) by mouth every evening. For  Cholesterol 09/09/19   Roxan Hockey, MD  carvedilol (COREG) 3.125 MG tablet Take 1 tablet (3.125 mg total) by mouth 2 (two) times daily with a meal. 08/05/20   Dahal, Marlowe Aschoff, MD  clopidogrel (PLAVIX) 75 MG tablet Take 1 tablet (75 mg total) by mouth daily. 08/06/20 11/05/20  Terrilee Croak, MD  diphenhydrAMINE HCl, Sleep, (ZZZQUIL) 25 MG CAPS Take 25 mg by mouth at bedtime as needed (sleep).    [provider]  folic acid (FOLVITE) 1 MG tablet Take 1 tablet (1 mg total) by mouth daily. 09/10/19   Roxan Hockey, MD  Multiple Vitamin (MULTIVITAMIN WITH MINERALS) TABS tablet Take 1 tablet by mouth daily. 08/06/20   Dahal, Marlowe Aschoff, MD   senna-docusate (SENOKOT-S) 8.6-50 MG tablet Take 1 tablet by mouth at bedtime as needed for moderate constipation. 08/05/20   Terrilee Croak, MD  thiamine 100 MG tablet Take 1 tablet (100 mg total) by mouth daily. 08/06/20   Terrilee Croak, MD    Scheduled Meds: . aspirin EC  325 mg Oral Daily   Or  . aspirin  300 mg Rectal Daily  . atorvastatin  80 mg Oral Daily  . ferrous sulfate  325 mg Oral TID WC  . folic acid  1 mg Oral Daily  . midodrine  5 mg Oral TID  . multivitamin with minerals  1 tablet Oral Daily  . pantoprazole (PROTONIX) IV  40 mg Intravenous Q12H  . thiamine  100 mg Oral Daily   Or  . thiamine  100 mg Intravenous Daily   Continuous Infusions: . sodium chloride 50 mL/hr at 08/16/20 0300   PRN Meds:.acetaminophen **OR** acetaminophen (TYLENOL) oral liquid 160 mg/5 mL **OR** acetaminophen, senna-docusate, sorbitol  Allergies as of 08/05/2020  . (No Known Allergies)    Family History  Problem Relation Age of Onset  . Cancer Mother     Social History   Socioeconomic History  . Marital status: Legally Separated    Spouse name: Not on file  . Number of children: Not on file  . Years of education: Not on file  . Highest education level: Not on file  Occupational History  . Not on file  Tobacco Use  . Smoking status: Current Every Day Smoker    Packs/day: 1.50    Years: 9.00    Pack years: 13.50    Types: Cigarettes  . Smokeless tobacco: Never Used  Vaping Use  . Vaping Use: Never used  Substance and Sexual Activity  . Alcohol use: Yes    Alcohol/week: 20.0 standard drinks    Types: 20 Cans of beer per week  . Drug use: Never  . Sexual activity: Not on file  Other Topics Concern  . Not on file  Social History Narrative  . Not on file   Social Determinants of Health   Financial Resource Strain:   . Difficulty of Paying Living Expenses: Not on file  Food Insecurity:   . Worried About Charity fundraiser in the Last Year: Not on file  . Ran Out of  Food in the Last Year: Not on file  Transportation Needs:   . Lack of Transportation (Medical): Not on file  . Lack of Transportation (Non-Medical): Not on file  Physical Activity:   . Days of Exercise per Week: Not on file  . Minutes of Exercise per Session: Not on file  Stress:   . Feeling of Stress : Not on file  Social Connections:   . Frequency of Communication with  Friends and Family: Not on file  . Frequency of Social Gatherings with Friends and Family: Not on file  . Attends Religious Services: Not on file  . Active Member of Clubs or Organizations: Not on file  . Attends Archivist Meetings: Not on file  . Marital Status: Not on file  Intimate Partner Violence:   . Fear of Current or Ex-Partner: Not on file  . Emotionally Abused: Not on file  . Physically Abused: Not on file  . Sexually Abused: Not on file    Review of Systems: Review of Systems  Constitutional: Positive for malaise/fatigue and weight loss. Negative for chills and fever.  HENT: Negative for hearing loss and tinnitus.   Eyes:       Legally blind  Respiratory: Positive for shortness of breath. Negative for cough.   Cardiovascular: Negative for chest pain and palpitations.  Gastrointestinal: Positive for diarrhea and melena. Negative for abdominal pain, blood in stool, constipation, heartburn, nausea and vomiting.  Genitourinary: Negative for hematuria and urgency.  Musculoskeletal: Negative for back pain and neck pain.  Skin: Negative for itching and rash.  Neurological: Positive for weakness. Negative for seizures.  Endo/Heme/Allergies: Negative for polydipsia. Does not bruise/bleed easily.  Psychiatric/Behavioral: Positive for substance abuse (?ETOH per chart review). The patient is not nervous/anxious.     Physical Exam: Vital signs: Vitals:   08/15/20 2126 08/16/20 0422  BP: (!) 158/84 (!) 150/75  Pulse: 81 78  Resp: 20 18  Temp: (!) 97.4 F (36.3 C) 97.7 F (36.5 C)  SpO2: 97%  96%   Last BM Date: 08/11/20 Physical Exam Vitals and nursing note reviewed.  Constitutional:      General: He is not in acute distress.    Comments: Son at bedside  HENT:     Head: Normocephalic and atraumatic.     Nose: Nose normal.     Mouth/Throat:     Mouth: Mucous membranes are moist.     Pharynx: Oropharynx is clear.  Eyes:     Extraocular Movements: Extraocular movements intact.     Comments: Conjunctival pallor  Cardiovascular:     Rate and Rhythm: Normal rate and regular rhythm.     Pulses: Normal pulses.     Heart sounds: Normal heart sounds.  Pulmonary:     Effort: Pulmonary effort is normal.     Breath sounds: Normal breath sounds.  Abdominal:     General: Abdomen is flat. Bowel sounds are normal. There is no distension.     Palpations: Abdomen is soft. There is no mass.     Tenderness: There is no abdominal tenderness. There is no guarding or rebound.     Hernia: No hernia is present.  Musculoskeletal:        General: No swelling or tenderness.     Cervical back: Normal range of motion and neck supple.  Skin:    General: Skin is warm and dry.  Neurological:     General: No focal deficit present.     Mental Status: He is oriented to person, place, and time. He is lethargic.  Psychiatric:        Attention and Perception: Attention normal.        Mood and Affect: Affect is flat.        Thought Content: Thought content normal.        Judgment: Judgment normal.      GI:  Lab Results: Recent Labs    08/14/20 1127 08/15/20 0507 08/16/20  0532  WBC 7.0 5.9 6.6  HGB 8.0* 7.0* 8.9*  HCT 27.6* 24.9* 29.8*  PLT 259 236 211   BMET Recent Labs    08/14/20 1127 08/15/20 0507 08/16/20 0529  NA 139 136 138  K 3.7 3.6 3.6  CL 104 106 106  CO2 22 23 23   GLUCOSE 153* 111* 103*  BUN 14 8 11   CREATININE 0.71 0.60* 0.62  CALCIUM 8.9 8.3* 8.2*   LFT No results for input(s): PROT, ALBUMIN, AST, ALT, ALKPHOS, BILITOT, BILIDIR, IBILI in the last 72  hours. PT/INR No results for input(s): LABPROT, INR in the last 72 hours.   Studies/Results: CT ANGIO HEAD W OR WO CONTRAST  Result Date: 08/14/2020 CLINICAL DATA:  Code stroke with right-sided weakness. EXAM: CT ANGIOGRAPHY HEAD AND NECK TECHNIQUE: Multidetector CT imaging of the head and neck was performed using the standard protocol during bolus administration of intravenous contrast. Multiplanar CT image reconstructions and MIPs were obtained to evaluate the vascular anatomy. Carotid stenosis measurements (when applicable) are obtained utilizing NASCET criteria, using the distal internal carotid diameter as the denominator. CONTRAST:  90mL OMNIPAQUE IOHEXOL 350 MG/ML SOLN COMPARISON:  08/02/2020 FINDINGS: CTA NECK FINDINGS Aortic arch: Atheromatous plaque. Right carotid system: Atheromatous wall thickening diffusely in the common carotid and ICA. There is a stent spanning the bifurcation which is patent. No downstream beading or dissection Left carotid system: Chronic occlusion beginning just after the common carotid origin with irregular calcified plaque. There is reconstitution at the bifurcation with narrowing less intensely enhancing left ICA that is patent into the skull. Vertebral arteries: Atheromatous plaque in the bilateral subclavian artery. Dominant right vertebral artery which is widely patent to the dura. No flow seen at the left V1 segment, but there is reconstitution and flow seen by the basilar. Skeleton: Advanced cervical spine degeneration. No acute or aggressive finding. Other neck: No acute finding. Upper chest: Emphysema. Scattered pulmonary nodules, most notably at nodule measuring 15 mm at the left apex which appears to invade the adjacent pleura. Review of the MIP images confirms the above findings CTA HEAD FINDINGS Anterior circulation: Heavily calcified bilateral carotid siphons and petrous segments. The segments are recently evaluated by catheter angiography which is better due  to the degree of artifact from skull base and calcified plaque. No emergent branch occlusion or aneurysm. Posterior circulation: Right vertebral artery dominance. Left V4 segment calcified plaque. Basilar artery is widely patent. Fetal type left PCA flow, which may be acquired. Venous sinuses: Diffusely patent Anatomic variants: As above Review of the MIP images confirms the above findings These results were communicated to Dr Erlinda Hong at 12:47 pmon 8/21/2021by text page via the [facility] messaging system. IMPRESSION: 1. No emergent vascular finding. No change from CTA and catheter angiogram earlier this month. 2. 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 3. Diffusely patent right cervical carotid stent. 4. Pulmonary nodules in the apical lungs including a 15 mm left apical subpleural nodule which appears to invade the pleura, primarily worrisome for malignancy/metastatic disease. Recommend dedicated chest CT. Electronically Signed   By: Monte Fantasia M.D.   On: 08/14/2020 12:49   CT ANGIO NECK W OR WO CONTRAST  Result Date: 08/14/2020 CLINICAL DATA:  Code stroke with right-sided weakness. EXAM: CT ANGIOGRAPHY HEAD AND NECK TECHNIQUE: Multidetector CT imaging of the head and neck was performed using the standard protocol during bolus administration of intravenous contrast. Multiplanar CT image reconstructions and MIPs were obtained to evaluate the vascular anatomy. Carotid  stenosis measurements (when applicable) are obtained utilizing NASCET criteria, using the distal internal carotid diameter as the denominator. CONTRAST:  21mL OMNIPAQUE IOHEXOL 350 MG/ML SOLN COMPARISON:  08/02/2020 FINDINGS: CTA NECK FINDINGS Aortic arch: Atheromatous plaque. Right carotid system: Atheromatous wall thickening diffusely in the common carotid and ICA. There is a stent spanning the bifurcation which is patent. No downstream beading or dissection Left carotid system: Chronic occlusion beginning just  after the common carotid origin with irregular calcified plaque. There is reconstitution at the bifurcation with narrowing less intensely enhancing left ICA that is patent into the skull. Vertebral arteries: Atheromatous plaque in the bilateral subclavian artery. Dominant right vertebral artery which is widely patent to the dura. No flow seen at the left V1 segment, but there is reconstitution and flow seen by the basilar. Skeleton: Advanced cervical spine degeneration. No acute or aggressive finding. Other neck: No acute finding. Upper chest: Emphysema. Scattered pulmonary nodules, most notably at nodule measuring 15 mm at the left apex which appears to invade the adjacent pleura. Review of the MIP images confirms the above findings CTA HEAD FINDINGS Anterior circulation: Heavily calcified bilateral carotid siphons and petrous segments. The segments are recently evaluated by catheter angiography which is better due to the degree of artifact from skull base and calcified plaque. No emergent branch occlusion or aneurysm. Posterior circulation: Right vertebral artery dominance. Left V4 segment calcified plaque. Basilar artery is widely patent. Fetal type left PCA flow, which may be acquired. Venous sinuses: Diffusely patent Anatomic variants: As above Review of the MIP images confirms the above findings These results were communicated to Dr Erlinda Hong at 12:47 pmon 8/21/2021by text page via the [facility] messaging system. IMPRESSION: 1. No emergent vascular finding. No change from CTA and catheter angiogram earlier this month. 2. 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 3. Diffusely patent right cervical carotid stent. 4. Pulmonary nodules in the apical lungs including a 15 mm left apical subpleural nodule which appears to invade the pleura, primarily worrisome for malignancy/metastatic disease. Recommend dedicated chest CT. Electronically Signed   By: Monte Fantasia M.D.   On: 08/14/2020  12:49   CT HEAD CODE STROKE WO CONTRAST  Result Date: 08/14/2020 CLINICAL DATA:  Code stroke. Right-sided weakness and speech difficulty EXAM: CT HEAD WITHOUT CONTRAST TECHNIQUE: Contiguous axial images were obtained from the base of the skull through the vertex without intravenous contrast. COMPARISON:  08/02/2020 CT.  Brain MRI 08/02/2020 FINDINGS: Brain: Expected evolution of left occipital and frontal parietal convexity infarct which have become more low-density. Large remote right parietal infarct. Remote left perforator infarct at the basal ganglia. Chronic small vessel ischemic gliosis in the white matter. No hemorrhage, hydrocephalus, or masslike finding. Vascular: Negative Skull: Negative Sinuses/Orbits: Chronic right maxillary sinusitis. Other: These results were communicated to Xu at 11:58 amon 8/21/2021by text page via the [facility] messaging system. ASPECTS [facility] Stroke Program Early CT Score) Not scored in this setting IMPRESSION: 1. No acute finding when compared with prior. 2. Expected evolution of recent left occipital and border zone infarcts. 3. Remote right parietal infarct. Electronically Signed   By: Monte Fantasia M.D.   On: 08/14/2020 12:00    Impression: Suspected upper GI bleeding in the setting of Plavix and aspirin use: Melena and anemia.  Last dose of Plavix 8:27 a.m. today 8/23.   -Hemoglobin downtrending.  8.9 today, improved from 7.0 yesterday after 2u pRBCs -BUN remains normal (11) with Cr 0.62 -Platelets WNL (211)  Recent CVA, on  75 mg Plavix and 325 mg aspirin  Plan: EGD tomorrow with Dr. Paulita Fujita.  I thoroughly discussed the procedure with the patient and the patient's son, nature, alternatives (observation/PPI), benefits, and risks (including but not limited to bleeding, infection, perforation, anesthesia/cardiac and pulmonary complications).  Patient and patient's son verbalized understanding gave verbal consent to proceed with EGD tomorrow.  NPO after  midnight.  Hold Plavix for now (okay per Dr. Letta Pate).    Protonix 40 mg IV twice daily.  Continue monitor H&H with transfusion as needed to maintain hemoglobin greater than 7.  [facility] will follow.   LOS: 11 days   Salley Slaughter  PA-C 08/16/2020, 11:27 AM  Contact #  [phone]

## 2020-08-17 ENCOUNTER — Inpatient Hospital Stay (HOSPITAL_COMMUNITY): Payer: Medicare Other

## 2020-08-17 ENCOUNTER — Inpatient Hospital Stay (HOSPITAL_COMMUNITY): Payer: Medicare Other | Admitting: Anesthesiology

## 2020-08-17 ENCOUNTER — Encounter (HOSPITAL_COMMUNITY): Payer: Self-pay | Admitting: Physical Medicine & Rehabilitation

## 2020-08-17 ENCOUNTER — Encounter (HOSPITAL_COMMUNITY)
Admission: RE | Disposition: A | Discharge status: Hospice | Payer: Self-pay | Source: Intra-hospital | Attending: Physical Medicine & Rehabilitation

## 2020-08-17 ENCOUNTER — Inpatient Hospital Stay (HOSPITAL_COMMUNITY): Payer: Medicare Other | Admitting: Occupational Therapy

## 2020-08-17 DIAGNOSIS — K269 Duodenal ulcer, unspecified as acute or chronic, without hemorrhage or perforation: Secondary | ICD-10-CM

## 2020-08-17 DIAGNOSIS — I639 Cerebral infarction, unspecified: Secondary | ICD-10-CM

## 2020-08-17 HISTORY — PX: ESOPHAGOGASTRODUODENOSCOPY: SHX5428

## 2020-08-17 LAB — CBC WITH DIFFERENTIAL/PLATELET
Abs Immature Granulocytes: 0 10*3/uL (ref 0.00–0.07)
Abs Immature Granulocytes: 0 10*3/uL (ref 0.00–0.07)
Basophils Absolute: 0.1 10*3/uL (ref 0.0–0.1)
Basophils Absolute: 0.1 10*3/uL (ref 0.0–0.1)
Basophils Relative: 1 %
Basophils Relative: 1 %
Eosinophils Absolute: 0.2 10*3/uL (ref 0.0–0.5)
Eosinophils Absolute: 0.1 10*3/uL (ref 0.0–0.5)
Eosinophils Relative: 3 %
Eosinophils Relative: 2 %
HCT: 28.6 % — ABNORMAL LOW (ref 39.0–52.0)
HCT: 28.1 % — ABNORMAL LOW (ref 39.0–52.0)
Hemoglobin: 8.4 g/dL — ABNORMAL LOW (ref 13.0–17.0)
Hemoglobin: 8 g/dL — ABNORMAL LOW (ref 13.0–17.0)
Lymphocytes Relative: 12 %
Lymphocytes Relative: 10 %
Lymphs Abs: 0.9 10*3/uL (ref 0.7–4.0)
Lymphs Abs: 0.7 10*3/uL (ref 0.7–4.0)
MCH: 23 pg — ABNORMAL LOW (ref 26.0–34.0)
MCH: 23.1 pg — ABNORMAL LOW (ref 26.0–34.0)
MCHC: 29.4 g/dL — ABNORMAL LOW (ref 30.0–36.0)
MCHC: 28.5 g/dL — ABNORMAL LOW (ref 30.0–36.0)
MCV: 78.4 fL — ABNORMAL LOW (ref 80.0–100.0)
MCV: 81 fL (ref 80.0–100.0)
Monocytes Absolute: 0.3 10*3/uL (ref 0.1–1.0)
Monocytes Absolute: 0 10*3/uL — ABNORMAL LOW (ref 0.1–1.0)
Monocytes Relative: 4 %
Monocytes Relative: 0 %
Neutro Abs: 5.8 10*3/uL (ref 1.7–7.7)
Neutro Abs: 5.8 10*3/uL (ref 1.7–7.7)
Neutrophils Relative %: 80 %
Neutrophils Relative %: 87 %
Platelets: 236 10*3/uL (ref 150–400)
Platelets: 238 10*3/uL (ref 150–400)
RBC: 3.65 MIL/uL — ABNORMAL LOW (ref 4.22–5.81)
RBC: 3.47 MIL/uL — ABNORMAL LOW (ref 4.22–5.81)
RDW: 21.7 % — ABNORMAL HIGH (ref 11.5–15.5)
RDW: 21.9 % — ABNORMAL HIGH (ref 11.5–15.5)
WBC: 7.3 10*3/uL (ref 4.0–10.5)
WBC: 6.7 10*3/uL (ref 4.0–10.5)
nRBC: 0 % (ref 0.0–0.2)
nRBC: 0 /100 WBC
nRBC: 0 % (ref 0.0–0.2)
nRBC: 0 /100 WBC

## 2020-08-17 LAB — BASIC METABOLIC PANEL WITH GFR
Anion gap: 10 (ref 5–15)
BUN: 10 mg/dL (ref 8–23)
CO2: 21 mmol/L — ABNORMAL LOW (ref 22–32)
Calcium: 8.2 mg/dL — ABNORMAL LOW (ref 8.9–10.3)
Chloride: 108 mmol/L (ref 98–111)
Creatinine, Ser: 0.66 mg/dL (ref 0.61–1.24)
GFR calc Af Amer: >60 mL/min
GFR calc non Af Amer: >60 mL/min
Glucose, Bld: 113 mg/dL — ABNORMAL HIGH (ref 70–99)
Potassium: 3.8 mmol/L (ref 3.5–5.1)
Sodium: 139 mmol/L (ref 135–145)

## 2020-08-17 SURGERY — EGD (ESOPHAGOGASTRODUODENOSCOPY)
Anesthesia: Monitor Anesthesia Care

## 2020-08-17 MED ORDER — PROPOFOL 500 MG/50ML IV EMUL
INTRAVENOUS | Status: DC | PRN
  Administered 2020-08-17: 100 ug/kg/min via INTRAVENOUS

## 2020-08-17 MED ORDER — ONDANSETRON HCL 4 MG/2ML IJ SOLN
INTRAMUSCULAR | Status: DC | PRN
  Administered 2020-08-17: 4 mg via INTRAVENOUS

## 2020-08-17 MED ORDER — IOHEXOL 9 MG/ML PO SOLN
500.0000 mL | ORAL | Status: AC
  Administered 2020-08-17 (×2): 500 mL via ORAL

## 2020-08-17 MED ORDER — LACTATED RINGERS IV SOLN: INTRAVENOUS | Status: DC | PRN

## 2020-08-17 MED ORDER — IOHEXOL 300 MG/ML  SOLN
100.0000 mL | Freq: Once | INTRAMUSCULAR | Status: AC | PRN
  Administered 2020-08-17: 100 mL via INTRAVENOUS

## 2020-08-17 NOTE — Progress Notes (Signed)
[place] PHYSICAL MEDICINE & REHABILITATION PROGRESS NOTE  Subjective/Complaints: Patient had EDG today. Nathan Chew, PA, discussed results with neurology Dr. Erlinda Hong who felt ASA was best choice. Patient had no complaints this morning Worked with therapy prior to EDG  ROS: limited due to language/communication   Objective: Vital Signs: Blood pressure (!) 158/76, pulse 75, temperature 97.6 F (36.4 C), resp. rate 20, SpO2 96 %. CT CHEST WO CONTRAST  Result Date: 08/16/2020 CLINICAL DATA:  COPD, oxygen dependent. Follow-up apical pulmonary nodule seen on recent neck CT angiogram. EXAM: CT CHEST WITHOUT CONTRAST TECHNIQUE: Multidetector CT imaging of the chest was performed following the standard protocol without IV contrast. COMPARISON:  08/14/2020 head and neck CT angiogram. 09/07/2019 chest CT angiogram. FINDINGS: Cardiovascular: Normal heart size. No significant pericardial effusion/thickening. Left anterior descending and left circumflex coronary atherosclerosis. Atherosclerotic nonaneurysmal thoracic aorta. Normal caliber pulmonary arteries. Mediastinum/Nodes: No discrete thyroid nodules. Unremarkable esophagus. No pathologically enlarged axillary, mediastinal or hilar lymph nodes, noting limited sensitivity for the detection of hilar adenopathy on this noncontrast study. Lungs/Pleura: No pneumothorax. No pleural effusion. Severe centrilobular emphysema with diffuse bronchial wall thickening. Innumerable (> than 20) solid pulmonary nodules scattered throughout both lungs, all new since 09/07/2019 chest CT, largest a pleural based 1.8 x 1.2 cm nodule in the medial apical left upper lobe with possible mediastinal invasion (series 5/image 21). Additional representative pulmonary nodules measure 1.0 cm in the lingula (series 5/image 86), 1.2 cm in the left lower lobe (series 5/image 106) and 0.9 cm posteriorly in right upper lobe (series 5/image 73). Complete right middle lobe atelectasis. Upper  abdomen: Indistinct hypodense masslike 8.4 cm left liver dome focus (series 3/image 141), new. Stable 3.9 cm right adrenal adenoma with density -10 HU. Partially visualized 3.3 cm anterior upper left renal simple cyst. Layering hyperdensity in the gallbladder could represent small gallstones or vicarious excretion of IV contrast. Musculoskeletal: No aggressive appearing focal osseous lesions. Moderate thoracic spondylosis. IMPRESSION: 1. Innumerable (> than 20) solid pulmonary nodules scattered throughout both lungs, all new since 09/07/2019 chest CT, largest a pleural based 1.8 cm nodule in the medial apical left upper lobe with possible associated mediastinal invasion. Findings are most compatible with pulmonary metastases, although the dominant medial apical left upper lobe nodule could represent a primary bronchogenic carcinoma. 2. Indistinct hypodense masslike 8.4 cm left liver dome focus, new, suspicious for liver metastasis. MRI abdomen without and with IV contrast recommended for further characterization. 3. Complete right middle lobe atelectasis. 4. Two-vessel coronary atherosclerosis. 5. Stable right adrenal adenoma. 6. Aortic Atherosclerosis (ICD10-I70.0) and Emphysema (ICD10-J43.9). Electronically Signed   By: Ilona Sorrel M.D.   On: 08/16/2020 12:52   Recent Labs    08/16/20 1708 08/17/20 1112  WBC 6.9 7.3  HGB 8.5* 8.4*  HCT 28.6* 28.6*  PLT 232 236   Recent Labs    08/16/20 0529 08/17/20 1112  NA 138 139  K 3.6 3.8  CL 106 108  CO2 23 21*  GLUCOSE 103* 113*  BUN 11 10  CREATININE 0.62 0.66  CALCIUM 8.2* 8.2*    Physical Exam: BP (!) 158/76 (BP Location: Right Arm)   Pulse 75   Temp 97.6 F (36.4 C)   Resp 20   SpO2 96%  General: Alert and oriented x 3, No apparent distress HEENT: Head is normocephalic, atraumatic, PERRLA, EOMI, sclera anicteric, oral mucosa pink and moist, dentition intact, ext ear canals clear,  Neck: Supple without JVD or lymphadenopathy Heart: Reg  rate and rhythm.  No murmurs rubs or gallops Chest: CTA bilaterally without wheezes, rales, or rhonchi; no distress Abdomen: Soft, non-tender, non-distended, bowel sounds positive. Extremities: No clubbing, cyanosis, or edema. Pulses are 2+ Skin: Clean and intact without signs of breakdown Neuro: Alert, following commands. Still with word finding deficits. Unable to ID basic items visually or when given verbal cues. Motor:  LUE: 4-5/5 proximal distal, stable RUE: 3 to 3+/5 shoulder abduction, elbow flexion/extension, handgrip, finger extension 1-2/5, improved today LLE: 4-5/5 proximal distal RLE: 3-4/5 proximal and distal--improved from yesterday   Assessment/Plan: 1. Functional deficits secondary to left posterior MCA/PCA infarcts which require 3+ hours per day of interdisciplinary therapy in a comprehensive inpatient rehab setting.  Physiatrist is providing close team supervision and 24 hour management of active medical problems listed below.  Physiatrist and rehab team continue to assess barriers to discharge/monitor patient progress toward functional and medical goals  Care Tool:  Bathing    Body parts bathed by patient: Right arm, Chest, Abdomen, Front perineal area, Face   Body parts bathed by helper: Left arm, Buttocks, Right upper leg, Left upper leg, Right lower leg, Left lower leg Body parts n/a: Buttocks   Bathing assist Assist Level: Moderate Assistance - Patient 50 - 74%     Upper Body Dressing/Undressing Upper body dressing Upper body dressing/undressing activity did not occur (including orthotics): N/A What is the patient wearing?: Pull over shirt    Upper body assist Assist Level: Minimal Assistance - Patient > 75%    Lower Body Dressing/Undressing Lower body dressing    Lower body dressing activity did not occur: N/A What is the patient wearing?: Pants     Lower body assist Assist for lower body dressing: Moderate Assistance - Patient 50 - 74%      Toileting Toileting Toileting Activity did not occur Landscape architect and hygiene only): N/A (no void or bm)  Toileting assist Assist for toileting: Minimal Assistance - Patient > 75% (urinal placement)     Transfers Chair/bed transfer  Transfers assist     Chair/bed transfer assist level: Minimal Assistance - Patient > 75%     Locomotion Ambulation   Ambulation assist      Assist level: Minimal Assistance - Patient > 75% Assistive device: Walker-rolling Max distance: 150   Walk 10 feet activity   Assist     Assist level: Minimal Assistance - Patient > 75% Assistive device: Walker-rolling   Walk 50 feet activity   Assist    Assist level: Minimal Assistance - Patient > 75% Assistive device: Walker-rolling    Walk 150 feet activity   Assist Walk 150 feet activity did not occur: Safety/medical concerns  Assist level: Minimal Assistance - Patient > 75% Assistive device: Walker-rolling    Walk 10 feet on uneven surface  activity   Assist     Assist level: Minimal Assistance - Patient > 75% Assistive device: Hand held assist   Wheelchair     Assist Will patient use wheelchair at discharge?: No             Wheelchair 50 feet with 2 turns activity    Assist            Wheelchair 150 feet activity     Assist           BP (!) 158/76 (BP Location: Right Arm)   Pulse 75   Temp 97.6 F (36.4 C)   Resp 20   SpO2 96%   Medical Problem List and Plan: 1.  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  -pt s/p code stroke. Head CT, CTA do not demonstrate new event or occlusion. Sx d/t hypoperfusion given orthostasis.   -Continue CIR 2.  Antithrombotics: -DVT/anticoagulation: Lovenox             -antiplatelet therapy: Aspirin 325 mg daily and Plavix 75 mg daily x3  months then aspirin alone 3. Pain Management: Tylenol as needed.  4. Mood/dementia: Provide emotional support             -antipsychotic agents: N/A  -team providing ego support  5. Neuropsych/dementia: This patient is  not capable of making decisions on his own behalf. 6. Skin/Wound Care: Routine skin checks 7. Fluids/Electrolytes/Nutrition: Routine in and outs. 8.  Hypertension with orthostasis.     coreg  6.25mg  BID---held d/t #1    Vitals:   08/17/20 1050 08/17/20 1201  BP: (!) 160/72 (!) 158/76  Pulse: 71 75  Resp: 20 20  Temp:  97.6 F (36.4 C)  SpO2: 96%   elevated systolic but had 35DIXB drop sit to stand yesterday   8/24: orthostatic, on midodrine with improvements.  9.  COPD.  Oxygen dependent.  Check oxygen saturations every shift  8/22 Pulmonary nodule seen on CTA in left apex, 67mm      -primary team can discuss further with pt/family this week.   -?onc/pulmonary consult 10.  Legally blind bilaterally.  Patient has assistance of son at home- due to macular degeneration 11.  History of tobacco and alcohol use.  Provide counseling 12.  Hyperlipidemia.    Continue Lipitor 13.  Questionable medical compliance.  Provide counseling with patient and family. 14.?  Acute blood loss anemia  S/p EGD on 8/24, results pending. Nathan Hart discussed results with Dr. Erlinda Hong and continued on Aspirin for stroke prevention. 50% chance of rebleeding with Plavix.     LOS: 12 days A FACE TO FACE EVALUATION WAS PERFORMED  Nathan Hart 08/17/2020, 1:23 PM

## 2020-08-17 NOTE — Progress Notes (Signed)
   Request made by [facility] for liver lesion bx per Oncology recommendation. Imaging reviewed with Dr Pascal Lux  Recommend CT CAP with Cx first We will review imaging in am Possible bx 8/25  Will keep pt NPO for 8/25 for possible bx  [facility] aware and agreeable

## 2020-08-17 NOTE — Anesthesia Postprocedure Evaluation (Signed)
Anesthesia Post Note  Patient: Nathan Hart  Procedure(s) Performed: ESOPHAGOGASTRODUODENOSCOPY (EGD) (N/A )     Patient location during evaluation: PACU Anesthesia Type: MAC Level of consciousness: awake and alert Pain management: pain level controlled Vital Signs Assessment: post-procedure vital signs reviewed and stable Respiratory status: spontaneous breathing, nonlabored ventilation, respiratory function stable and patient connected to nasal cannula oxygen Cardiovascular status: stable and blood pressure returned to baseline Postop Assessment: no apparent nausea or vomiting Anesthetic complications: no   No complications documented.  Last Vitals:  Vitals:   08/17/20 1050 08/17/20 1201  BP: (!) 160/72 (!) 158/76  Pulse: 71 75  Resp: 20 20  Temp:  36.4 C  SpO2: 96%     Last Pain:  Vitals:   08/17/20 1050  TempSrc:   PainSc: 0-No pain                 Merlinda Frederick

## 2020-08-17 NOTE — Anesthesia Preprocedure Evaluation (Addendum)
Anesthesia Evaluation  Patient identified by MRN, date of birth, ID band  Reviewed: Allergy & Precautions, NPO status , Patient's Chart, lab work & pertinent test results  Airway Mallampati: II  TM Distance: >3 FB Neck ROM: Full    Dental  (+) Partial Upper, Edentulous Lower   Pulmonary shortness of breath, COPD,  oxygen dependent, Current Smoker,    Pulmonary exam normal breath sounds clear to auscultation       Cardiovascular hypertension, Normal cardiovascular exam Rhythm:Regular Rate:Normal  Normal sinus rhythm Normal ECG No significant change since last tracing Confirmed by Shelva Majestic [phone]) on 08/16/2020 10:02:08 PM   Neuro/Psych    GI/Hepatic negative GI ROS, Neg liver ROS,   Endo/Other  negative endocrine ROS  Renal/GU negative Renal ROS  negative genitourinary   Musculoskeletal negative musculoskeletal ROS (+)   Abdominal Normal abdominal exam  (+)   Peds  Hematology  (+) anemia , H/H 8.5/29   Anesthesia Other Findings   Reproductive/Obstetrics negative OB ROS                            Anesthesia Physical Anesthesia Plan  ASA: III  Anesthesia Plan: MAC   Post-op Pain Management:    Induction: Intravenous  PONV Risk Score and Plan: 0 and Propofol infusion and TIVA  Airway Management Planned: Natural Airway, Simple Face Mask and Nasal Cannula  Additional Equipment:   Intra-op Plan:   Post-operative Plan:   Informed Consent: I have reviewed the patients History and Physical, chart, labs and discussed the procedure including the risks, benefits and alternatives for the proposed anesthesia with the patient or authorized representative who has indicated his/her understanding and acceptance.       Plan Discussed with:   Anesthesia Plan Comments:         Anesthesia Quick Evaluation

## 2020-08-17 NOTE — Op Note (Signed)
[facility] Patient Name: Nathan Hart Procedure Date : 08/17/2020 MRN: [id number] Attending MD: Arta Silence , MD Date of Birth: December 25, 1956 CSN: [id number] Age: 64 Admit Type: Inpatient Procedure:                Upper GI endoscopy Indications:              Acute post hemorrhagic anemia, Melena Providers:                Arta Silence, MD, Burtis Junes, RN, Ladona Ridgel,                            Technician Referring MD:              Medicines:                Monitored Anesthesia Care Complications:            No immediate complications. Estimated Blood Loss:     Estimated blood loss: none. Procedure:                Pre-Anesthesia Assessment:                           - Prior to the procedure, a History and Physical                            was performed, and patient medications and                            allergies were reviewed. The patient's tolerance of                            previous anesthesia was also reviewed. The risks                            and benefits of the procedure and the sedation                            options and risks were discussed with the patient.                            All questions were answered, and informed consent                            was obtained. Prior Anticoagulants: The patient has                            taken Plavix (clopidogrel), last dose was 1 day                            prior to procedure. ASA Grade Assessment: III - A                            patient with severe systemic disease. After  reviewing the risks and benefits, the patient was                            deemed in satisfactory condition to undergo the                            procedure.                           After obtaining informed consent, the endoscope was                            passed under direct vision. Throughout the                            procedure, the patient's blood pressure, pulse,  and                            oxygen saturations were monitored continuously. The                            GIF-H190 ([id number]) Olympus gastroscope was                            introduced through the mouth, and advanced to the                            second part of duodenum. The upper GI endoscopy was                            accomplished without difficulty. The patient                            tolerated the procedure well. Scope In: Scope Out: Findings:      LA Grade A (one or more mucosal breaks less than 5 mm, not extending       between tops of 2 mucosal folds) esophagitis was found.      The exam of the esophagus was otherwise normal.      Patchy mild inflammation was found in the gastric antrum and in the       prepyloric region of the stomach.      The exam of the stomach was otherwise normal.      One non-bleeding superficial duodenal ulcer with no stigmata of bleeding       was found in the duodenal bulb. The lesion was 6 mm in largest dimension.      The exam of the duodenum was otherwise normal.      No old or fresh blood was seen to the extent of our examination. Impression:               - LA Grade A esophagitis.                           - Gastritis.                           -  Non-bleeding duodenal ulcer with no stigmata of                            bleeding.                           - No specimens collected. Moderate Sedation:      None Recommendation:           - Return patient to hospital ward for ongoing care.                           - Soft diet today.                           - Continue present medications.                           - Risk of rebleeding 50% on Plavix if started in                            next couple weeks; primary team and neurology team                            need to weigh risks:benefits of anticoagulation.                           - [facility] will follow. Procedure Code(s):        --- Professional ---                            [phone], Esophagogastroduodenoscopy, flexible,                            transoral; diagnostic, including collection of                            specimen(s) by brushing or washing, when performed                            (separate procedure) Diagnosis Code(s):        --- Professional ---                           K20.90, Esophagitis, unspecified without bleeding                           K29.70, Gastritis, unspecified, without bleeding                           K26.9, Duodenal ulcer, unspecified as acute or                            chronic, without hemorrhage or perforation                           D62, Acute posthemorrhagic anemia  K92.1, Melena (includes Hematochezia) CPT copyright 2019 American Medical Association. All rights reserved. The codes documented in this report are preliminary and upon coder review may  be revised to meet current compliance requirements. Arta Silence, MD 08/17/2020 10:47:17 AM This report has been signed electronically. Number of Addenda: 0

## 2020-08-17 NOTE — Progress Notes (Signed)
Speech Language Pathology Daily Session Note  Patient Details  Name: Nathan Hart MRN: [id number] Date of Birth: 04/01/1956  Today's Date: 08/17/2020 SLP Individual Time: 1303-1330 SLP Individual Time Calculation (min): 27 min  Short Term Goals: Week 2: SLP Short Term Goal 1 (Week 2): STG's=LTG's due to ELOS  Skilled Therapeutic Interventions:Skilled ST services focused on language skills. Pt's ex-wife present for ST session. SLP facilitated word finding strategies given various language tasks. Pt was initially unable to name common word given definition (0/3), however after naming opposites with mod A sentence completion cues (10/10) pt demonstrated increased ability to name item with sentence completion cues. Pt was able to name 7 out 10 items with max A sentence completions on second trial. Pt was able to name 3 animals in 1 minute during simple divergent naming task and 0/3 categories in simple convergent naming task. SLP provided education to pt and pt's wife pertaining to word finding with sentence completion cues and listing associated words. All questions answered to satisfaction. Pt was left in room with family, call bell within reach and bed alarm set. SLP recommends to continue skilled services     Pain Pain Assessment Pain Score: 0-No pain  Therapy/Group: Individual Therapy  Darell Saputo  [facility] 08/17/2020, 2:56 PM

## 2020-08-17 NOTE — Progress Notes (Signed)
Occupational Therapy Session Note  Patient Details  Name: Nathan Hart MRN: [id number] Date of Birth: 08-10-1956  Today's Date: 08/17/2020 OT Individual Time:  -       Short Term Goals: Week 1:  OT Short Term Goal 1 (Week 1): STGs=LTGs due to ELOS Week 2:  OT Short Term Goal 1 (Week 2): STGs = LTGs Week 3:     Skilled Therapeutic Interventions/Progress Updates:    Pt with pt's ex wife in the room and they are upset about not understanding what is going on with the ongoing new tests. Discussed with ex wife progress and what is happening from a therapy perspective and then PA Linna Hoff Angiulli also discussed things from medical perspective. Pt not able to share what has been previously shared due to memory deficits. Missed 60 min . Now getting contrast from CT.   Therapy Documentation Precautions:  Precautions Precautions: Fall Precaution Comments: legally blind Restrictions Weight Bearing Restrictions: No General: General OT Amount of Missed Time: 60 Minutes PT Missed Treatment Reason: Patient unwilling to participate;Patient fatigue   Therapy/Group: Individual Therapy  Willeen Cass [facility] 08/17/2020, 2:36 PM

## 2020-08-17 NOTE — Progress Notes (Signed)
Physical Therapy Note  Patient Details  Name: Nathan Hart MRN: [id number] Date of Birth: 1956-04-04 Today's Date: 08/17/2020    Pt missed 45 min of skilled PT session. Pt just returned from EGD procedure and declines any activity at this time including bed level. Pt with all needs in reach in the bed. Will follow up if able.   Canary Brim Ivory Broad, PT, DPT, CBIS  08/17/2020, 11:17 AM

## 2020-08-17 NOTE — Interval H&P Note (Signed)
History and Physical Interval Note:  08/17/2020 9:56 AM  Nathan Hart  has presented today for surgery, with the diagnosis of melena, anemia.  The various methods of treatment have been discussed with the patient and family. After consideration of risks, benefits and other options for treatment, the patient has consented to  Procedure(s): ESOPHAGOGASTRODUODENOSCOPY (EGD) (N/A) as a surgical intervention.  The patient's history has been reviewed, patient examined, no change in status, stable for surgery.  I have reviewed the patient's chart and labs.  Questions were answered to the patient's satisfaction.     [place] for endoscopy today for anemia, dark stools; recent CVA; recent start plavix (now on hold).  Risks/benefits explained to patient (who can not provide his own consent) as well as patient's son Nathan Hart (who signed the consent form).

## 2020-08-17 NOTE — Progress Notes (Signed)
Physical Therapy Session Note  Patient Details  Name: Nathan Hart MRN: [id number] Date of Birth: 1956/06/14  Today's Date: 08/17/2020 PT Individual Time: 0830-0917 PT Individual Time Calculation (min): 47 min  (Missed 13 min - transport present during session to take patient off the floor for procedure)  Short Term Goals: Week 1:  PT Short Term Goal 1 (Week 1): STGs=LTGs  Skilled Therapeutic Interventions/Progress Updates:    Pt received supine in bed, agreeable to PT session. Pt denies pain and request to toilet. Shoes and TED hose donned with totalA while pt supine for time management. Pt performed supine<>sit with supervision, maintains sitting EOB with supervision. Performed sit<>stand with CGA and RW, cues for RUE placement and would occasionally require hand-over-hand assist for guidance. Pt maintained standing with CGA while therapist held urinal and pt was continent of voiding. Performed stand<>pivot transfer from EOB to w/c with minA, mod cues for guiding and locating w/c successfully. Pt transported to day room gym with [place] in w/c for time management where he ambulated 2x47ft + 159ft + 186ft (seated rest breaks b/w trials) with minA and RW. Cues for pacing activity, maintaining RUE grip to RW, reducing R lateral lean, midline orienting, deep breathing, and required visual guidance for returning and locating w/c. Pt performed the following seated there-ex:  -1x10 unilateral LUE 4# dumbbell shldr press -1x10 unilateral RUE 4# dumbbell shldr press (Active-assisted) -1x10 unilateral RUE 4# dumbbell pronation/supination with elbow supported -1x10 unilateral RUE 4# dumbbell bicep curls with elbow supported  Seated rest breaks provided throughout session for recovery. Patient handoff to RN's who were present for transport for scheduled off the floor procedure (EGD).   Therapy Documentation Precautions:  Precautions Precautions: Fall Precaution Comments: legally  blind Restrictions Weight Bearing Restrictions: No General: PT Amount of Missed Time (min): 13 Minutes PT Missed Treatment Reason: Other (Comment) (Transport present for off the floor procedure (EGD))  Therapy/Group: Individual Therapy  Delshawn Stech P Maya Arcand PT 08/17/2020, 9:26 AM

## 2020-08-17 NOTE — Progress Notes (Signed)
STROKE TEAM PROGRESS NOTE   INTERVAL HISTORY Daughter in law is at bedside. Pt upset about not being discharged. EGD done this morning showed non-bleeding duodenal ulcer with no stigmata of bleeding. Pt walked with PT this am. BP 150-160s. GI estimates 50% re-bleeding if plavix is restarted in the next couple of weeks.   OBJECTIVE Vitals:   08/17/20 1018 08/17/20 1030 08/17/20 1040 08/17/20 1050  BP: 118/70 (!) 153/70 (!) 161/75 (!) 160/72  Pulse: 85 71 73 71  Resp: (!) 30 17 (!) 22 20  Temp: 98.1 F (36.7 C)     TempSrc: Axillary     SpO2: 100% 97% 98% 96%   CBC:  Recent Labs  Lab 08/16/20 1501 08/16/20 1708  WBC 7.7 6.9  NEUTROABS 5.5 4.7  HGB 8.5* 8.5*  HCT 28.6* 28.6*  MCV 77.7* 77.5*  PLT 229 756   Basic Metabolic Panel:  Recent Labs  Lab 08/15/20 0507 08/16/20 0529  NA 136 138  K 3.6 3.6  CL 106 106  CO2 23 23  GLUCOSE 111* 103*  BUN 8 11  CREATININE 0.60* 0.62  CALCIUM 8.3* 8.2*   Lipid Panel:     Component Value Date/Time   CHOL 102 08/03/2020 0624   TRIG 98 08/03/2020 0624   HDL 35 (L) 08/03/2020 0624   CHOLHDL 2.9 08/03/2020 0624   VLDL 20 08/03/2020 0624   LDLCALC 47 08/03/2020 0624   HgbA1c:  Lab Results  Component Value Date   HGBA1C 6.0 (H) 08/03/2020   Urine Drug Screen:     Component Value Date/Time   LABOPIA NONE DETECTED 08/02/2020 2015   COCAINSCRNUR NONE DETECTED 08/02/2020 2015   LABBENZ NONE DETECTED 08/02/2020 2015   AMPHETMU NONE DETECTED 08/02/2020 2015   THCU NONE DETECTED 08/02/2020 2015   LABBARB NONE DETECTED 08/02/2020 2015    Alcohol Level     Component Value Date/Time   ETH <10 08/02/2020 2015    IMAGING CT HEAD CODE STROKE WO CONTRAST 08/14/2020 IMPRESSION:  1. No acute finding when compared with prior.  2. Expected evolution of recent left occipital and border zone infarcts.  3. Remote right parietal infarct.   CT ANGIO HEAD W OR WO CONTRAST CT ANGIO NECK W OR WO CONTRAST 08/14/2020 IMPRESSION:  1. No  emergent vascular finding. No change from CTA and catheter angiogram earlier this month.  2. 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  3. Diffusely patent right cervical carotid stent.  4. Pulmonary nodules in the apical lungs including a 15 mm left apical subpleural nodule which appears to invade the pleura, primarily worrisome for malignancy/metastatic disease. Recommend dedicated chest CT.   Transthoracic Echocardiogram  08/04/2020 IMPRESSIONS  1. Left ventricular ejection fraction, by estimation, is 55 to 60%. The left ventricle has normal function. The left ventricle demonstrates regional wall motion abnormalities with possible basal inferior hypokinesis. There is mild left ventricular hypertrophy. Left ventricular diastolic parameters are consistent with Grade I diastolic dysfunction (impaired relaxation).  2. Right ventricular systolic function is normal. The right ventricular size is normal. Tricuspid regurgitation signal is inadequate for assessing PA pressure.  3. The mitral valve is normal in structure. No evidence of mitral valve regurgitation. No evidence of mitral stenosis.  4. The aortic valve is tricuspid. Aortic valve regurgitation is not visualized. No aortic stenosis is present.  5. The inferior vena cava is normal in size with greater than 50% respiratory variability, suggesting right atrial pressure of 3 mmHg.  6. Technically  difficult study with poor acoustic windows.   CT CHEST WO CONTRAST 08/16/2020 1. Innumerable (> than 20) solid pulmonary nodules scattered throughout both lungs, all new since 09/07/2019 chest CT, largest a pleural based 1.8 cm nodule in the medial apical left upper lobe with possible associated mediastinal invasion. Findings are most compatible with pulmonary metastases, although the dominant medial apical left upper lobe nodule could represent a primary bronchogenic carcinoma. 2. Indistinct hypodense masslike 8.4  cm left liver dome focus, new, suspicious for liver metastasis. MRI abdomen without and with IV contrast recommended for further characterization. 3. Complete right middle lobe atelectasis. 4. Two-vessel coronary atherosclerosis. 5. Stable right adrenal adenoma. 6. Aortic Atherosclerosis (ICD10-I70.0) and Emphysema (ICD10-J43.9).    ECG - 08/14/20 - SR rate 79 BPM. (See cardiology reading for complete details)   PHYSICAL EXAM  Temp:  [97.8 F (36.6 C)-98.9 F (37.2 C)] 98.1 F (36.7 C) (08/24 1018) Pulse Rate:  [71-100] 71 (08/24 1050) Resp:  [16-30] 20 (08/24 1050) BP: (118-182)/(70-98) 160/72 (08/24 1050) SpO2:  [96 %-100 %] 96 % (08/24 1050)  General - Well nourished, well developed, in no apparent distress.  Ophthalmologic - fundi not visualized due to noncooperation.  Cardiovascular - Regular rhythm and rate.  Mental Status -  Level of arousal and orientation to month and person were intact. However, not orientated to place, year, or age.  Language including expression, naming, repetition, comprehension was assessed and found intact. Mild dysarthria  Cranial Nerves II - XII - II - legally blind bilaterally, right hemianopia, left visual field about to have LP and HW only. III, IV, VI - Extraocular movements intact. V - Facial sensation intact bilaterally. VII - Facial movement intact bilaterally. VIII - Hearing & vestibular intact bilaterally. X - Palate elevates symmetrically. XI - Chin turning & shoulder shrug intact bilaterally. XII - Tongue protrusion intact.  Motor Strength - The patient's strength was normal in all extremities with slight pronator drift on the right.  Bulk was normal and fasciculations were absent.   Motor Tone - Muscle tone was assessed at the neck and appendages and was normal.  Reflexes - The patient's reflexes were symmetrical in all extremities and he had no pathological reflexes.  Sensory - Light touch, temperature/pinprick were assessed and  were symmetrical.    Coordination - The patient had normal movements in the hands with no ataxia or dysmetria.  Tremor was absent.  Gait and Station - deferred.   ASSESSMENT/PLAN [name] is a 64 y.o. 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 He did not receive IV t-PA due to recent stroke and improving deficits.   TIA in the setting of orthostatic hypotension and severe anemia with left CCA occlusion  Left MCA syndrome with low BP and anemia 8/21 -> resolved  CT no acute finding with chronic infarcts  CTA head and neck similar as before with left CCA occlusion  Received IVF bolus and infusion  Received albumin x 2 doses  Orthostatic vital sitting 135/76 and standing 106/71 - put on midodrine -> improved   Hb 8.0->7.0 - received 2u PRBC -> 8.9->8.5->8.5->8.4  Continue BP and CBC monitoring  Recent stroke 08/02/2020 - left posterior MCA and left PCA infarcts, secondary to left CCA occlusion, large vessel disease source  CT showed left parieto-occipital acute  infarct, and old right CR infarct.   CTA head and neck showed left CCA possible occlusion, and left ICA reconstitution with slow flow. Right ICA stent patent, bilateral siphon high-grade stenosis. Left P2 high-grade stenosis.   CTP 33 cc core with 45 penumbra.  MRI  Acute infarcts involving left posterior greater than anterior circulations, noting presence of a fetal left PCA on the prior CTA  IR left CCA mid to distal occlusion, left MCA and ACA patent, right ICA stent patent.  2D Echo EF 55 to 60%  LDL 47  HgbA1c 6.0  Lovenox for VTE prophylaxis  No antithrombotic prior to admission, now on aspirin 325 mg daily. Per GI, Risk of rebleed 50% if plavix resumed in next couple weeks. Ok to  continue ASA alone at discharge from neuro standpoint given risk of GIB, anemia and orthostasis   Carotid stenosis  08/2019 cerebral angiogram showed right ICA 90% stenosis, left CCA 50% stenosis, left ICA 50% stenosis.  Left fetal PCA.  Left VA occluded with distal reconstitution  09/2019 status post right carotid stenting with Dr. Estanislado Pandy  This admission CTA head neck left CCA occlusion, right ICA stent patent  This admission cerebral angiogram left CCA mid to distal occlusion, right ICA stent patent  No endovascular procedure indicated, continue medical management.  Long-term BP goal 130-150  Orthostatic hypotension Hx of hypertension  Orthostatic vitals showed sitting 135/76, standing 106/71   On TED hose  On gentle hydration - NS @ 50  On midodrine now 5mg  tid  BP improved 150s  Severe anemia GIB in setting of aspirin and plavix use  Hb 8.0->7.0  Stool occult blood POSITIVE  Received PRBC 2U transfusion  CBC showed microcytic anemia  On iron pills   Monitoring CBC  GI consulted  Added PPI, hold off plavix  EGD 8/24 - esophagitis, gastritis, non-bleeding duodenal ulcer.   Hyperlipidemia  Home meds: Lipitor 40  LDL 47, goal < 70  Now on Lipitor 80  Continue statin at discharge  Lung and liver metastasis    CTA neck showed Pulmonary nodules in the apical lungs including a 15 mm left apical subpleural nodule which appears to invade the pleura, primarily worrisome for malignancy/metastatic disease.   CT chest -innumerable pulmonary nodules largest at pleural-based 1.8 cm, compatible with pulmonary metastasis.  Indistinct hypodense masslike 8.4 cm left liver dome focus, new, suspicious for liver metastasis.  EGD 8/24 - esophagitis,  Gastritis, non-bleeding duodenal ulcer  Consider colonoscopy and oncology. 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 Please call with questions. Pt will follow up with stroke clinic Dr. Leonie Man at [facility] in about 4 weeks. Thanks for the consult.   Rosalin Hawking, MD PhD Stroke Neurology 08/17/2020 11:06 AM   To contact Stroke Continuity provider, please refer to [email or web address] After hours, contact General Neurology

## 2020-08-17 NOTE — Progress Notes (Signed)
Discussed EGD reports with Dr. Erlinda Hong as well as risks v/s benefits based on overall  prognosis with question of metastatic disease. He feels that ASA is the conservative choice as resuming Plavix puts patient at 50% chance of rebleeding. Will inform primary team following patient.

## 2020-08-17 NOTE — Anesthesia Procedure Notes (Signed)
Procedure Name: MAC Date/Time: 08/17/2020 10:09 AM Performed by: Lavell Luster, CRNA Pre-anesthesia Checklist: Patient identified, Emergency Drugs available, Suction available, Patient being monitored and Timeout performed Patient Re-evaluated:Patient Re-evaluated prior to induction Oxygen Delivery Method: Nasal cannula Preoxygenation: Pre-oxygenation with 100% oxygen Induction Type: IV induction Placement Confirmation: positive ETCO2 and breath sounds checked- equal and bilateral Dental Injury: Teeth and Oropharynx as per pre-operative assessment

## 2020-08-17 NOTE — Transfer of Care (Signed)
Immediate Anesthesia Transfer of Care Note  Patient: Nathan Hart  Procedure(s) Performed: ESOPHAGOGASTRODUODENOSCOPY (EGD) (N/A )  Patient Location: Endoscopy Unit  Anesthesia Type:MAC  Level of Consciousness: sedated  Airway & Oxygen Therapy: Patient connected to nasal cannula oxygen  Post-op Assessment: Post -op Vital signs reviewed and stable  Post vital signs: stable  Last Vitals:  Vitals Value Taken Time  BP    Temp    Pulse    Resp    SpO2      Last Pain:  Vitals:   08/17/20 0933  TempSrc: Oral  PainSc: 0-No pain         Complications: No complications documented.

## 2020-08-18 ENCOUNTER — Encounter (HOSPITAL_COMMUNITY): Payer: Self-pay | Admitting: Gastroenterology

## 2020-08-18 ENCOUNTER — Inpatient Hospital Stay (HOSPITAL_COMMUNITY): Payer: Medicare Other

## 2020-08-18 DIAGNOSIS — K769 Liver disease, unspecified: Secondary | ICD-10-CM

## 2020-08-18 DIAGNOSIS — Z7189 Other specified counseling: Secondary | ICD-10-CM

## 2020-08-18 DIAGNOSIS — Z515 Encounter for palliative care: Secondary | ICD-10-CM

## 2020-08-18 DIAGNOSIS — F0150 Vascular dementia without behavioral disturbance: Secondary | ICD-10-CM

## 2020-08-18 LAB — CBC WITH DIFFERENTIAL/PLATELET
Abs Immature Granulocytes: 0.03 10*3/uL (ref 0.00–0.07)
Abs Immature Granulocytes: 0 10*3/uL (ref 0.00–0.07)
Basophils Absolute: 0 10*3/uL (ref 0.0–0.1)
Basophils Absolute: 0.1 10*3/uL (ref 0.0–0.1)
Basophils Relative: 1 %
Basophils Relative: 1 %
Eosinophils Absolute: 0.2 10*3/uL (ref 0.0–0.5)
Eosinophils Absolute: 0 10*3/uL (ref 0.0–0.5)
Eosinophils Relative: 4 %
Eosinophils Relative: 0 %
HCT: 26.7 % — ABNORMAL LOW (ref 39.0–52.0)
HCT: 26.2 % — ABNORMAL LOW (ref 39.0–52.0)
Hemoglobin: 7.6 g/dL — ABNORMAL LOW (ref 13.0–17.0)
Hemoglobin: 7.8 g/dL — ABNORMAL LOW (ref 13.0–17.0)
Immature Granulocytes: 1 %
Lymphocytes Relative: 9 %
Lymphocytes Relative: 15 %
Lymphs Abs: 0.6 10*3/uL — ABNORMAL LOW (ref 0.7–4.0)
Lymphs Abs: 1.1 10*3/uL (ref 0.7–4.0)
MCH: 22.6 pg — ABNORMAL LOW (ref 26.0–34.0)
MCH: 23.8 pg — ABNORMAL LOW (ref 26.0–34.0)
MCHC: 28.5 g/dL — ABNORMAL LOW (ref 30.0–36.0)
MCHC: 29.8 g/dL — ABNORMAL LOW (ref 30.0–36.0)
MCV: 79.5 fL — ABNORMAL LOW (ref 80.0–100.0)
MCV: 79.9 fL — ABNORMAL LOW (ref 80.0–100.0)
Monocytes Absolute: 0.5 10*3/uL (ref 0.1–1.0)
Monocytes Absolute: 0 10*3/uL — ABNORMAL LOW (ref 0.1–1.0)
Monocytes Relative: 8 %
Monocytes Relative: 0 %
Neutro Abs: 4.9 10*3/uL (ref 1.7–7.7)
Neutro Abs: 6 10*3/uL (ref 1.7–7.7)
Neutrophils Relative %: 77 %
Neutrophils Relative %: 84 %
Platelets: 216 10*3/uL (ref 150–400)
Platelets: 223 10*3/uL (ref 150–400)
RBC: 3.36 MIL/uL — ABNORMAL LOW (ref 4.22–5.81)
RBC: 3.28 MIL/uL — ABNORMAL LOW (ref 4.22–5.81)
RDW: 22.4 % — ABNORMAL HIGH (ref 11.5–15.5)
RDW: 22.9 % — ABNORMAL HIGH (ref 11.5–15.5)
WBC: 6.3 10*3/uL (ref 4.0–10.5)
WBC: 7.1 10*3/uL (ref 4.0–10.5)
nRBC: 0 % (ref 0.0–0.2)
nRBC: 0 % (ref 0.0–0.2)
nRBC: 0 /100 WBC

## 2020-08-18 LAB — BASIC METABOLIC PANEL WITH GFR
Anion gap: 8 (ref 5–15)
BUN: 8 mg/dL (ref 8–23)
CO2: 23 mmol/L (ref 22–32)
Calcium: 8.1 mg/dL — ABNORMAL LOW (ref 8.9–10.3)
Chloride: 106 mmol/L (ref 98–111)
Creatinine, Ser: 0.57 mg/dL — ABNORMAL LOW (ref 0.61–1.24)
GFR calc Af Amer: >60 mL/min
GFR calc non Af Amer: >60 mL/min
Glucose, Bld: 117 mg/dL — ABNORMAL HIGH (ref 70–99)
Potassium: 3.6 mmol/L (ref 3.5–5.1)
Sodium: 137 mmol/L (ref 135–145)

## 2020-08-18 MED ORDER — SENNA 8.6 MG PO TABS
2.0000 | ORAL_TABLET | Freq: Every day | ORAL | Status: DC
  Administered 2020-08-19: 17.2 mg via ORAL
  Filled 2020-08-18: qty 2

## 2020-08-18 NOTE — Progress Notes (Signed)
Occupational Therapy Note  Patient Details  Name: Nathan Hart MRN: [id number] Date of Birth: 1956/01/27  Pt's plan of care adjusted to q.d after speaking with care team and discussed with MD in team conference as pt currently unable to tolerate current therapy schedule with OT, PT, and SLP.  Pt with multiple medical issues at this time. Will offer therapies at 30 min a day for opportunity to maintain at goal level and fam education. Pt is at goal level but when has a medical crisis present with more neurological deficits and then will return to PLOF.   Willeen Cass [facility] 08/18/2020, 11:26 AM

## 2020-08-18 NOTE — Progress Notes (Addendum)
[facility] PHYSICAL MEDICINE & REHABILITATION PROGRESS NOTE  Subjective/Complaints: Patient seen laying in bed this morning.  He indicates he slept well overnight.  He asks if he can go home today, when informed he likely will not be discharged home, patient upset.  Afterward, notified of increasing confusion right hemiparesis.  Patient was planned to have liver biopsy today, but due to neurological changes CT head ordered after discussion with neurology.  Liver biopsy currently on hold after discussion with family.  Discussed with covering physician.  ROS: Limited due to cognition.   Objective: Vital Signs: Blood pressure 136/79, pulse 97, temperature 97.8 F (36.6 C), temperature source Oral, resp. rate 18, SpO2 96 %. CT CHEST WO CONTRAST  Result Date: 08/16/2020 CLINICAL DATA:  COPD, oxygen dependent. Follow-up apical pulmonary nodule seen on recent neck CT angiogram. EXAM: CT CHEST WITHOUT CONTRAST TECHNIQUE: Multidetector CT imaging of the chest was performed following the standard protocol without IV contrast. COMPARISON:  08/14/2020 head and neck CT angiogram. 09/07/2019 chest CT angiogram. FINDINGS: Cardiovascular: Normal heart size. No significant pericardial effusion/thickening. Left anterior descending and left circumflex coronary atherosclerosis. Atherosclerotic nonaneurysmal thoracic aorta. Normal caliber pulmonary arteries. Mediastinum/Nodes: No discrete thyroid nodules. Unremarkable esophagus. No pathologically enlarged axillary, mediastinal or hilar lymph nodes, noting limited sensitivity for the detection of hilar adenopathy on this noncontrast study. Lungs/Pleura: No pneumothorax. No pleural effusion. Severe centrilobular emphysema with diffuse bronchial wall thickening. Innumerable (> than 20) solid pulmonary nodules scattered throughout both lungs, all new since 09/07/2019 chest CT, largest a pleural based 1.8 x 1.2 cm nodule in the medial apical left upper lobe with possible  mediastinal invasion (series 5/image 21). Additional representative pulmonary nodules measure 1.0 cm in the lingula (series 5/image 86), 1.2 cm in the left lower lobe (series 5/image 106) and 0.9 cm posteriorly in right upper lobe (series 5/image 73). Complete right middle lobe atelectasis. Upper abdomen: Indistinct hypodense masslike 8.4 cm left liver dome focus (series 3/image 141), new. Stable 3.9 cm right adrenal adenoma with density -10 HU. Partially visualized 3.3 cm anterior upper left renal simple cyst. Layering hyperdensity in the gallbladder could represent small gallstones or vicarious excretion of IV contrast. Musculoskeletal: No aggressive appearing focal osseous lesions. Moderate thoracic spondylosis. IMPRESSION: 1. Innumerable (> than 20) solid pulmonary nodules scattered throughout both lungs, all new since 09/07/2019 chest CT, largest a pleural based 1.8 cm nodule in the medial apical left upper lobe with possible associated mediastinal invasion. Findings are most compatible with pulmonary metastases, although the dominant medial apical left upper lobe nodule could represent a primary bronchogenic carcinoma. 2. Indistinct hypodense masslike 8.4 cm left liver dome focus, new, suspicious for liver metastasis. MRI abdomen without and with IV contrast recommended for further characterization. 3. Complete right middle lobe atelectasis. 4. Two-vessel coronary atherosclerosis. 5. Stable right adrenal adenoma. 6. Aortic Atherosclerosis (ICD10-I70.0) and Emphysema (ICD10-J43.9). Electronically Signed   By: Ilona Sorrel M.D.   On: 08/16/2020 12:52   CT CHEST ABDOMEN PELVIS W CONTRAST  Result Date: 08/17/2020 CLINICAL DATA:  Evaluate metastatic disease EXAM: CT CHEST, ABDOMEN, AND PELVIS WITH CONTRAST TECHNIQUE: Multidetector CT imaging of the chest, abdomen and pelvis was performed following the standard protocol during bolus administration of intravenous contrast. CONTRAST:  164mL OMNIPAQUE IOHEXOL 300  MG/ML  SOLN COMPARISON:  CT of the chest from the previous day FINDINGS: CT CHEST FINDINGS Cardiovascular: Thoracic aorta and its branches demonstrate atherosclerotic calcification. No aneurysmal dilatation is seen. 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 Pulmonary artery shows a normal branching pattern. No definitive filling defect to suggest pulmonary embolism is noted. Mediastinum/Nodes: Thoracic inlet shows no acute abnormality. Sizable mediastinal adenopathy is noted. No left hilar adenopathy is seen. There is fullness in the right hilum with what appear to be small lymph nodes as well as considerable soft tissue surrounding the middle lobe bronchus centrally. Possibility of underlying mass deserves consideration. The esophagus is within normal limits. Lungs/Pleura: Lungs are again well aerated with diffuse emphysematous changes. There are multiple parenchymal nodules again identified scattered throughout both lungs similar to that seen on the previous day. Largest of these lies medially in the left upper lobe with central decreased attenuation consistent with necrosis. The overall appearance is similar to that noted on the prior study and again suspicious for underlying neoplasm. The fullness in the right hilum surrounding the right middle lobe bronchus is also suspicious for underlying neoplasm. No sizable effusion is seen. No other focal abnormality is noted. Musculoskeletal: Degenerative changes of the thoracic spine are noted. No rib abnormality is noted. Old healed rib fractures on the right are seen. No compression deformity is noted. CT ABDOMEN PELVIS FINDINGS Hepatobiliary: The liver demonstrates a large geographic area of decreased attenuation with peripheral enhancement predominately within the left lobe of the liver. Scattered smaller lesions are noted scattered throughout the liver. These changes are again consistent  with metastatic disease. Tissue sampling may be helpful for further evaluation. The gallbladder is within normal limits. Pancreas: Unremarkable. No pancreatic ductal dilatation or surrounding inflammatory changes. Spleen: Spleen is mildly prominent although no focal mass is seen. Adrenals/Urinary Tract: The left adrenal gland is within normal limits. Right adrenal gland demonstrates a focal 3.7 by 2.4 cm soft tissue mass lesion similar to that seen prior exam this is stable from prior studies and consistent with a adrenal adenoma. Kidneys demonstrate a normal enhancement pattern. Cystic changes are noted bilaterally. No renal calculi or obstructive changes are seen. Delayed images demonstrate normal excretion of contrast. The bladder is well distended. Stomach/Bowel: Scattered diverticular change of the colon is noted without evidence of diverticulitis. No obstructive or inflammatory changes are seen. No mass lesion is noted. The appendix is within normal limits. The small bowel and stomach are unremarkable. Vascular/Lymphatic: Aortic atherosclerosis. No enlarged abdominal or pelvic lymph nodes. Reproductive: Prostate is unremarkable. Other: No abdominal wall hernia or abnormality. No abdominopelvic ascites. Musculoskeletal: Degenerative changes of lumbar spine are noted most prominent at L4-5. IMPRESSION: CT of the chest: Stable parenchymal nodules bilaterally similar to that seen on the CT from the previous day. Better visualized on today's exam is enhancing soft tissue in the right hilum enveloping the right middle lobe bronchus with narrowing suspicious for a combination of adenopathy and possible underlying mass. Bronchoscopic evaluation may be helpful. 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 CT of the abdomen and pelvis: Metastatic disease involving primarily the left lobe of the liver although scattered smaller lesions are seen. This is similar to that seen on  the prior exam. This again measures approximately 8 cm in greatest dimension. Tissue sampling may be helpful for further evaluation. Stable right adrenal adenoma. 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 Electronically Signed   By: Inez Catalina M.D.   On: 08/17/2020 20:26   Recent Labs    08/17/20 1625 08/18/20 0609  WBC 6.7 6.3  HGB 8.0* 7.6*  HCT  28.1* 26.7*  PLT 238 216   Recent Labs    08/17/20 1112 08/18/20 0609  NA 139 137  K 3.8 3.6  CL 108 106  CO2 21* 23  GLUCOSE 113* 117*  BUN 10 8  CREATININE 0.66 0.57*  CALCIUM 8.2* 8.1*    Physical Exam: BP 136/79 (BP Location: Left Arm)   Pulse 97   Temp 97.8 F (36.6 C) (Oral)   Resp 18   SpO2 96%  Constitutional: No distress . Vital signs reviewed. HENT: Normocephalic.  Atraumatic. Eyes: EOMI, however blind bilaterally. No discharge. Cardiovascular: No JVD.  RRR. Respiratory: Normal effort.  No stridor.Bilaterally clear to auscultation. GI: Non-distended.  BS +. Skin: Warm and dry.  Intact. Psych: Flat.  Irritated. Musc: No edema in extremities.  No tenderness in extremities. Neuro: Alert Motor: Generalized weakness noted >> RUE/RLE  Assessment/Plan: 1. Functional deficits secondary to left posterior MCA/PCA infarcts which require 3+ hours per day of interdisciplinary therapy in a comprehensive inpatient rehab setting.  Physiatrist is providing close team supervision and 24 hour management of active medical problems listed below.  Physiatrist and rehab team continue to assess barriers to discharge/monitor patient progress toward functional and medical goals  Care Tool:  Bathing    Body parts bathed by patient: Right arm, Chest, Abdomen, Front perineal area, Face   Body parts bathed by helper: Left arm, Buttocks, Right upper leg, Left upper leg, Right lower leg, Left lower leg Body parts n/a: Buttocks   Bathing assist Assist Level: Moderate Assistance - Patient 50 - 74%     Upper Body  Dressing/Undressing Upper body dressing Upper body dressing/undressing activity did not occur (including orthotics): N/A What is the patient wearing?: Pull over shirt    Upper body assist Assist Level: Minimal Assistance - Patient > 75%    Lower Body Dressing/Undressing Lower body dressing    Lower body dressing activity did not occur: N/A What is the patient wearing?: Pants     Lower body assist Assist for lower body dressing: Moderate Assistance - Patient 50 - 74%     Toileting Toileting Toileting Activity did not occur Landscape architect and hygiene only): N/A (no void or bm)  Toileting assist Assist for toileting: Minimal Assistance - Patient > 75% (urinal placement)     Transfers Chair/bed transfer  Transfers assist     Chair/bed transfer assist level: Minimal Assistance - Patient > 75%     Locomotion Ambulation   Ambulation assist      Assist level: Minimal Assistance - Patient > 75% Assistive device: Walker-rolling Max distance: 150   Walk 10 feet activity   Assist     Assist level: Minimal Assistance - Patient > 75% Assistive device: Walker-rolling   Walk 50 feet activity   Assist    Assist level: Minimal Assistance - Patient > 75% Assistive device: Walker-rolling    Walk 150 feet activity   Assist Walk 150 feet activity did not occur: Safety/medical concerns  Assist level: Minimal Assistance - Patient > 75% Assistive device: Walker-rolling    Walk 10 feet on uneven surface  activity   Assist     Assist level: Minimal Assistance - Patient > 75% Assistive device: Hand held assist   Wheelchair     Assist Will patient use wheelchair at discharge?: No             Wheelchair 50 feet with 2 turns activity    Assist  Wheelchair 150 feet activity     Assist           BP 136/79 (BP Location: Left Arm)   Pulse 97   Temp 97.8 F (36.6 C) (Oral)   Resp 18   SpO2 96%   Medical Problem  List and Plan: 1.  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  Continue CIR as tolerated   CT head ordered due to acute neurological changes after discussion with neurology  Palliative care consult to discuss goals of care  Team conference today to discuss current and goals and coordination of care, home and environmental barriers, and discharge planning with nursing, case manager, and therapies. Please see conference note from today as well.-After discussion with therapies, patient making limited further progress/plateauing in therapies and refusing therapies at times.  Given his acute medical changes, functional level fluctuates.  As bed availability is limited in acute hospital, will maintain patient on rehab and provide maintenance therapies until completion of medical work-up and discussion/decision was family regarding goals of care. 2.  Antithrombotics: -DVT/anticoagulation: Lovenox             -antiplatelet therapy: Aspirin 325 mg daily and Plavix 75 mg daily x3 months then aspirin alone 3. Pain Management: Tylenol as needed.  4. Mood/dementia: Provide emotional support             -antipsychotic agents: N/A  -team providing ego support  5. Neuropsych/dementia: This patient is  not capable of making decisions on his own behalf. 6. Skin/Wound Care: Routine skin checks 7. Fluids/Electrolytes/Nutrition: Routine in and outs. 8.  Hypertension with orthostasis.     coreg  6.25mg  BID---held d/t #1    Vitals:   08/18/20 0934 08/18/20 0936  BP: (!) 159/80 136/79  Pulse: 93 97  Resp: 18 18  Temp: 97.8 F (36.6 C) 97.8 F (36.6 C)  SpO2: 96% 96%   On midodrine   IVF initiated  Slightly labile, orthostatics negative on 8/25 9.  COPD.  Oxygen dependent.  Check oxygen saturations every shift  8/22 Pulmonary nodule seen on CTA in left  apex, 21mm, per son - noted 10 years ago in lung and patient did not want any further workup/follow up  Discussed with heme-onc, CT ordered showing liver lesions-plan was to biopsy liver on 8/25, however due to acute neurological changes biopsy canceled.  After discussion with son, biopsy definitely on hold. 10.  Legally blind bilaterally.  Patient has assistance of son at home- due to macular degeneration 11.  History of tobacco and alcohol use.  Provide counseling 12.  Hyperlipidemia.    Continue Lipitor 13.  Questionable medical compliance.  Provide counseling with patient and family. 14.?  Acute blood loss anemia  Hemoglobin 7.6 on 8/25  S/p EGD on 8/24, communicated with GI-broad-based duodenal ulcer.  Recommended ASA alone due to increased bleeding risk with dual antiplatelets.  Discussed with neurology, plan to continue aspirin alone.    PPI twice daily x8 weeks  LOS: 13 days A FACE TO FACE EVALUATION WAS PERFORMED  Azell Bill Lorie Phenix 08/18/2020, 10:27 AM

## 2020-08-18 NOTE — Consult Note (Signed)
Consultation Note Date: 08/18/2020   Patient Name: Nathan Hart  DOB: August 13, 1956  MRN: [id number]  Age / Sex: 64 y.o., male  PCP: Patient, No Pcp Per Referring Physician: Jamse Arn, MD  Reason for Consultation: Establishing goals of care  HPI/Patient Profile: 64 y.o. male  with past medical history of  admitted on 08/05/2020 to CIR following admission to [facility] with new stroke.  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  Plan was for liver biopsy, but he developed new neurologic symptoms and biopsy cancelled.  Palliative consulted for goals of care.  Clinical Assessment and Goals of Care: I met today with Nathan Hart.  His daughter in law was at the bedside.  His 2 sons joined via phone for family meeting.  Dr. Erlinda Hong also arrived shortly after initiation of discussion and was present for majority of visit.   We discussed clinical course as well as wishes moving forward in regard to care plan in light of his new stroke and likely underlying malignancy.  Family had multiple questions regarding his stroke and likely paths forward/prognosis.  Appreciate Dr. Phoebe Sharps expertise in explaining stroke physiology to family.  We discussed difference between a aggressive medical intervention path and a palliative, comfort focused care path.  Values and goals of care important to patient and family were attempted to be elicited.  Concept of Hospice discussed  Questions and concerns addressed.   PMT will continue to support holistically.  SUMMARY OF RECOMMENDATIONS   - Full code- Family would like to maintain full code status until he is able to transition home with hospice - Family would like to transition home with hospice support.  Preference is for [facility] as they served other family, but he lives  outside of [place].  Will call to discuss. If [facility] is not option, family would like to work with [facility]  Code Status/Advance Care Planning:  Full code- Plan to remain full code until he transitions home.  Family agreeable to discussion with hospice about establishing DNR once he is home  Palliative Prophylaxis:   Frequent Pain Assessment  Additional Recommendations (Limitations, Scope, Preferences):  Avoid Hospitalization  Psycho-social/Spiritual:   Desire for further Chaplaincy support:no  Additional Recommendations: Education on Hospice  Prognosis:   < 6 months if his disease follows its natural course and he should qualify for home hospice  Discharge Planning: Home with Hospice      Primary Diagnoses: Present on Admission: . Left middle cerebral artery stroke ([place])   I have reviewed the medical record, interviewed the patient and family, and examined the patient. The following aspects are pertinent.  Past Medical History:  Diagnosis Date  . Abnormal peripheral vision of left eye   . Blind right eye   . COPD (chronic obstructive pulmonary disease) (H[facility])     home o2 hs and prn  . Dyspnea    with activity  . 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  ETOH abuse   . HTN (hypertension)   . Peripheral vascular disease ([place])   . Pneumonia 2020  . Stroke [facility])    Social History   Socioeconomic History  . Marital status: Legally Separated    Spouse name: Not on file  . Number of children: Not on file  . Years of education: Not on file  . Highest education level: Not on file  Occupational History  . Not on file  Tobacco Use  . Smoking status: Current Every Day Smoker    Packs/day: 1.50    Years: 9.00    Pack years: 13.50    Types: Cigarettes  . Smokeless tobacco: Never Used  Vaping Use  . Vaping Use: Never used  Substance and Sexual Activity  . Alcohol use: Yes    Alcohol/week: 20.0 standard drinks    Types: 20 Cans of beer per week  . Drug use: Never  .  Sexual activity: Not on file  Other Topics Concern  . Not on file  Social History Narrative  . Not on file   Social Determinants of Health   Financial Resource Strain:   . Difficulty of Paying Living Expenses: Not on file  Food Insecurity:   . Worried About Charity fundraiser in the Last Year: Not on file  . Ran Out of Food in the Last Year: Not on file  Transportation Needs:   . Lack of Transportation (Medical): Not on file  . Lack of Transportation (Non-Medical): Not on file  Physical Activity:   . Days of Exercise per Week: Not on file  . Minutes of Exercise per Session: Not on file  Stress:   . Feeling of Stress : Not on file  Social Connections:   . Frequency of Communication with Friends and Family: Not on file  . Frequency of Social Gatherings with Friends and Family: Not on file  . Attends Religious Services: Not on file  . Active Member of Clubs or Organizations: Not on file  . Attends Archivist Meetings: Not on file  . Marital Status: Not on file   Family History  Problem Relation Age of Onset  . Cancer Mother    Scheduled Meds: . aspirin EC  325 mg Oral Daily   Or  . aspirin  300 mg Rectal Daily  . atorvastatin  80 mg Oral Daily  . ferrous sulfate  325 mg Oral TID WC  . folic acid  1 mg Oral Daily  . midodrine  5 mg Oral TID  . multivitamin with minerals  1 tablet Oral Daily  . pantoprazole (PROTONIX) IV  40 mg Intravenous Q12H  . senna  2 tablet Oral Daily  . thiamine  100 mg Oral Daily   Or  . thiamine  100 mg Intravenous Daily   Continuous Infusions: PRN Meds:.acetaminophen **OR** acetaminophen (TYLENOL) oral liquid 160 mg/5 mL **OR** acetaminophen, senna-docusate, sorbitol Medications Prior to Admission:  Prior to Admission medications   Medication Sig Start Date End Date Taking? Authorizing Provider  acetaminophen (TYLENOL) 325 MG tablet Take 650 mg by mouth every 6 (six) hours as needed for moderate pain or headache.    [provider]  albuterol (PROVENTIL) (2.5 MG/3ML) 0.083% nebulizer solution Take 3 mLs (2.5 mg total) by nebulization every 6 (six) hours as needed for wheezing or shortness of breath. For breathing 09/09/19   Roxan Hockey, MD  aspirin EC 325 MG EC tablet Take 1 tablet (325 mg total) by mouth daily. 08/06/20  Terrilee Croak, MD  atorvastatin (LIPITOR) 40 MG tablet Take 1 tablet (40 mg total) by mouth every evening. For Cholesterol 09/09/19   Roxan Hockey, MD  carvedilol (COREG) 3.125 MG tablet Take 1 tablet (3.125 mg total) by mouth 2 (two) times daily with a meal. 08/05/20   Dahal, Marlowe Aschoff, MD  clopidogrel (PLAVIX) 75 MG tablet Take 1 tablet (75 mg total) by mouth daily. 08/06/20 11/05/20  Terrilee Croak, MD  diphenhydrAMINE HCl, Sleep, (ZZZQUIL) 25 MG CAPS Take 25 mg by mouth at bedtime as needed (sleep).    [provider]  folic acid (FOLVITE) 1 MG tablet Take 1 tablet (1 mg total) by mouth daily. 09/10/19   Roxan Hockey, MD  Multiple Vitamin (MULTIVITAMIN WITH MINERALS) TABS tablet Take 1 tablet by mouth daily. 08/06/20   Dahal, Marlowe Aschoff, MD  senna-docusate (SENOKOT-S) 8.6-50 MG tablet Take 1 tablet by mouth at bedtime as needed for moderate constipation. 08/05/20   Terrilee Croak, MD  thiamine 100 MG tablet Take 1 tablet (100 mg total) by mouth daily. 08/06/20   Terrilee Croak, MD   No Known Allergies Review of Systems  Unable to obtain  Physical Exam  General: Alert, awake, in no acute distress. Largely nonverbal Heart: Tachycardic. No murmur appreciated. Lungs: Good air movement, clear Abdomen: Soft, nontender, nondistended, positive bowel sounds.  Ext: No significant edema Skin: Warm and dry Neuro: Limited exam.  RUE flaccid, RLE extremely limited Vital Signs: BP (!) 179/88 (BP Location: Left Arm)   Pulse (!) 108   Temp 98.1 F (36.7 C) (Oral)   Resp 18   SpO2 95%  Pain Scale: 0-10   Pain Score: 0-No pain   SpO2: SpO2: 95 % O2 Device:SpO2: 95 % O2 Flow Rate: .O2  Flow Rate (L/min): 2 L/min  IO: Intake/output summary:   Intake/Output Summary (Last 24 hours) at 08/18/2020 2220 Last data filed at 08/18/2020 1923 Gross per 24 hour  Intake 340 ml  Output --  Net 340 ml    LBM: Last BM Date: 08/15/20 Baseline Weight:   Most recent weight:       Palliative Assessment/Data:   Flowsheet Rows     Most Recent Value  Intake Tab  Referral Department Hospitalist  Unit at Time of Referral Other (Comment)  [CIR]  Palliative Care Primary Diagnosis Neurology  Date Notified 08/18/20  Palliative Care Type New Palliative care  Reason for referral Clarify Goals of Care  Date of Admission 08/05/20  Date first seen by Palliative Care 08/18/20  # of days Palliative referral response time 0 Day(s)  # of days IP prior to Palliative referral 13  Clinical Assessment  Palliative Performance Scale Score 30%  Psychosocial & Spiritual Assessment  Palliative Care Outcomes  Patient/Family meeting held? Yes  Who was at the meeting? Patient, daughter in law, sons via phone  Palliative Care Outcomes Clarified goals of care, Counseled regarding hospice      Time In: 1500 Time Out: 1620 Time Total: 80 minutes Greater than 50%  of this time was spent counseling and coordinating care related to the above assessment and plan.  Signed by: Micheline Rough, MD   Please contact Palliative Medicine Team phone at [phone] for questions and concerns.  For individual provider: See Shea Evans

## 2020-08-18 NOTE — Progress Notes (Addendum)
Physical Therapy Weekly Progress Note  Patient Details  Name: Nathan Hart MRN: [id number] Date of Birth: 1956/07/24  Beginning of progress report period: August 06, 2020 End of progress report period: August 18, 2020  Today's Date: 08/18/2020 PT Individual Time: 8270-7867 PT Individual Time Calculation (min): 39 min   Patient has met 4 of 16 long term goals. Will adjust long term goals as appropriate at future date after knowing patient status 2/2 medical complications.   Patient continues to demonstrate the following deficits muscle weakness, impaired timing and sequencing, unbalanced muscle activation, decreased coordination and decreased motor planning, decreased visual acuity, decreased visual perceptual skills and decreased visual motor skills and decreased initiation, decreased attention, decreased awareness, decreased problem solving, decreased safety awareness, decreased memory and delayed processing and therefore will continue to benefit from skilled PT intervention to increase functional independence with mobility.  Patient progressing toward long term goals. Pt with fluctuations in performance 2/2 medical complications. Due to this, feel it is appropriate to maintain current goal status of supervision while monitoring patient's response to medical complexity.   PT Short Term Goals Week 1:  PT Short Term Goal 1 (Week 1): STGs=LTGs  Skilled Therapeutic Interventions/Progress Updates:    Received verbal confirmation from [place] PA that patient is appropriate to participate in therapies. Pt received supine in bed, NT present performing linen change. Therapist assist by instructing patient on bed mobility techniques, required maxA for rolling L<>R with HOB flat, totalA for donning hospital gown for time management. Planned to assess orthostatics, BP reading 183/93 (117) HR 97 while supine in bed, Performed supine<>sit with maxA +1, once sitting EOB, pt with flushed appearance and  decreased palor, RUE stiff with tendency to push R and anteriorly out of bed, unable to safely assess BP seated thus returned supine with maxA. Raised HOB dependently to 64deg, BP assessed reading 156/83 (99) HR 101. Pt's family (Stormy) at bedside and pt's [facility] was returned to a level he was comfortable ~40deg. Needs in reach at end of session, further mobility deferred 2/2 BP concerns. RN notified after session of pt's performance and response to mobility.   Therapy Documentation Precautions:  Precautions Precautions: Fall Precaution Comments: legally blind Restrictions Weight Bearing Restrictions: No General: PT Amount of Missed Time (min): 21 Minutes PT Missed Treatment Reason: Patient ill (Comment) (BP concerns, orthostatic) Vital Signs: Therapy Vitals Temp: (!) 97.4 F (36.3 C) Pulse Rate: 97 Resp: 16 BP: (!) 183/93 Patient Position (if appropriate): Lying Oxygen Therapy O2 Device: Room Air  Therapy/Group: Individual Therapy  Alger Simons PT, DPT 08/18/2020, 3:47 PM

## 2020-08-18 NOTE — Progress Notes (Signed)
Dicussed with neurology who feels that neurologic changes due to hypoperfusion from orthostasis. CT head ordered to rule out bleed per Dr. Erlinda Hong. Will hold on CT bx of liver for now.

## 2020-08-18 NOTE — Progress Notes (Signed)
Patient with neurological changes this am on evaluation by PT.  On exam, patient slow to initiate with right inattention with sensory deficits RUE/RLE. Dense hemiplegia on the right with flexor withdrawal RLE. Oriented to self only --unable to state age, DOB or place. Will increase IVF to 100 cc/hr and discuss with neurology for recommendations. .  Discussed with GI--on the floor for evaluation- NO BM since 08/21 so difficulty to determine if GIB ongoing. Will schedule Senna daily for bowel program.

## 2020-08-18 NOTE — Progress Notes (Signed)
Speech Language Pathology Weekly Progress and Session Note  Patient Details  Name: Nathan Hart MRN: [id number] Date of Birth: 1956/09/03  Beginning of progress report period: August 12, 2020 End of progress report period: August 18, 2020  Today's Date: 08/18/2020 SLP Missed Time:  (67) Missed Time Reason: MD hold (Comment) (CT scan and possible neurological changes)  Short Term Goals: Week 2: SLP Short Term Goal 1 (Week 2): STG's=LTG's due to ELOS SLP Short Term Goal 1 - Progress (Week 2): Not met    New Short Term Goals: Week 3: SLP Short Term Goal 1 (Week 3): STG=LTG due to unexpected extended LOS  Weekly Progress Updates: Pt is near goal level, however due to continued arising medical issues performance has fluctuated. SLP facilitated word finding strategies in formal and informal tasks, basic problem solving pertaining to safety awareness and orientation.Pt responses best to use of sentence completion cues for word finding deficits. Pt had been downgraded to QD due to continued medical issues but could still benefit from skilled ST services targeting expressive/recpetive language, orientation, basic problem solving, and short term recall. Education has been on going with pt's son and ex-wife. Palliative services are being consulted and pt is currently on medical hold. Pt would continue to benefit from skilled ST services in order to maximize functional independence and reduce burden of care when medically apporpriate, requiring 24 hour supervision at discharge with continued skilled ST services.      Intensity: Minumum of 1-2 x/day, 30 to 90 minutes Frequency:  (QD) Duration/Length of Stay: TBD medical issues Treatment/Interventions: Cognitive remediation/compensation;Speech/Language facilitation;Environmental controls;Patient/family education;Functional tasks;Cueing hierarchy   Daily Session  Skilled Therapeutic Interventions: Missed 60 minutes of planned treatment time due  to medical hold.    General    Pain    Therapy/Group: Individual Therapy  Mishka Stegemann  [facility] 08/18/2020, 1:09 PM

## 2020-08-18 NOTE — Progress Notes (Signed)
[facility] Gastroenterology Progress Note  Nathan Hart 64 y.o. 1956/10/31  CC:  Upper GI bleeding  Subjective: Patient more confused today.  Knows he is in a hospital but unable to report situation, year, or president.  Unable to report when last BM was.  Per flowsheet, last bowel movement documented was 8/22.  Discussed with RN; she is unaware of any additional BMs since that time.  ROS : Limited by patient status (confusion)  Objective: Vital signs in last 24 hours: Vitals:   08/17/20 2006 08/18/20 0438  BP: (!) 163/76 (!) 158/67  Pulse: 86 99  Resp: 18 18  Temp: 97.9 F (36.6 C) 98.3 F (36.8 C)  SpO2: 98% 97%    Physical Exam:  General:  Lethargic, disoriented, blunt affect  Head:  Normocephalic, without obvious abnormality, atraumatic  Eyes:  Conjunctival pallor, EOMs intact  Lungs:   Clear to auscultation bilaterally, respirations unlabored  Heart:  Regular rate and rhythm, S1, S2 normal  Abdomen:   Soft, non-tender, non-distended, bowel sounds active all four quadrants, no guarding or peritoneal signs  Extremities: Extremities normal, atraumatic, no  edema  Pulses: 2+ and symmetric    Lab Results: Recent Labs    08/17/20 1112 08/18/20 0609  NA 139 137  K 3.8 3.6  CL 108 106  CO2 21* 23  GLUCOSE 113* 117*  BUN 10 8  CREATININE 0.66 0.57*  CALCIUM 8.2* 8.1*   No results for input(s): AST, ALT, ALKPHOS, BILITOT, PROT, ALBUMIN in the last 72 hours. Recent Labs    08/17/20 1625 08/18/20 0609  WBC 6.7 6.3  NEUTROABS 5.8 PENDING  HGB 8.0* 7.6*  HCT 28.1* 26.7*  MCV 81.0 79.5*  PLT 238 216   No results for input(s): LABPROT, INR in the last 72 hours.   Assessment: Upper GI bleeding: EGD 8/24 revealed one 9mm non-bleeding duodenal ulcer with no stigmata of bleeding, LA Grade A esophagitis, and gastritis. -Hgb 7.6 today, decreased from 8.0 yesterday, though no further signs of GI bleeding (no BMs) -BUN normal (8) with Cr 0.57  Recent CVA, now with  worsening confusion and right-sided deficits   Plan: Continue Protonix 40 mg twice daily for 2 months, followed by Protonix 40 mg once daily indefinitely.  Per Dr. Paulita Fujita, risk of rebleeding 50% on Plavix if started in next couple weeks; primary team and neurology team need to weigh risks:benefits of anticoagulation.  Continue to monitor H&H with transfusion as needed to maintain hemoglobin greater than 7.  [facility] will sign off. Please contact us if we can be of any further assistance during this hospital stay.  Salley Slaughter PA-C 08/18/2020, 8:48 AM  Contact #  [phone]

## 2020-08-18 NOTE — Patient Care Conference (Signed)
Inpatient RehabilitationTeam Conference and Plan of Care Update Date: 08/18/2020   Time: 11:18 AM    Patient Name: Nathan Hart      Medical Record Number: [id number]  Date of Birth: [birth year] Sex: Male         Room/Bed: 4W11C/4W11C-01 Payor Info: Payor: MEDICARE / Plan: MEDICARE PART A AND B / Product Type: *No Product type* /    Admit Date/Time:  08/05/2020  4:29 PM  Primary Diagnosis:  Left middle cerebral artery stroke [facility])  Hospital Problems: Principal Problem:   Left middle cerebral artery stroke (HCC) Active Problems:   Anemia   Dyslipidemia   Right hemiparesis (HCC)   Dementia without behavioral disturbance (HCC)   Acute blood loss anemia   Liver lesion    Expected Discharge Date: Expected Discharge Date:  (Pending medical clearance)  Team Members Present: Physician leading conference: Dr. Delice Lesch Care Coodinator Present: Dorien Chihuahua, RN, BSN, CRRN;Other (comment) (Becky Dupress, SW) Nurse Present: Other (comment) Abigail Miyamoto, RN) PT Present: Other (comment) Darrick Meigs [place], PT) OT Present: Willeen Cass, OT PPS Coordinator present : Gunnar Fusi, Novella Olive, PT     Current Status/Progress Goal Weekly Team Focus  Bowel/Bladder   pt is inc. of b and b, lbm 08/15/20  become cont of b and b  assess q shift and prn   Swallow/Nutrition/ Hydration             ADL's             Mobility   supervision bed mobility, minA transfers, minA gait with RW  supervision transfers/gait      Communication   Min-Mod A  Min expressive, Mod receptive  education, word finding strategies and basic to mildly complex language comprehension   Safety/Cognition/ Behavioral Observations  Mod-Max A  Mod A, Max A emergent  education, problem solving, orientation, recall strategies with internal aids and error awareness   Pain   pt has no current complaints of pain  remain pain freee  Assess pain q shioft and prn, administer medication as needed   Skin   no  current skin issues  remain free of breakdown and infection of skin  assess skin q shift and prn     Discharge Planning:  Discharging back home with son. Able to provide 24/7   Team Discussion: Patient is not able to tolerate therapy and declining participation due to not feeling well. Multiple medical procedures and neuro work up for new right hemiparesis and right neglect with hemo and palliative care consult for goals of care pending.  Patient on target to meet rehab goals: no, Medical consults pending  *See Care Plan and progress notes for long and short-term goals.   Revisions to Treatment Plan:  Q day therapy sessions  Teaching Needs: Transfers, toileting, ADL assistance, medications, etc.  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  Barriers to Discharge: Behavior;Medical stability;Other (comments)  Barriers to Discharge Comments: Neurological changes, GI bleed Possible Resolutions to Barriers/Weekly Focus: Therapies, optimize BP meds, follow labs, GI recs, follow Heme/Onc recs, follow Neuro recs   Continued Need for Acute Rehabilitation Level of Care: The patient requires daily medical management by a physician with specialized training in physical medicine and rehabilitation for the following  reasons: Direction of a multidisciplinary physical rehabilitation program to maximize functional independence : Yes Medical management of patient stability for increased activity during participation in an intensive rehabilitation regime.: Yes Analysis of laboratory values and/or radiology reports with any subsequent need for medication adjustment and/or medical intervention. : Yes   I attest that I  was present, lead the team conference, and concur with the assessment and plan of the team.   Dorien Chihuahua B 08/18/2020, 3:46 PM

## 2020-08-18 NOTE — Progress Notes (Signed)
STROKE TEAM PROGRESS NOTE   INTERVAL HISTORY Daughter in law is at bedside. Two sons are on the phone, Dr. Domingo Cocking is at bedside also. Pt has now right hemiplegia, likely eventually decompensated collateral flow. CT no new findings, no hemorrhage.   OBJECTIVE Vitals:   08/18/20 0931 08/18/20 0934 08/18/20 0936 08/18/20 1441  BP: (!) 157/80 (!) 159/80 136/79 (!) 183/93  Pulse: 94 93 97 97  Resp: 18 18 18 16   Temp: 97.8 F (36.6 C) 97.8 F (36.6 C) 97.8 F (36.6 C) (!) 97.4 F (36.3 C)  TempSrc: Oral Oral Oral   SpO2: 95% 96% 96%    CBC:  Recent Labs  Lab 08/17/20 1625 08/18/20 0609  WBC 6.7 6.3  NEUTROABS 5.8 4.9  HGB 8.0* 7.6*  HCT 28.1* 26.7*  MCV 81.0 79.5*  PLT 238 885   Basic Metabolic Panel:  Recent Labs  Lab 08/17/20 1112 08/18/20 0609  NA 139 137  K 3.8 3.6  CL 108 106  CO2 21* 23  GLUCOSE 113* 117*  BUN 10 8  CREATININE 0.66 0.57*  CALCIUM 8.2* 8.1*   Lipid Panel:     Component Value Date/Time   CHOL 102 08/03/2020 0624   TRIG 98 08/03/2020 0624   HDL 35 (L) 08/03/2020 0624   CHOLHDL 2.9 08/03/2020 0624   VLDL 20 08/03/2020 0624   LDLCALC 47 08/03/2020 0624   HgbA1c:  Lab Results  Component Value Date   HGBA1C 6.0 (H) 08/03/2020   Urine Drug Screen:     Component Value Date/Time   LABOPIA NONE DETECTED 08/02/2020 2015   COCAINSCRNUR NONE DETECTED 08/02/2020 2015   LABBENZ NONE DETECTED 08/02/2020 2015   AMPHETMU NONE DETECTED 08/02/2020 2015   THCU NONE DETECTED 08/02/2020 2015   LABBARB NONE DETECTED 08/02/2020 2015    Alcohol Level     Component Value Date/Time   ETH <10 08/02/2020 2015    IMAGING CT HEAD CODE STROKE WO CONTRAST 08/14/2020 IMPRESSION:  1. No acute finding when compared with prior.  2. Expected evolution of recent left occipital and border zone infarcts.  3. Remote right parietal infarct.   CT ANGIO HEAD W OR WO CONTRAST CT ANGIO NECK W OR WO CONTRAST 08/14/2020 IMPRESSION:  1. No emergent vascular  finding. No change from CTA and catheter angiogram earlier this month.  2. 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  3. Diffusely patent right cervical carotid stent.  4. Pulmonary nodules in the apical lungs including a 15 mm left apical subpleural nodule which appears to invade the pleura, primarily worrisome for malignancy/metastatic disease. Recommend dedicated chest CT.   Transthoracic Echocardiogram  08/04/2020 IMPRESSIONS  1. Left ventricular ejection fraction, by estimation, is 55 to 60%. The left ventricle has normal function. The left ventricle demonstrates regional wall motion abnormalities with possible basal inferior hypokinesis. There is mild left ventricular hypertrophy. Left ventricular diastolic parameters are consistent with Grade I diastolic dysfunction (impaired relaxation).  2. Right ventricular systolic function is normal. The right ventricular size is normal. Tricuspid regurgitation signal is inadequate for assessing PA pressure.  3. The mitral valve is normal in structure. No evidence of mitral valve regurgitation. No evidence of mitral stenosis.  4. The aortic valve is tricuspid. Aortic valve regurgitation is not visualized. No aortic stenosis is present.  5. The inferior vena cava is normal in size with greater than 50% respiratory variability, suggesting right atrial pressure of 3 mmHg.  6. Technically difficult study with poor acoustic  windows.   CT CHEST WO CONTRAST 08/16/2020 1. Innumerable (> than 20) solid pulmonary nodules scattered throughout both lungs, all new since 09/07/2019 chest CT, largest a pleural based 1.8 cm nodule in the medial apical left upper lobe with possible associated mediastinal invasion. Findings are most compatible with pulmonary metastases, although the dominant medial apical left upper lobe nodule could represent a primary bronchogenic carcinoma. 2. Indistinct hypodense masslike 8.4 cm left liver dome  focus, new, suspicious for liver metastasis. MRI abdomen without and with IV contrast recommended for further characterization. 3. Complete right middle lobe atelectasis. 4. Two-vessel coronary atherosclerosis. 5. Stable right adrenal adenoma. 6. Aortic Atherosclerosis (ICD10-I70.0) and Emphysema (ICD10-J43.9).   CT HEAD WO CONTRAST  Result Date: 08/18/2020 CLINICAL DATA:  Follow-up stroke.  Right-sided weakness. EXAM: CT HEAD WITHOUT CONTRAST TECHNIQUE: Contiguous axial images were obtained from the base of the skull through the vertex without intravenous contrast. COMPARISON:  Head CT 4 days ago.  MRI 08/02/2020. FINDINGS: Brain: No new CT finding. Chronic small-vessel ischemic changes affect the pons. No focal cerebellar insult. Right cerebral hemisphere shows chronic small-vessel ischemic changes of the white matter and an old right parietal cortical and subcortical infarction. Left hemisphere shows subacute infarction in the left occipital and posterolateral temporal lobe. Mild persistent swelling but no evidence of hemorrhagic transformation. Small acute infarctions shown better by MRI elsewhere extending from front to back in a watershed distribution are subtle by CT, but there is no sign of progression or new insult. Old infarction in the left basal ganglia. No hydrocephalus. No extra-axial collection. Vascular: There is atherosclerotic calcification of the major vessels at the base of the brain. Skull: Negative Sinuses/Orbits: Chronic mucoperiosteal thickening of the right maxillary sinus. No acute sinus inflammation seen. Orbits negative. Other: None IMPRESSION: No new finding. Subacute infarction in the left occipital and posterolateral temporal lobe with mild persistent swelling but no evidence of hemorrhagic transformation. Small subacute infarctions shown better by MRI elsewhere extending from front to back in a watershed distribution are subtle by CT, but there is no sign of progression or new  insult. Electronically Signed   By: Nelson Chimes M.D.   On: 08/18/2020 13:55   CT CHEST ABDOMEN PELVIS W CONTRAST  Result Date: 08/17/2020 CLINICAL DATA:  Evaluate metastatic disease EXAM: CT CHEST, ABDOMEN, AND PELVIS WITH CONTRAST TECHNIQUE: Multidetector CT imaging of the chest, abdomen and pelvis was performed following the standard protocol during bolus administration of intravenous contrast. CONTRAST:  164mL OMNIPAQUE IOHEXOL 300 MG/ML  SOLN COMPARISON:  CT of the chest from the previous day FINDINGS: CT CHEST FINDINGS Cardiovascular: Thoracic aorta and its branches demonstrate atherosclerotic calcification. No aneurysmal dilatation is seen. 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 Pulmonary artery shows a normal branching pattern. No definitive filling defect to suggest pulmonary embolism is noted. Mediastinum/Nodes: Thoracic inlet shows no acute abnormality. Sizable mediastinal adenopathy is noted. No left hilar adenopathy is seen. There is fullness in the right hilum with what appear to be small lymph nodes as well as considerable soft tissue surrounding the middle lobe bronchus centrally. Possibility of underlying mass deserves consideration. The esophagus is within normal limits. Lungs/Pleura: Lungs are again well aerated with diffuse emphysematous changes. There are multiple parenchymal nodules again identified scattered throughout both lungs similar to that seen on the previous day. Largest of these lies medially in the left upper lobe with central decreased attenuation consistent with necrosis.  The overall appearance is similar to that noted on the prior study and again suspicious for underlying neoplasm. The fullness in the right hilum surrounding the right middle lobe bronchus is also suspicious for underlying neoplasm. No sizable effusion is seen. No other focal abnormality is noted. Musculoskeletal: Degenerative  changes of the thoracic spine are noted. No rib abnormality is noted. Old healed rib fractures on the right are seen. No compression deformity is noted. CT ABDOMEN PELVIS FINDINGS Hepatobiliary: The liver demonstrates a large geographic area of decreased attenuation with peripheral enhancement predominately within the left lobe of the liver. Scattered smaller lesions are noted scattered throughout the liver. These changes are again consistent with metastatic disease. Tissue sampling may be helpful for further evaluation. The gallbladder is within normal limits. Pancreas: Unremarkable. No pancreatic ductal dilatation or surrounding inflammatory changes. Spleen: Spleen is mildly prominent although no focal mass is seen. Adrenals/Urinary Tract: The left adrenal gland is within normal limits. Right adrenal gland demonstrates a focal 3.7 by 2.4 cm soft tissue mass lesion similar to that seen prior exam this is stable from prior studies and consistent with a adrenal adenoma. Kidneys demonstrate a normal enhancement pattern. Cystic changes are noted bilaterally. No renal calculi or obstructive changes are seen. Delayed images demonstrate normal excretion of contrast. The bladder is well distended. Stomach/Bowel: Scattered diverticular change of the colon is noted without evidence of diverticulitis. No obstructive or inflammatory changes are seen. No mass lesion is noted. The appendix is within normal limits. The small bowel and stomach are unremarkable. Vascular/Lymphatic: Aortic atherosclerosis. No enlarged abdominal or pelvic lymph nodes. Reproductive: Prostate is unremarkable. Other: No abdominal wall hernia or abnormality. No abdominopelvic ascites. Musculoskeletal: Degenerative changes of lumbar spine are noted most prominent at L4-5. IMPRESSION: CT of the chest: Stable parenchymal nodules bilaterally similar to that seen on the CT from the previous day. Better visualized on today's exam is enhancing soft tissue in  the right hilum enveloping the right middle lobe bronchus with narrowing suspicious for a combination of adenopathy and possible underlying mass. Bronchoscopic evaluation may be helpful. 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 CT of the abdomen and pelvis: Metastatic disease involving primarily the left lobe of the liver although scattered smaller lesions are seen. This is similar to that seen on the prior exam. This again measures approximately 8 cm in greatest dimension. Tissue sampling may be helpful for further evaluation. Stable right adrenal adenoma. 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 Electronically Signed   By: Inez Catalina M.D.   On: 08/17/2020 20:26     ECG - 08/14/20 - SR rate 79 BPM. (See cardiology reading for complete details)   PHYSICAL EXAM  Temp:  [97.4 F (36.3 C)-98.3 F (36.8 C)] 97.4 F (36.3 C) (08/25 1441) Pulse Rate:  [86-99] 97 (08/25 1441) Resp:  [16-18] 16 (08/25 1441) BP: (136-183)/(67-93) 183/93 (08/25 1441) SpO2:  [95 %-98 %] 96 % (08/25 0936)  General - Well nourished, well developed, in no apparent distress.  Ophthalmologic - fundi not visualized due to noncooperation.  Cardiovascular - Regular rhythm and rate.  Mental Status -  Level of arousal and orientation to place were intact. However, not orientated to people, time, or age.  Language including expression, naming, repetition, comprehension was assessed and found intact. Mild dysarthria  Cranial Nerves II - XII - II - legally blind bilaterally, right hemianopia, left visual field about to have LP and HW only.  III, IV, VI - Extraocular movements intact. V - Facial sensation intact bilaterally. VII - Facial movement intact bilaterally. VIII - Hearing & vestibular intact bilaterally. X - Palate elevates symmetrically. XI - Chin turning & shoulder shrug intact bilaterally. XII - Tongue protrusion intact.  Motor Strength - The  patient's strength was normal in LUE and LLE, however, right UE flaccid and right LE slight withdraw to pain.  Bulk was normal and fasciculations were absent.   Motor Tone - Muscle tone was assessed at the neck and appendages and was normal.  Reflexes - The patient's reflexes were symmetrical in all extremities and he had no pathological reflexes.  Sensory - Light touch, temperature/pinprick were assessed and were symmetrical.    Coordination - The patient had normal movements in the left hand with no ataxia or dysmetria.  Tremor was absent.  Gait and Station - deferred.   ASSESSMENT/PLAN Mr. ELVAN EBRON is a 64 y.o. male with history of hypertension, PAD, CVA, COPD, legally blind bilaterally, smoker and alcohol abuse, recent stroke due to left CCA occlusion discharged to [facility] who was called back by rehab attending for worsening right-sided weakness and speech difficulty. He did not receive IV t-PA due to recent stroke and improving deficits.   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 Per GI, Risk of rebleed 50% if plavix resumed in next couple weeks.   8/25 again right hemiplegia -> CT no acute finding  Hb 8.0->7.0 - received 2u PRBC -> 8.9->8.5->8.5->8.4->7.8  Palliative care on board - Dr. Domingo Cocking  Family requested home hospice  Recent stroke 08/02/2020 - left posterior MCA and left PCA infarcts, secondary to left CCA occlusion, large vessel disease source  CT showed left parieto-occipital acute infarct, and old right CR  infarct.   CTA head and neck showed left CCA possible occlusion, and left ICA reconstitution with slow flow. Right ICA stent patent, bilateral siphon high-grade stenosis. Left P2 high-grade stenosis.   CTP 33 cc core with 45 penumbra.  MRI  Acute infarcts involving left posterior greater than anterior circulations, noting presence of a fetal left PCA on the prior CTA  IR left CCA mid to distal occlusion, left MCA and ACA patent, right ICA stent patent.  2D Echo EF 55 to 60%  LDL 47  HgbA1c 6.0  Carotid stenosis  08/2019 cerebral angiogram showed right ICA 90% stenosis, left CCA 50% stenosis, left ICA 50% stenosis.  Left fetal PCA.  Left VA occluded with distal reconstitution  09/2019 status post right carotid stenting with Dr. Estanislado Pandy  This admission CTA head neck left CCA occlusion, right ICA stent patent  This admission cerebral angiogram left CCA mid to distal occlusion, right ICA stent patent  No endovascular procedure indicated, continue medical management.  Long-term BP goal 130-150  Orthostatic hypotension Hx of hypertension  Orthostatic vitals showed sitting 135/76, standing 106/71   On TED hose  On gentle hydration - NS @ 50  On midodrine now 5mg  tid  BP improved 150s  Severe anemia  GIB in setting of aspirin and plavix use  Hb 8.0->7.6->7.8  Stool occult blood POSITIVE  Received PRBC 2U transfusion  CBC showed microcytic anemia  On iron pills   Monitoring CBC  GI consulted - Added PPI, hold off plavix  EGD 8/24 - esophagitis, gastritis, non-bleeding duodenal ulcer.   Hyperlipidemia  Home meds: Lipitor 40  LDL 47, goal < 70  Now on Lipitor 80  Lung and liver metastasis    CTA neck showed Pulmonary nodules in the apical lungs including a 15 mm left apical subpleural nodule which appears to invade the pleura, primarily worrisome for malignancy/metastatic disease.   CT chest -innumerable pulmonary nodules largest at pleural-based  1.8 cm, compatible with pulmonary metastasis.  Indistinct hypodense masslike 8.4 cm left liver dome focus, new, suspicious for liver metastasis.  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 Dr. Domingo Cocking from palliative care also at bedside. Family decided on home hospice.   Neurology will sign off. Please call with questions. Thanks for the consult.   Rosalin Hawking, MD PhD Stroke Neurology 08/18/2020 4:16 PM   To contact Stroke Continuity provider, please refer to [email or web address] After hours, contact General Neurology

## 2020-08-18 NOTE — Progress Notes (Signed)
Occupational Therapy Session Note  Patient Details  Name: Nathan Hart MRN: [id number] Date of Birth: 1956-05-01  Today's Date: 08/18/2020 OT Individual Time: 6812-7517 OT Individual Time Calculation (min): 26 min  and Today's Date: 08/18/2020 OT Missed Time: 34 Minutes Missed Time Reason: Patient ill (comment)   Short Term Goals: Week 2:  OT Short Term Goal 1 (Week 2): STGs = LTGs  Skilled Therapeutic Interventions/Progress Updates:    Care coordination with RN prior to starting session to ensure pt medically stable for session- RN approved and stated pt was feeling down re recent medical events. Pt received supine, very flat affect and minimally conversing with OT. With mod cueing pt came EOB with max A. Attempted stand to change brief but was unable with MAX A. Heavy R and posterior lean EOB became progressively worse EOB. No active movement of the RUE/LE. Not oriented to person, place, time. Pt was returned to supine with max A. This pt is not known to this OT, so RN was consulted for PLOF. She reported this is different than yesterday. Session was terminated and care passed off to RN and PA.   Therapy Documentation Precautions:  Precautions Precautions: Fall Precaution Comments: legally blind Restrictions Weight Bearing Restrictions: No General: General OT Amount of Missed Time: 34 Minutes   Therapy/Group: Individual Therapy  Curtis Sites 08/18/2020, 9:44 AM

## 2020-08-18 NOTE — Progress Notes (Signed)
Physical Therapy Session Note  Patient Details  Name: Nathan Hart MRN: [id number] Date of Birth: [birth year]  Today's Date: 08/18/2020  Pt missed 30 minutes of skilled PT session. As of this AM, pt with acute dense RHB plegia, worsening R neglect/inattention, absent pain withdrawal in RHB, and aphasia. Medical team aware. Clinical decision to hold therapy at this time until further results are known. Made RN aware who was in agreement. Will continue to follow as able and appropriate.  Kaede Clendenen P Mirela Parsley PT, DPT 08/18/2020, 12:02 PM

## 2020-08-18 NOTE — Progress Notes (Signed)
Patient ID: Nathan Hart, male   DOB: 1956-06-06, 64 y.o.   MRN: [id number]   Team Conference Report to Patient/Family  Team Conference discussion was reviewed with the patient and caregiver, including goals, any changes in plan of care and target discharge date.  Patient and caregiver express understanding and are in agreement.  The patient has a target discharge date of  (Pending medical clearance).  Dyanne Iha 08/18/2020, 2:21 PM

## 2020-08-18 NOTE — Progress Notes (Signed)
Spoke with son Horacio Werth in regards to plan liver biopsy.  In light of patient neurological changes family wishes at this time to abort liver biopsy.  RN to alert interventional radiology.

## 2020-08-18 NOTE — Progress Notes (Signed)
0821 pt was found to have Right sided paralysis RLE does have reflex when stroked on sole of foot, pt was oriented only to self (pt's baseline was some confusion at times to place and time) Pt denies any H/A, there is no JVD, no CP, denies nausea, no abdominal pain when palpated, BS hypoactive, pt has not had a BM this morning-monitoring stool for s/sx of GIB. Orthostatic VS done (bed was put into reverse trendelenburg position since patient is unable to stand) pt was positive for orthostatic hypotension. Pt did state he felt dizzy when in reverse trendelenburg position. Pt is pale warm and dry. CT called and 20g PIV inserted into Right forearm for CT contrast. Pt is in bed and is withdrawn and tearful. Pt did not communicate verbally but he did hold my hand for a moment. Pt was given emotional support. Pt remains in bed with 4 SR up (pt agreeable to use of 4 rails when asked) call light in reach and denies any needs at present.

## 2020-08-19 ENCOUNTER — Inpatient Hospital Stay (HOSPITAL_COMMUNITY): Payer: Medicare Other | Admitting: Occupational Therapy

## 2020-08-19 ENCOUNTER — Inpatient Hospital Stay (HOSPITAL_COMMUNITY): Payer: Medicare Other | Admitting: Speech Pathology

## 2020-08-19 ENCOUNTER — Inpatient Hospital Stay (HOSPITAL_COMMUNITY): Payer: Medicare Other

## 2020-08-19 LAB — BASIC METABOLIC PANEL
Anion gap: 8 (ref 5–15)
BUN: 9 mg/dL (ref 8–23)
CO2: 23 mmol/L (ref 22–32)
Calcium: 8.5 mg/dL — ABNORMAL LOW (ref 8.9–10.3)
Chloride: 107 mmol/L (ref 98–111)
Creatinine, Ser: 0.54 mg/dL — ABNORMAL LOW (ref 0.61–1.24)
GFR calc Af Amer: >60 mL/min (ref >60)
GFR calc non Af Amer: >60 mL/min (ref >60)
Glucose, Bld: 127 mg/dL — ABNORMAL HIGH (ref 70–99)
Potassium: 3.8 mmol/L (ref 3.5–5.1)
Sodium: 138 mmol/L (ref 135–145)

## 2020-08-19 LAB — CBC WITH DIFFERENTIAL/PLATELET
Abs Immature Granulocytes: 0.04 10*3/uL (ref 0.00–0.07)
Abs Immature Granulocytes: 0.05 10*3/uL (ref 0.00–0.07)
Basophils Absolute: 0 10*3/uL (ref 0.0–0.1)
Basophils Absolute: 0.1 10*3/uL (ref 0.0–0.1)
Basophils Relative: 1 %
Basophils Relative: 1 %
Eosinophils Absolute: 0.2 10*3/uL (ref 0.0–0.5)
Eosinophils Absolute: 0.2 10*3/uL (ref 0.0–0.5)
Eosinophils Relative: 3 %
Eosinophils Relative: 2 %
HCT: 26.4 % — ABNORMAL LOW (ref 39.0–52.0)
HCT: 28 % — ABNORMAL LOW (ref 39.0–52.0)
Hemoglobin: 7.7 g/dL — ABNORMAL LOW (ref 13.0–17.0)
Hemoglobin: 8.3 g/dL — ABNORMAL LOW (ref 13.0–17.0)
Immature Granulocytes: 1 %
Immature Granulocytes: 1 %
Lymphocytes Relative: 10 %
Lymphocytes Relative: 11 %
Lymphs Abs: 0.6 10*3/uL — ABNORMAL LOW (ref 0.7–4.0)
Lymphs Abs: 0.9 10*3/uL (ref 0.7–4.0)
MCH: 23.3 pg — ABNORMAL LOW (ref 26.0–34.0)
MCH: 24.1 pg — ABNORMAL LOW (ref 26.0–34.0)
MCHC: 29.2 g/dL — ABNORMAL LOW (ref 30.0–36.0)
MCHC: 29.6 g/dL — ABNORMAL LOW (ref 30.0–36.0)
MCV: 80 fL (ref 80.0–100.0)
MCV: 81.2 fL (ref 80.0–100.0)
Monocytes Absolute: 0.4 10*3/uL (ref 0.1–1.0)
Monocytes Absolute: 0.6 10*3/uL (ref 0.1–1.0)
Monocytes Relative: 6 %
Monocytes Relative: 7 %
Neutro Abs: 5.3 10*3/uL (ref 1.7–7.7)
Neutro Abs: 6.4 10*3/uL (ref 1.7–7.7)
Neutrophils Relative %: 79 %
Neutrophils Relative %: 78 %
Platelets: 224 10*3/uL (ref 150–400)
Platelets: 259 10*3/uL (ref 150–400)
RBC: 3.3 MIL/uL — ABNORMAL LOW (ref 4.22–5.81)
RBC: 3.45 MIL/uL — ABNORMAL LOW (ref 4.22–5.81)
RDW: 23.9 % — ABNORMAL HIGH (ref 11.5–15.5)
RDW: 24.5 % — ABNORMAL HIGH (ref 11.5–15.5)
WBC: 6.6 10*3/uL (ref 4.0–10.5)
WBC: 8.2 10*3/uL (ref 4.0–10.5)
nRBC: 0 % (ref 0.0–0.2)
nRBC: 0 % (ref 0.0–0.2)

## 2020-08-19 LAB — H PYLORI, IGM, IGG, IGA AB
H Pylori IgG: 0.19 Index Value (ref 0.00–0.79)
H. Pylogi, Iga Abs: <9 units (ref 0.0–8.9)
H. Pylogi, Igm Abs: <9 units (ref 0.0–8.9)

## 2020-08-19 MED ORDER — PANTOPRAZOLE SODIUM 40 MG PO TBEC: 40.0000 mg | DELAYED_RELEASE_TABLET | Freq: Two times a day (BID) | ORAL | 1 refills | Status: AC

## 2020-08-19 MED ORDER — CARVEDILOL 3.125 MG PO TABS: 3.1250 mg | ORAL_TABLET | Freq: Two times a day (BID) | ORAL | Status: DC

## 2020-08-19 MED ORDER — ATORVASTATIN CALCIUM 80 MG PO TABS: 80.0000 mg | ORAL_TABLET | Freq: Every day | ORAL | 0 refills | Status: AC

## 2020-08-19 MED ORDER — MIDODRINE HCL 2.5 MG PO TABS: 2.5000 mg | ORAL_TABLET | Freq: Three times a day (TID) | ORAL | 0 refills | Status: AC

## 2020-08-19 MED ORDER — MIDODRINE HCL 5 MG PO TABS
2.5000 mg | ORAL_TABLET | Freq: Three times a day (TID) | ORAL | Status: DC
  Administered 2020-08-19: 2.5 mg via ORAL

## 2020-08-19 MED ORDER — MIDODRINE HCL 5 MG PO TABS: 5.0000 mg | ORAL_TABLET | Freq: Three times a day (TID) | ORAL | 0 refills | Status: DC

## 2020-08-19 NOTE — Progress Notes (Signed)
[facility] PHYSICAL MEDICINE & REHABILITATION PROGRESS NOTE  Subjective/Complaints: Appearing frail and weak this morning, but smiling. Discussed with therapy- unable to do car transfer and would benefit from ambulance. Plan for home hospice.   ROS: Limited due to cognition.   Objective: Vital Signs: Blood pressure (!) 166/94, pulse (!) 101, temperature 98.2 F (36.8 C), resp. rate 17, SpO2 94 %. CT HEAD WO CONTRAST  Result Date: 08/18/2020 CLINICAL DATA:  Follow-up stroke.  Right-sided weakness. EXAM: CT HEAD WITHOUT CONTRAST TECHNIQUE: Contiguous axial images were obtained from the base of the skull through the vertex without intravenous contrast. COMPARISON:  Head CT 4 days ago.  MRI 08/02/2020. FINDINGS: Brain: No new CT finding. Chronic small-vessel ischemic changes affect the pons. No focal cerebellar insult. Right cerebral hemisphere shows chronic small-vessel ischemic changes of the white matter and an old right parietal cortical and subcortical infarction. Left hemisphere shows subacute infarction in the left occipital and posterolateral temporal lobe. Mild persistent swelling but no evidence of hemorrhagic transformation. Small acute infarctions shown better by MRI elsewhere extending from front to back in a watershed distribution are subtle by CT, but there is no sign of progression or new insult. Old infarction in the left basal ganglia. No hydrocephalus. No extra-axial collection. Vascular: There is atherosclerotic calcification of the major vessels at the base of the brain. Skull: Negative Sinuses/Orbits: Chronic mucoperiosteal thickening of the right maxillary sinus. No acute sinus inflammation seen. Orbits negative. Other: None IMPRESSION: No new finding. Subacute infarction in the left occipital and posterolateral temporal lobe with mild persistent swelling but no evidence of hemorrhagic transformation. Small subacute infarctions shown better by MRI elsewhere extending from front to  back in a watershed distribution are subtle by CT, but there is no sign of progression or new insult. Electronically Signed   By: Nelson Chimes M.D.   On: 08/18/2020 13:55   CT CHEST ABDOMEN PELVIS W CONTRAST  Result Date: 08/17/2020 CLINICAL DATA:  Evaluate metastatic disease EXAM: CT CHEST, ABDOMEN, AND PELVIS WITH CONTRAST TECHNIQUE: Multidetector CT imaging of the chest, abdomen and pelvis was performed following the standard protocol during bolus administration of intravenous contrast. CONTRAST:  118mL OMNIPAQUE IOHEXOL 300 MG/ML  SOLN COMPARISON:  CT of the chest from the previous day FINDINGS: CT CHEST FINDINGS Cardiovascular: Thoracic aorta and its branches demonstrate atherosclerotic calcification. No aneurysmal dilatation is seen. 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 Pulmonary artery shows a normal branching pattern. No definitive filling defect to suggest pulmonary embolism is noted. Mediastinum/Nodes: Thoracic inlet shows no acute abnormality. Sizable mediastinal adenopathy is noted. No left hilar adenopathy is seen. There is fullness in the right hilum with what appear to be small lymph nodes as well as considerable soft tissue surrounding the middle lobe bronchus centrally. Possibility of underlying mass deserves consideration. The esophagus is within normal limits. Lungs/Pleura: Lungs are again well aerated with diffuse emphysematous changes. There are multiple parenchymal nodules again identified scattered throughout both lungs similar to that seen on the previous day. Largest of these lies medially in the left upper lobe with central decreased attenuation consistent with necrosis. The overall appearance is similar to that noted on the prior study and again suspicious for underlying neoplasm. The fullness in the right hilum surrounding the right middle lobe bronchus is also suspicious for underlying neoplasm. No  sizable effusion is seen. No other focal abnormality is noted. Musculoskeletal: Degenerative changes of the  thoracic spine are noted. No rib abnormality is noted. Old healed rib fractures on the right are seen. No compression deformity is noted. CT ABDOMEN PELVIS FINDINGS Hepatobiliary: The liver demonstrates a large geographic area of decreased attenuation with peripheral enhancement predominately within the left lobe of the liver. Scattered smaller lesions are noted scattered throughout the liver. These changes are again consistent with metastatic disease. Tissue sampling may be helpful for further evaluation. The gallbladder is within normal limits. Pancreas: Unremarkable. No pancreatic ductal dilatation or surrounding inflammatory changes. Spleen: Spleen is mildly prominent although no focal mass is seen. Adrenals/Urinary Tract: The left adrenal gland is within normal limits. Right adrenal gland demonstrates a focal 3.7 by 2.4 cm soft tissue mass lesion similar to that seen prior exam this is stable from prior studies and consistent with a adrenal adenoma. Kidneys demonstrate a normal enhancement pattern. Cystic changes are noted bilaterally. No renal calculi or obstructive changes are seen. Delayed images demonstrate normal excretion of contrast. The bladder is well distended. Stomach/Bowel: Scattered diverticular change of the colon is noted without evidence of diverticulitis. No obstructive or inflammatory changes are seen. No mass lesion is noted. The appendix is within normal limits. The small bowel and stomach are unremarkable. Vascular/Lymphatic: Aortic atherosclerosis. No enlarged abdominal or pelvic lymph nodes. Reproductive: Prostate is unremarkable. Other: No abdominal wall hernia or abnormality. No abdominopelvic ascites. Musculoskeletal: Degenerative changes of lumbar spine are noted most prominent at L4-5. IMPRESSION: CT of the chest: Stable parenchymal nodules bilaterally similar to that seen on  the CT from the previous day. Better visualized on today's exam is enhancing soft tissue in the right hilum enveloping the right middle lobe bronchus with narrowing suspicious for a combination of adenopathy and possible underlying mass. Bronchoscopic evaluation may be helpful. 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 CT of the abdomen and pelvis: Metastatic disease involving primarily the left lobe of the liver although scattered smaller lesions are seen. This is similar to that seen on the prior exam. This again measures approximately 8 cm in greatest dimension. Tissue sampling may be helpful for further evaluation. Stable right adrenal adenoma. 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 Electronically Signed   By: Inez Catalina M.D.   On: 08/17/2020 20:26   Recent Labs    08/18/20 1623 08/19/20 0742  WBC 7.1 6.6  HGB 7.8* 7.7*  HCT 26.2* 26.4*  PLT 223 224   Recent Labs    08/18/20 0609 08/19/20 0742  NA 137 138  K 3.6 3.8  CL 106 107  CO2 23 23  GLUCOSE 117* 127*  BUN 8 9  CREATININE 0.57* 0.54*  CALCIUM 8.1* 8.5*    Physical Exam: BP (!) 166/94   Pulse (!) 101   Temp 98.2 F (36.8 C)   Resp 17   SpO2 94%  General: Alert and oriented x 3, No apparent distress HENT: Head is normocephalic, atraumatic Eyes: EOMI, however blind bilaterally. No discharge. Cardiovascular: No JVD.  RRR. Respiratory: Normal effort.  No stridor.Bilaterally clear to auscultation. GI: Non-distended.  BS +. Skin: Warm and dry.  Intact. Psych: Flat.  Irritated. Musc: No edema in extremities.  No tenderness in extremities. Neuro: Alert Motor: Generalized weakness noted >> RUE/RLE   Assessment/Plan: 1. Functional deficits secondary to left posterior MCA/PCA infarcts which require 3+ hours per day of interdisciplinary therapy in a comprehensive inpatient rehab setting.  Physiatrist is providing close team supervision and 24 hour  management of active medical problems listed below.  Physiatrist and rehab team continue to assess barriers to discharge/monitor patient progress toward functional and medical goals  Care Tool:  Bathing    Body parts bathed by patient: Right arm, Chest, Abdomen, Front perineal area, Face   Body parts bathed by helper: Left arm, Buttocks, Right upper leg, Left upper leg, Right lower leg, Left lower leg Body parts n/a: Buttocks   Bathing assist Assist Level: Moderate Assistance - Patient 50 - 74%     Upper Body Dressing/Undressing Upper body dressing Upper body dressing/undressing activity did not occur (including orthotics): N/A What is the patient wearing?: Pull over shirt    Upper body assist Assist Level: Minimal Assistance - Patient > 75%    Lower Body Dressing/Undressing Lower body dressing    Lower body dressing activity did not occur: N/A What is the patient wearing?: Pants     Lower body assist Assist for lower body dressing: Moderate Assistance - Patient 50 - 74%     Toileting Toileting Toileting Activity did not occur Landscape architect and hygiene only): N/A (no void or bm)  Toileting assist Assist for toileting: Minimal Assistance - Patient > 75% (urinal placement)     Transfers Chair/bed transfer  Transfers assist     Chair/bed transfer assist level: Minimal Assistance - Patient > 75%     Locomotion Ambulation   Ambulation assist      Assist level: Minimal Assistance - Patient > 75% Assistive device: Walker-rolling Max distance: 150   Walk 10 feet activity   Assist     Assist level: Minimal Assistance - Patient > 75% Assistive device: Walker-rolling   Walk 50 feet activity   Assist    Assist level: Minimal Assistance - Patient > 75% Assistive device: Walker-rolling    Walk 150 feet activity   Assist Walk 150 feet activity did not occur: Safety/medical concerns  Assist level: Minimal Assistance - Patient > 75% Assistive  device: Walker-rolling    Walk 10 feet on uneven surface  activity   Assist     Assist level: Minimal Assistance - Patient > 75% Assistive device: Hand held assist   Wheelchair     Assist Will patient use wheelchair at discharge?: No             Wheelchair 50 feet with 2 turns activity    Assist            Wheelchair 150 feet activity     Assist           BP (!) 166/94   Pulse (!) 101   Temp 98.2 F (36.8 C)   Resp 17   SpO2 94%   Medical Problem List and Plan: 1.  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  Continue CIR as tolerated   Plan for home hospice.  2.  Antithrombotics: -DVT/anticoagulation: Lovenox             -antiplatelet therapy: Aspirin 325 mg daily and Plavix 75 mg daily x3 months then aspirin alone 3. Pain Management: Tylenol as needed 4. Mood/dementia: Provide emotional support.              -antipsychotic agents: N/A  -team providing ego support  5. Neuropsych/dementia: This patient is  not capable of making decisions on his own behalf. 6. Skin/Wound Care: Routine skin checks 7.  Fluids/Electrolytes/Nutrition: Routine in and outs. 8.  Hypertension with orthostasis.     coreg  6.25mg  BID---held d/t #1    Vitals:   08/18/20 2100 08/19/20 0506  BP: (!) 179/88 (!) 166/94  Pulse: (!) 108 (!) 101  Resp: 18 17  Temp: 98.1 F (36.7 C) 98.2 F (36.8 C)  SpO2: 95% 94%   On midodrine   IVF initiated  Slightly labile, orthostatics negative on 8/25 8/26: BP and pulse very elevated. Decrease midodrine to 2.5 TID 9.  COPD.  Oxygen dependent.  Check oxygen saturations every shift  8/22 Pulmonary nodule seen on CTA in left apex, 57mm, per son - noted 10 years ago in lung and patient did not want any further workup/follow up  Discussed with heme-onc, CT ordered showing liver  lesions-plan was to biopsy liver on 8/25, however due to acute neurological changes biopsy canceled.  After discussion with son, biopsy definitely on hold. 10.  Legally blind bilaterally.  Patient has assistance of son at home- due to macular degeneration 11.  History of tobacco and alcohol use.  Provide counseling 12.  Hyperlipidemia.    Continue Lipitor 13.  Questionable medical compliance.  Provide counseling with patient and family. 14.?  Acute blood loss anemia  Hemoglobin 7.6 on 8/25  S/p EGD on 8/24, communicated with GI-broad-based duodenal ulcer.  Recommended ASA alone due to increased bleeding risk with dual antiplatelets.  Discussed with neurology, plan to continue aspirin alone.    PPI twice daily x8 weeks 15. Disposition: home hospice by ambulette  LOS: 14 days A FACE TO FACE EVALUATION WAS PERFORMED  Theodor Mustin P Cylus [place] 08/19/2020, 10:09 AM

## 2020-08-19 NOTE — Plan of Care (Signed)
Pt was discharged home with hospice.

## 2020-08-19 NOTE — Plan of Care (Signed)
Pt went home with hospice today.

## 2020-08-19 NOTE — Progress Notes (Addendum)
Patient ID: Nathan Hart, male   DOB: 08/07/1956, 63 y.o.   MRN: [id number]   Sw informed pt will discharge home with hospice today by PA. Sw follow up with Dr. Kirstie Mirza note. Family requesting to discharge home with hospice today 8/26. Sw followed up with [facility] to ensure they could service pt in area. Facility has confirmed they will be area to service pt in [place]. Sw awaiting to hear back from facility to confirm if they are ready for pt to discharge today or not (DME, Medications, etc). SW will confirm once I receive phone call from [facility] [place].  [phone]: [place]

## 2020-08-19 NOTE — Progress Notes (Signed)
Speech Language Pathology Daily Session Note  Patient Details  Name: Nathan Hart MRN: [id number] Date of Birth: 05/07/1956  Today's Date: 08/19/2020 SLP Individual Time: 0830-0900 SLP Individual Time Calculation (min): 30 min  Short Term Goals: Week 3: SLP Short Term Goal 1 (Week 3): STG=LTG due to unexpected extended LOS  Skilled Therapeutic Interventions: Skilled treatment session focused on dysphagia goals. SLP asked to re-evaluate swallowing function due to change in patient's overall medical status and functioning.Oral-motor exam was limited due to patient's inability to follow commands but overall strength and ROM appeared [facility].  Patient consumed thin liquids via straw with a swift swallow response and no overt s/s of aspiration. Patient demonstrated efficient mastication and mild oral residue with solid textures with intermittent use of multiple swallows. Due to patient's current medical status and fluctuating level of function, recommend patient initiate a diet of Dys. 2 textures with thin liquids with full supervision. Patient's discharge plan is now home with hospice and will educate family on current recommendations but also allowing patient to eat per his request for comfort if that is what they wish to do. Of note, today patient was essentially nonverbal and utilized mostly grunts to respond with decreased ability to follow commands. Patient left upright in bed with alarm on and all needs within reach. Continue with current plan of care.      Pain Unable to report, no indications   Therapy/Group: Individual Therapy  [name] 08/19/2020, 9:22 AM

## 2020-08-19 NOTE — Progress Notes (Signed)
Occupational Therapy Discharge Summary  Patient Details  Name: Nathan Hart MRN: [id number] Date of Birth: 06-24-1956  Today's Date: 08/19/2020      Patient has met 0 of 8 long term goals due to medical complications during stay. Pt transferred to hospice care accordingly.  Patient to discharge at overall Total Assist level.  Patient's care partner is independent to provide the necessary physical and cognitive assistance at discharge.    Reasons goals not met: medical complications needing transfer to hospice care.  Recommendation:  Patient will benefit from hospice care due to change in medical status.  Equipment: Tub bench and bedside commode  Reasons for discharge: change in medical status  Patient/family agrees with progress made and goals achieved: Yes  OT Discharge Precautions/Restrictions  Precautions Precautions: Fall Precaution Comments: legally blind Pain Pain Assessment Pain Scale: 0-10 Pain Score: 0-No pain ADL ADL Eating: Dependent Where Assessed-Eating: Bed level Grooming: Dependent Where Assessed-Grooming: Bed level Upper Body Bathing: Dependent Where Assessed-Upper Body Bathing: Bed level Lower Body Bathing: Dependent Where Assessed-Lower Body Bathing: Bed level Upper Body Dressing: Dependent Where Assessed-Upper Body Dressing: Bed level Lower Body Dressing: Dependent Where Assessed-Lower Body Dressing: Bed level Toileting: Dependent Toilet Transfer: Not assessed Toilet Transfer Method: Not assessed Toilet Transfer Equipment: Bedside commode Tub/Shower Transfer: Not assessed Social research officer, government: Not assessed Social research officer, government Method: Heritage manager: Radio broadcast assistant, Grab bars Vision Baseline Vision/History: Legally blind Perception  Perception: Impaired Inattention/Neglect: Does not attend to right visual field Praxis Praxis: Impaired Praxis Impairment Details: Motor  planning;Initiation Cognition Overall Cognitive Status: Impaired/Different from baseline Arousal/Alertness: Awake/alert Orientation Level: Oriented to person Focused Attention: Impaired Focused Attention Impairment: Verbal basic;Functional basic Sustained Attention: Impaired Sustained Attention Impairment: Verbal basic;Functional basic Memory: Impaired Memory Impairment: Storage deficit;Retrieval deficit;Decreased recall of new information;Decreased long term memory;Decreased short term memory Decreased Long Term Memory: Verbal basic;Functional basic Decreased Short Term Memory: Verbal basic;Functional basic Awareness: Impaired Problem Solving: Impaired Problem Solving Impairment: Verbal basic;Functional basic Executive Function: Decision Making;Self Monitoring;Reasoning Reasoning: Impaired Reasoning Impairment: Verbal basic;Functional basic Decision Making: Impaired Decision Making Impairment: Verbal basic;Functional basic Self Monitoring: Impaired Self Monitoring Impairment: Verbal basic;Functional basic Safety/Judgment: Impaired Sensation Sensation Light Touch: Appears Intact Proprioception: Appears Intact Coordination Gross Motor Movements are Fluid and Coordinated: No Fine Motor Movements are Fluid and Coordinated: No Coordination and Movement Description: right hemiplegia Motor  Motor Motor: Hemiplegia;Abnormal postural alignment and control Mobility  Bed Mobility Bed Mobility: Rolling Right;Rolling Left;Right Sidelying to Sit Rolling Right: Maximal Assistance - Patient 25-49% Rolling Left: Maximal Assistance - Patient 25-49% Right Sidelying to Sit: Maximal Assistance - Patient 25-49%  Trunk/Postural Assessment  Cervical Assessment Cervical Assessment: Within Functional Limits Thoracic Assessment Thoracic Assessment: Exceptions to [facility] (rounded shoulders;kyphotic) Lumbar Assessment Lumbar Assessment: Within Functional Limits  Balance Balance Balance Assessed:  No Extremity/Trunk Assessment RUE Assessment RUE Assessment: Not tested LUE Assessment LUE Assessment: Not tested   Nathan Hart 08/19/2020, 3:07 PM

## 2020-08-19 NOTE — Progress Notes (Deleted)
Patient admitted to room 4w14 in bed via [facility] transport. Preferred language english portable translator at bedside to assist with admission. Denies pain or discomfort. Patient educated on use of call bell and when to call for assistance. Bed low call bed with in reach.

## 2020-08-19 NOTE — Progress Notes (Signed)
Patient ID: Nathan Hart, male   DOB: November 30, 1956, 64 y.o.   MRN: [id number]   Sw in formed that pt DME set to deliver today by 12:00-12:30. Son will reach out once DME has been delivered, SW will then set transport.

## 2020-08-19 NOTE — Discharge Instructions (Signed)
Inpatient Rehab Discharge Instructions  Nathan Hart Discharge date and time: No discharge date for patient encounter.   Activities/Precautions/ Functional Status: Activity: activity as tolerated Diet: regular diet Wound Care: none needed Functional status:  ___ No restrictions     ___ Walk up steps independently ___ 24/7 supervision/assistance   ___ Walk up steps with assistance ___ Intermittent supervision/assistance  ___ Bathe/dress independently ___ Walk with walker     _x__ Bathe/dress with assistance ___ Walk Independently    ___ Shower independently ___ Walk with assistance    ___ Shower with assistance ___ No alcohol     ___ Return to work/school ________  COMMUNITY REFERRALS UPON DISCHARGE:    Discharging Home with Hospice: [facility] [phone]  Medical Equipment/Items Ordered: Hospital Bed, Wheelchair, and O2 Concentrator                                                 Agency/Supplier: [facility]    Special Instructions:   No driving smoking or alcohol   STROKE/TIA DISCHARGE INSTRUCTIONS SMOKING Cigarette smoking nearly doubles your risk of having a stroke & is the single most alterable risk factor  If you smoke or have smoked in the last 12 months, you are advised to quit smoking for your health.  Most of the excess cardiovascular risk related to smoking disappears within a year of stopping.  Ask you doctor about anti-smoking medications   Quit Line: 1-800-QUIT NOW  Free Smoking Cessation Classes [phone]  CHOLESTEROL Know your levels; limit fat & cholesterol in your diet  Lipid Panel     Component Value Date/Time   CHOL 102 08/03/2020 0624   TRIG 98 08/03/2020 0624   HDL 35 (L) 08/03/2020 0624   CHOLHDL 2.9 08/03/2020 0624   VLDL 20 08/03/2020 0624   LDLCALC 47 08/03/2020 0624      Many patients benefit from treatment even if their cholesterol is at goal.  Goal: Total Cholesterol (CHOL) less than 160  Goal:   Triglycerides (TRIG) less than 150  Goal:  HDL greater than 40  Goal:  LDL (LDLCALC) less than 100   BLOOD PRESSURE American Stroke Association blood pressure target is less that 120/80 mm/Hg  Your discharge blood pressure is:  BP: (!) 153/65  Monitor your blood pressure  Limit your salt and alcohol intake  Many individuals will require more than one medication for high blood pressure  DIABETES (A1c is a blood sugar average for last 3 months) Goal HGBA1c is under 7% (HBGA1c is blood sugar average for last 3 months)  Diabetes: No known diagnosis of diabetes    Lab Results  Component Value Date   HGBA1C 6.0 (H) 08/03/2020     Your HGBA1c can be lowered with medications, healthy diet, and exercise.  Check your blood sugar as directed by your physician  Call your physician if you experience unexplained or low blood sugars.  PHYSICAL ACTIVITY/REHABILITATION Goal is 30 minutes at least 4 days per week  Activity: Increase activity slowly, Therapies: Physical Therapy: Home Health Return to work:   Activity decreases your risk of heart attack and stroke and makes your heart stronger.  It helps control your weight and blood pressure; helps you relax and can improve your mood.  Participate in a regular exercise program.  Talk with your doctor about the best form  of exercise for you (dancing, walking, swimming, cycling).  DIET/WEIGHT Goal is to maintain a healthy weight  Your discharge diet is:  Diet Order            Diet Heart Room service appropriate? Yes; Fluid consistency: Thin  Diet effective now                 liquids Your height is:    Your current weight is:   Your Body Mass Index (BMI) is:     Following the type of diet specifically designed for you will help prevent another stroke.  Your goal weight range is:    Your goal Body Mass Index (BMI) is 19-24.  Healthy food habits can help reduce 3 risk factors for stroke:  High cholesterol, hypertension, and excess  weight.  RESOURCES Stroke/Support Group:  Call [phone]   STROKE EDUCATION PROVIDED/REVIEWED AND GIVEN TO PATIENT Stroke warning signs and symptoms How to activate emergency medical system (call 911). Medications prescribed at discharge. Need for follow-up after discharge. Personal risk factors for stroke. Pneumonia vaccine given:  Flu vaccine given:  My questions have been answered, the writing is legible, and I understand these instructions.  I will adhere to these goals & educational materials that have been provided to me after my discharge from the hospital.      My questions have been answered and I understand these instructions. I will adhere to these goals and the provided educational materials after my discharge from the hospital.  Patient/Caregiver Signature _______________________________ Date __________  Clinician Signature _______________________________________ Date __________  Please bring this form and your medication list with you to all your follow-up doctor's appointments.

## 2020-08-19 NOTE — Progress Notes (Signed)
Patient ID: Nathan Hart, male   DOB: 1956/01/27, 64 y.o.   MRN: [id number]   Sw received phone call from [place] at [facility]. Informed that DME was currently being delivered and we can move forward setting transport. Patient transportation has been set for [employer]

## 2020-08-19 NOTE — Progress Notes (Signed)
Patient ID: RAM HAUGAN, male   DOB: 08-23-1956, 64 y.o.   MRN: [id number]   Pt packet left at nursing station for Gerald Dexter informed of patient transport.

## 2020-08-19 NOTE — Discharge Summary (Signed)
Physical Therapy Discharge Summary  Patient Details  Name: Nathan Hart MRN: [id number] Date of Birth: 11/30/1956  Today's Date: 08/19/2020   Patient has met 0 of 16 long term goals due to medical complications during stay. Therefore, treatment goals n/a to patient at his current condition. Patient discharged to hospice at home level of care. Patient's care partner is independent to provide the necessary physical and cognitive assistance at discharge.  Reasons goals not met: medical complications requiring transfer to hospice care.   Recommendation:  Patient will benefit from hospice care, due to change in medical condition.  Equipment: Hospital bed  Reasons for discharge: change in medical status  Patient/family agrees with progress made and goals achieved: Yes  PT Discharge Precautions/Restrictions Precautions Precautions: Fall Precaution Comments: legally blind Restrictions Weight Bearing Restrictions: No Vital Signs Therapy Vitals Temp: 97.7 F (36.5 C) Pulse Rate: 99 Resp: 16 BP: (!) 155/80 Patient Position (if appropriate): Lying Oxygen Therapy SpO2: 92 % O2 Device: Room Air Pain Pain Assessment Pain Scale: 0-10 Pain Score: 0-No pain Vision/Perception  Perception Perception: Impaired Inattention/Neglect: Does not attend to right side of body Praxis Praxis: Impaired Praxis Impairment Details: Motor planning;Ideomotor;Initiation  Cognition Overall Cognitive Status: Impaired/Different from baseline Arousal/Alertness: Awake/alert Orientation Level: Oriented to person Attention: Sustained Focused Attention: Impaired Focused Attention Impairment: Verbal basic;Functional basic Sustained Attention: Impaired Sustained Attention Impairment: Verbal basic;Functional basic Memory: Impaired Memory Impairment: Storage deficit;Retrieval deficit;Decreased recall of new information;Decreased short term memory;Decreased long term memory Decreased Long Term  Memory: Verbal basic;Functional basic Decreased Short Term Memory: Verbal basic;Functional basic Awareness: Impaired Problem Solving: Impaired Problem Solving Impairment: Verbal basic;Functional basic Executive Function: Decision Making;Self Monitoring;Reasoning Reasoning: Impaired Reasoning Impairment: Verbal basic;Functional basic Decision Making: Impaired Decision Making Impairment: Verbal basic;Functional basic Self Monitoring: Impaired Self Monitoring Impairment: Verbal basic;Functional basic Safety/Judgment: Impaired Sensation Sensation Light Touch: Impaired by gross assessment Hot/Cold: Impaired by gross assessment Proprioception: Impaired by gross assessment Stereognosis: Impaired by gross assessment Additional Comments: R HB Coordination Gross Motor Movements are Fluid and Coordinated: No Fine Motor Movements are Fluid and Coordinated: No Coordination and Movement Description: right hemiplegia Motor  Motor Motor: Hemiplegia;Abnormal postural alignment and control  Mobility Bed Mobility Bed Mobility: Rolling Right;Rolling Left;Supine to Sit;Sit to Supine Rolling Right: Maximal Assistance - Patient 25-49% Rolling Left: Maximal Assistance - Patient 25-49% Right Sidelying to Sit: Maximal Assistance - Patient 25-49% Supine to Sit: Maximal Assistance - Patient - Patient 25-49% Sit to Supine: Maximal Assistance - Patient 25-49% Locomotion  Gait Ambulation: No. Prior to medical complications, he was ambulated 177f with minA and RW.  Gait Gait: No  Trunk/Postural Assessment  Cervical Assessment Cervical Assessment: Within Functional Limits Thoracic Assessment Thoracic Assessment: Exceptions to [facility]Fill[facility] HospitalLumbar Assessment Lumbar Assessment: Exceptions to WSouth [facility] Rehabilitation HospitalPostural Control Postural Control: Deficits on evaluation Protective Responses: R neglect/inattention  Balance Balance Balance Assessed: No Extremity Assessment  RUE Assessment RUE Assessment: Exceptions to  WTr[facility] EastGeneral Strength Comments: 0/5 LUE Assessment LUE Assessment: Within Functional Limits RLE Assessment RLE Assessment: Exceptions to WNovamed Surgery Center Of Merrillville LLCGeneral Strength Comments: 0/5 LLE Assessment LLE Assessment: Within Functional Limits    Terra Aveni P Harlene Petralia PT 08/19/2020, 3:48 PM

## 2020-08-19 NOTE — Progress Notes (Signed)
Palliative Medicine RN Note: Rec'd a call from Dr Freeman. He met with pt yesterday, and there is a request to get pt home asap with [facility]. He initiated referral last night.  I ensured that there is a TOC order then called [facility]; they do cover his area & will reach out to [name]'s family. I let them know that d/c orders are already in.   G. , RN, BSN, CHPN Palliative Medicine Team 08/19/2020 8:46 AM Office [phone] 

## 2020-08-19 NOTE — Progress Notes (Addendum)
Patient ID: Nathan Hart, male   DOB: 1956-08-27, 64 y.o.   MRN: [id number]   Sw received follow up call from [facility]. Possibility patient could discharge today if facility is able to maintain all DME needs for home set up. DME ordered: Hospital bed, O2 Concentrator and wheelchair. Will receive follow up call once facility can confirm DME will be delivered today. Will be reaching out to [employer].  [phone]: [place]

## 2020-08-19 NOTE — Progress Notes (Signed)
Spoke with [name] (son) he was informed that Corey Harold was here and he was leaving. Pt taken via ambulance home.

## 2020-08-20 NOTE — Progress Notes (Signed)
Speech Language Pathology Discharge Summary  Patient Details  Name: Nathan Hart MRN: [id number] Date of Birth: 05/12/1956  Patient has met 0 of 6 long term goals.  Patient to discharge at overall Total level.   Reasons goals not met: Patient is discharging home with hospice   Clinical Impression/Discharge Summary: Patient has had a chance in medical status with a functional decline, therefore, patient has not made any LTGs this admission. Patient is discharging home with hospice care and f/u intervention is not warranted at this time.   Care Partner:  Caregiver Able to Provide Assistance: Yes  Type of Caregiver Assistance: Physical;Cognitive  Recommendation:  None      Equipment: N/A   Reasons for discharge: Discharged from hospital   Patient/Family Agrees with Progress Made and Goals Achieved: Yes    [place], [place] 08/20/2020, 6:10 AM

## 2020-08-23 ENCOUNTER — Telehealth: Payer: Self-pay | Admitting: *Deleted

## 2020-08-23 NOTE — Telephone Encounter (Signed)
Phone call initially was supposed to be a transitional care call.  Family states patient is bed ridden and under hospice care at home.  DNR and medical directives are in affect.  Patient will not be coming for a transitional care or hospital f/u visit.

## 2020-08-23 NOTE — Progress Notes (Signed)
Inpatient Rehabilitation Care Coordinator  Discharge Note  The overall goal for the admission was met for:   Discharge location: Yes, home  Length of Stay: Yes  Discharge activity level: Yes  Home/community participation: Yes  Services provided included: MD, RD, PT, OT, SLP, RN, CM, TR, Pharmacy, Neuropsych and SW  Financial Services: Medicare  Follow-up services arranged: Other: Home with Hospice  Comments (or additional information):  Patient/Family verbalized understanding of follow-up arrangements: Yes  Individual responsible for coordination of the follow-up plan: [name] [phone]  Confirmed correct DME delivered: Dyanne Iha 08/23/2020    Dyanne Iha

## 2020-09-02 ENCOUNTER — Encounter: Payer: Medicare Other | Admitting: Registered Nurse

## 2020-09-04 IMAGING — US US CAROTID DUPLEX BILAT
1 series · 13 of 24 positions shown · non-contrast
Comparison: None.

CLINICAL DATA: Hypertension, syncope, hyperlipidemia

EXAM:
BILATERAL CAROTID DUPLEX ULTRASOUND
TECHNIQUE: Gray scale imaging, color Doppler and duplex ultrasound were
performed of bilateral carotid and vertebral arteries in the neck.

[Series 1: us carotid duplex bilat · 0.06mm/px · 13 of 86 slices shown]
[im 1/86]
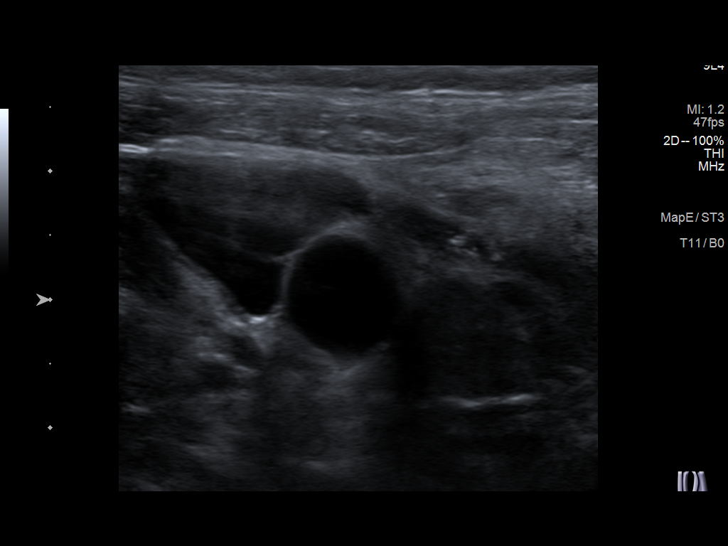
[im 8/86]
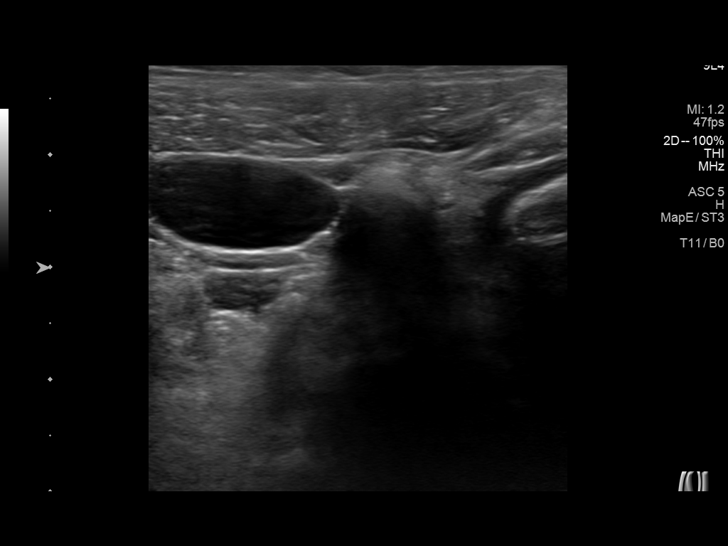
[im 15/86]
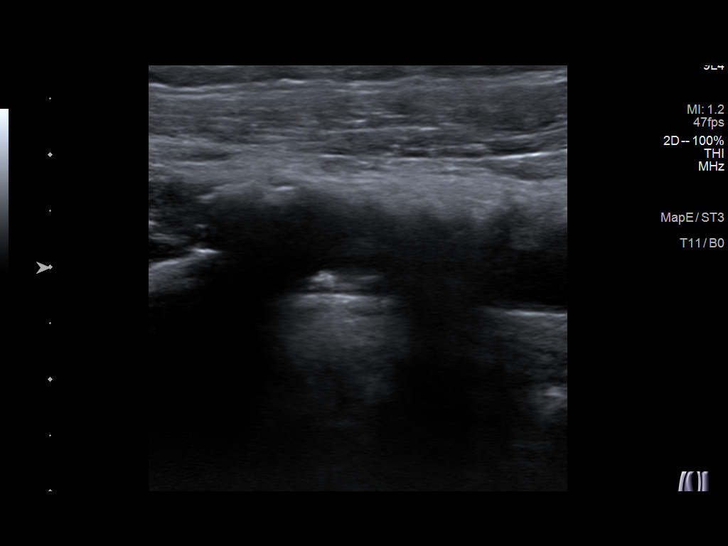
[im 23/86]
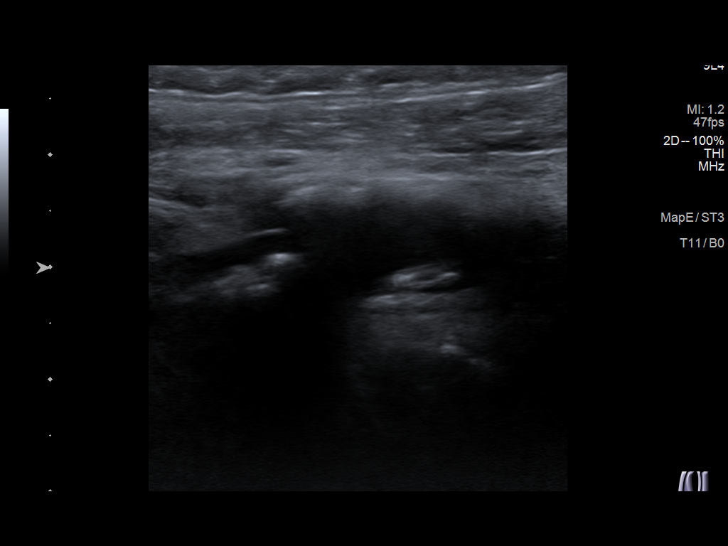
[im 30/86]
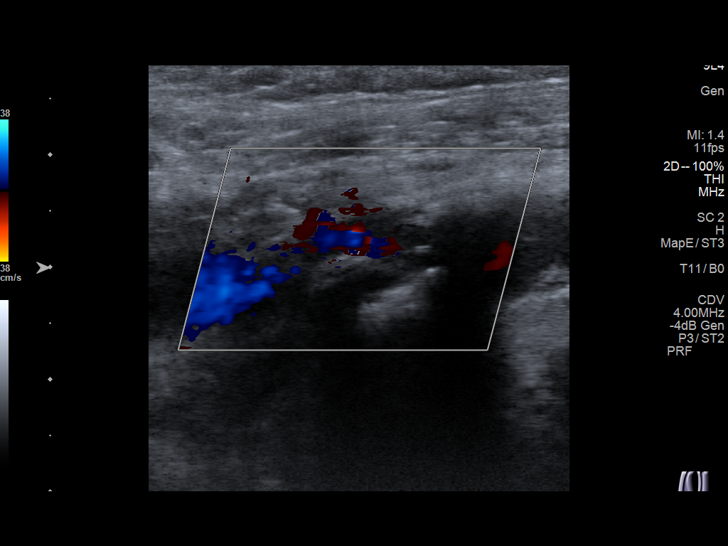
[im 37/86]
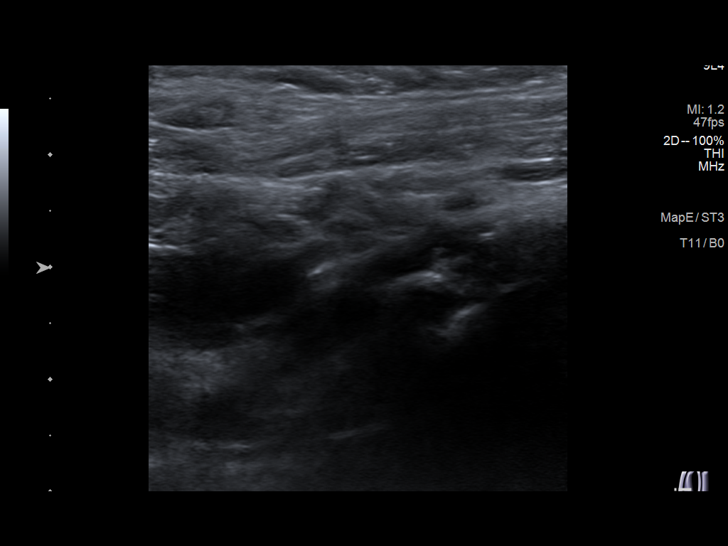
[im 45/86]
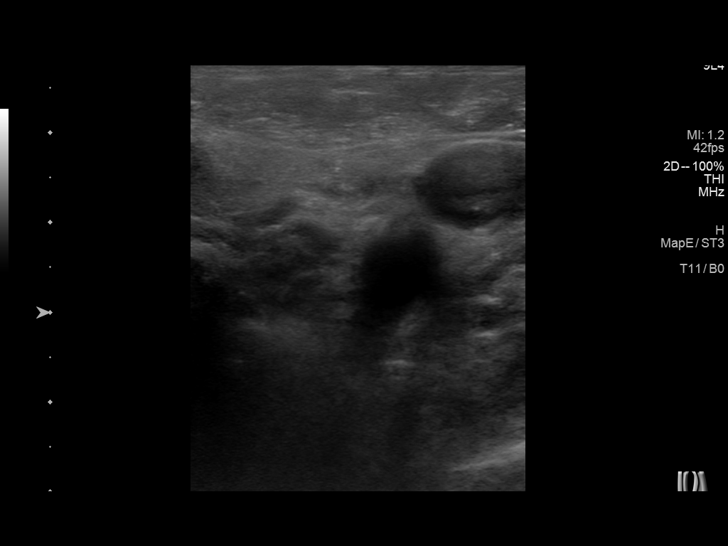
[im 49/86]
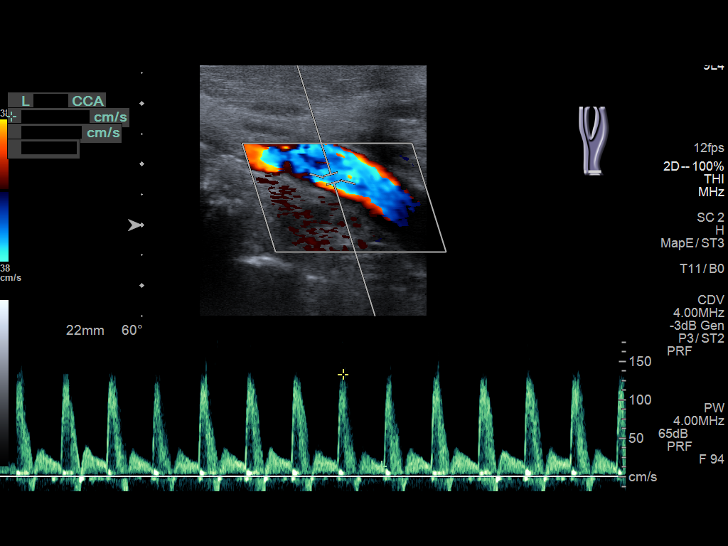
[im 56/86]
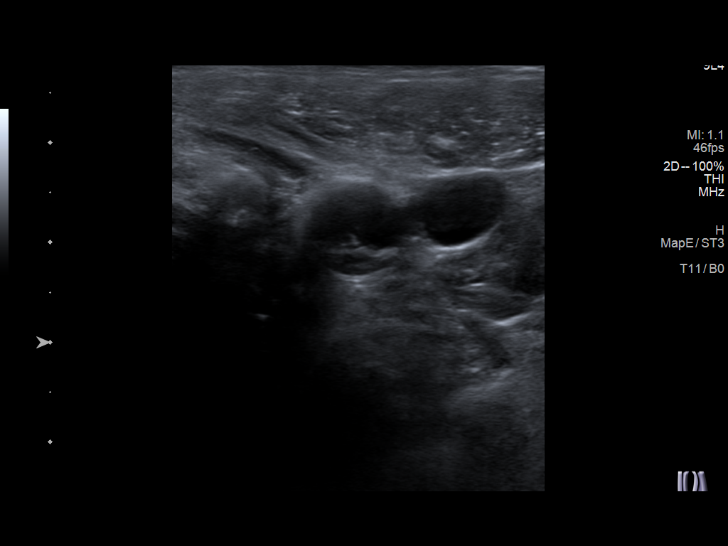
[im 63/86]
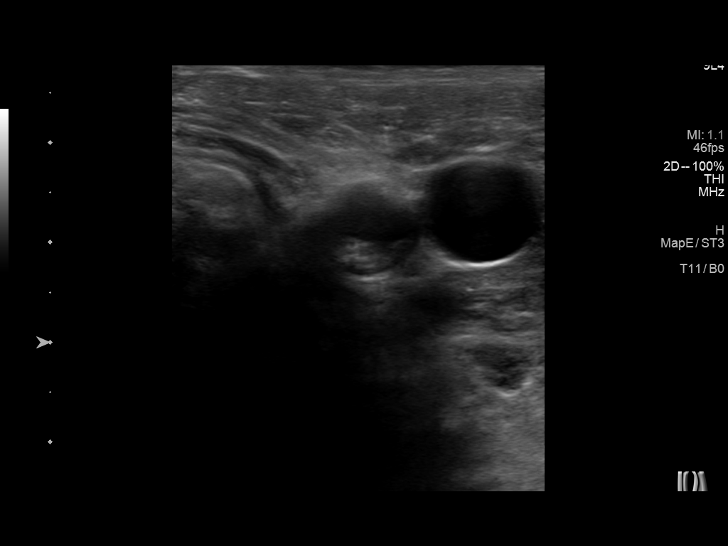
[im 71/86]
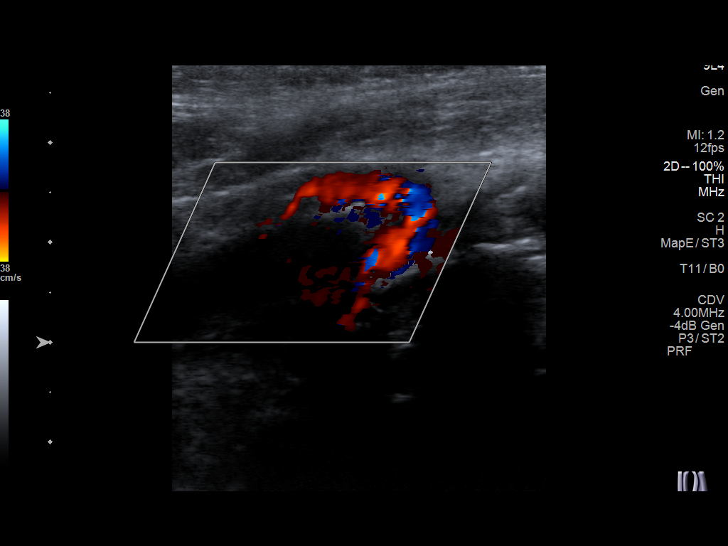
[im 78/86]
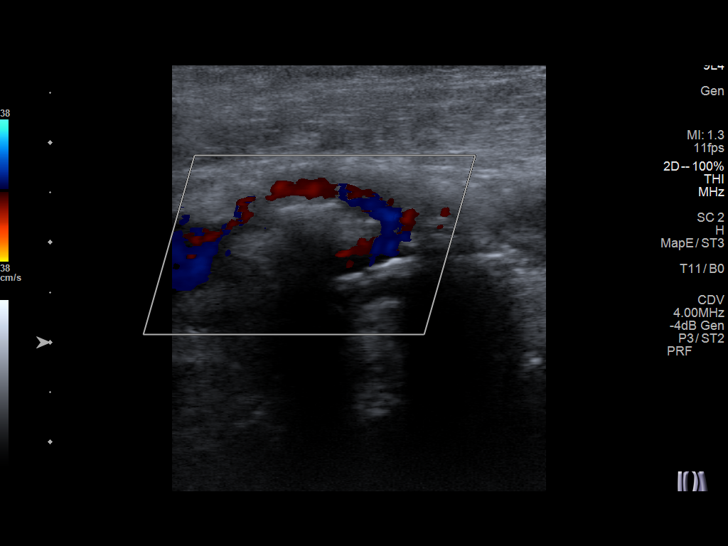
[im 86/86]
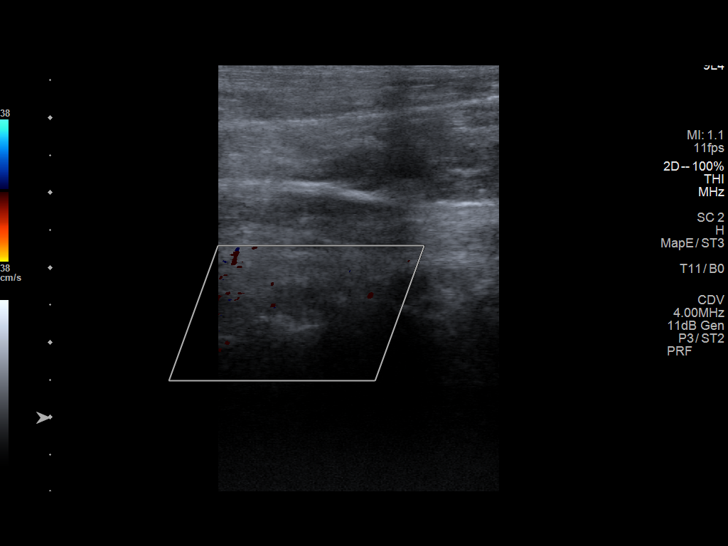

[13 of 24 positions shown; findings below may reference images not displayed]

FINDINGS: Criteria: Quantification of carotid stenosis is based on velocity
parameters that correlate the residual internal carotid diameter
with NASCET-based stenosis levels, using the diameter of the distal
internal carotid lumen as the denominator for stenosis measurement.

The following velocity measurements were obtained:

RIGHT

ICA: 425/69 cm/sec

CCA: 116/14 cm/sec

SYSTOLIC ICA/CCA RATIO:

ECA: 445 cm/sec

LEFT

ICA: 351/46 cm/sec

CCA: 133/13 cm/sec

SYSTOLIC ICA/CCA RATIO:

ECA: 328 cm/sec

RIGHT CAROTID ARTERY: Extensive carotid bifurcation atherosclerosis.
Marked velocity elevation in the proximal right ICA measuring 425/69
centimeters/second. Exam is difficult because of the degree of
calcification. Right ICA stenosis estimated at greater than 70% by
ultrasound criteria.

RIGHT VERTEBRAL ARTERY:  Antegrade

LEFT CAROTID ARTERY: Extensive left carotid bifurcation
atherosclerosis. Degree of calcification limits the exam. Proximal
left ICA velocity elevation measures 351/46 centimeters/second with
turbulent flow noted. Left ICA stenosis also estimated greater than
70%.

LEFT VERTEBRAL ARTERY:  Not visualized, likely occluded
IMPRESSION: Extensive bilateral carotid atherosclerosis.

Bilateral ICA stenoses estimated at greater than 70% by ultrasound
criteria.

Patent antegrade right vertebral artery.

Suspect occluded left vertebral artery.

These results will be called to the ordering clinician or
representative by the Radiologist Assistant, and communication
documented in the PACS or zVision Dashboard.

## 2020-09-21 IMAGING — XA IR INTRAVSC STENT CERV CAROTID W/ EMB-PROT
1 series · 9 of 24 positions shown · IV contrast (IODINE)
Comparison: CT angiogram of the head and neck September 08, 2019.

CLINICAL DATA: History of severe right internal carotid artery
stenosis proximally.

Patient legally blind in the left eye severely decreased vision in
the left eye. History of severe COPD.
EXAM:
IR ANGIO VERTEBRAL SEL SUBCLAVIAN INNOMINATE UNI LEFT MOD SED
TECHNIQUE: Informed written consent was obtained from the patient after a
thorough discussion of the procedural risks, benefits and
alternatives. All questions were addressed. Maximal Sterile Barrier
Technique was utilized including caps, mask, sterile gowns, sterile
gloves, sterile drape, hand hygiene and skin antiseptic. A timeout
was performed prior to the initiation of the procedure.

[Series 300: dr. (person_name) · 9 of 192 slices shown]
[im 9/192]
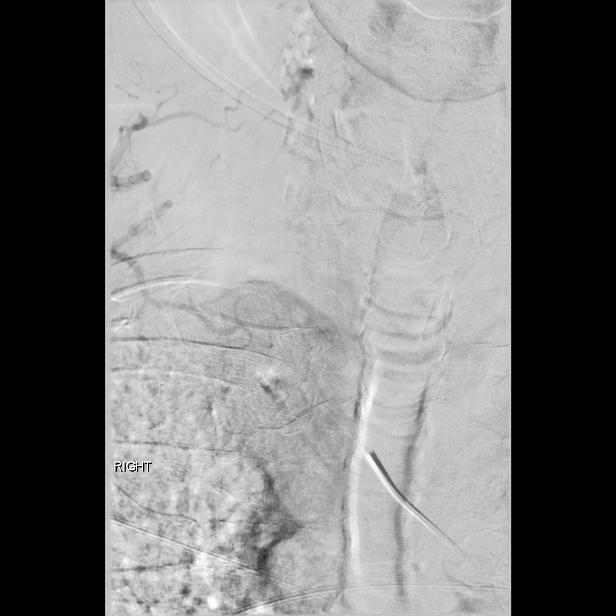
[im 34/192]
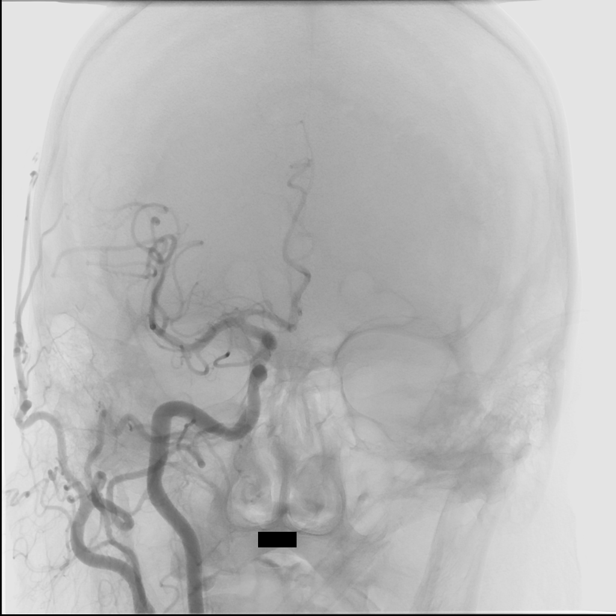
[im 50/192]
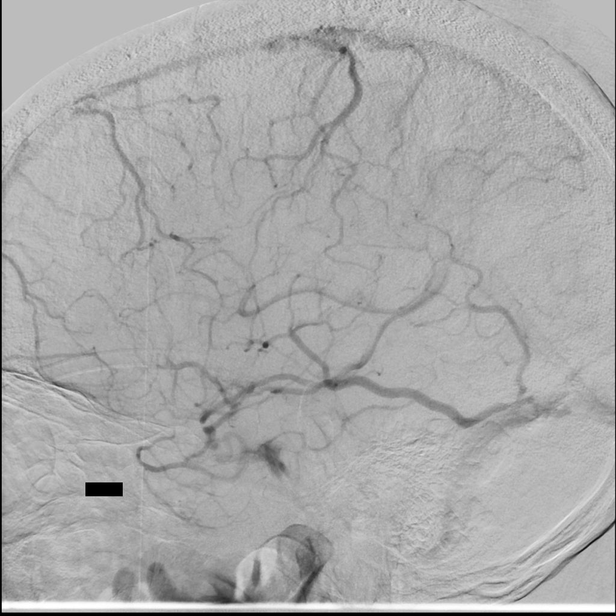
[im 75/192]
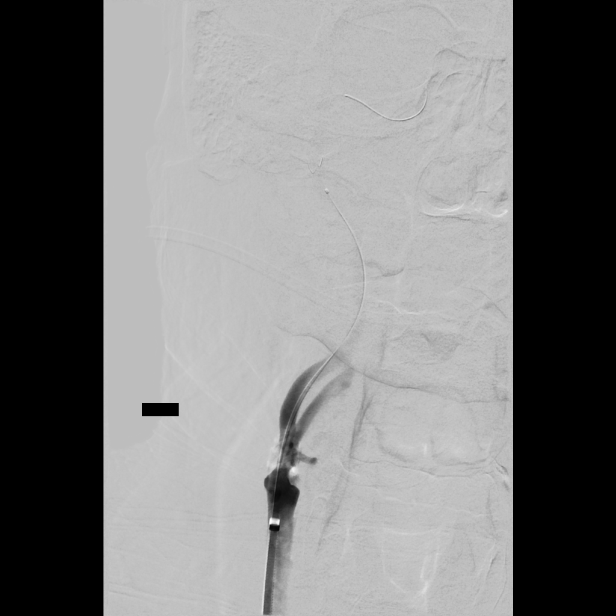
[im 100/192]
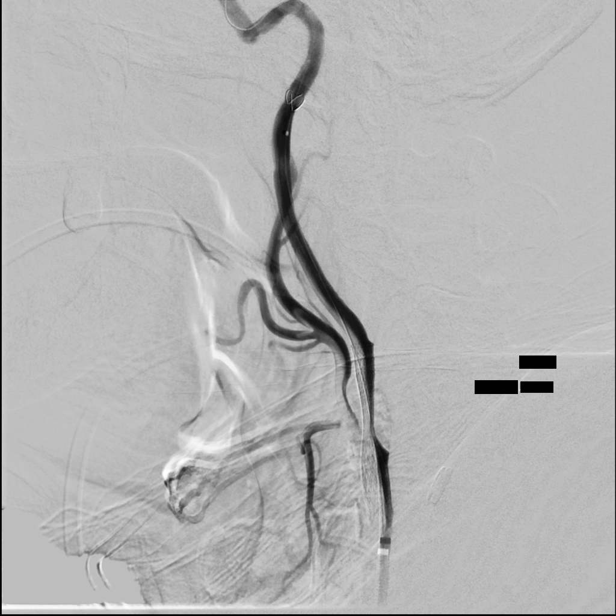
[im 117/192]
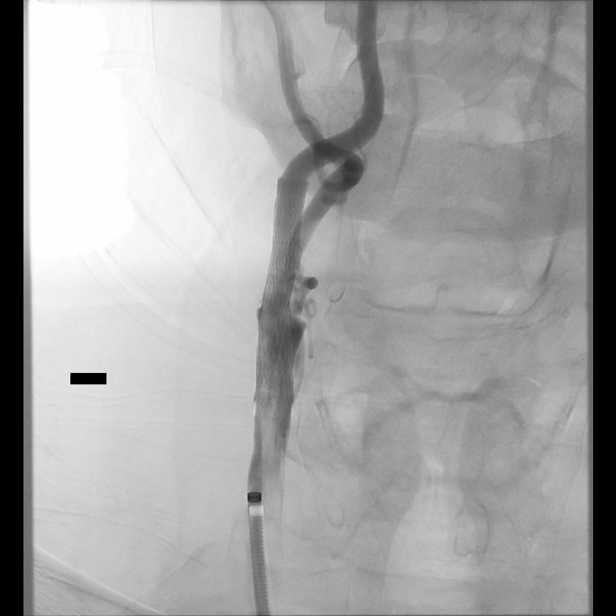
[im 142/192]
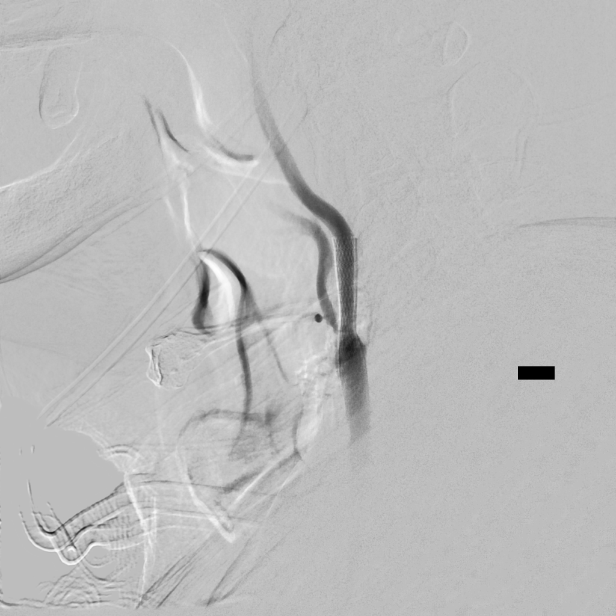
[im 167/192]
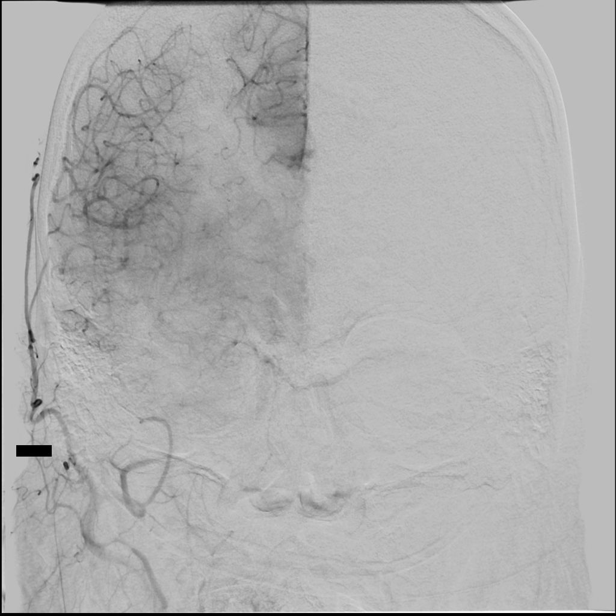
[im 183/192]
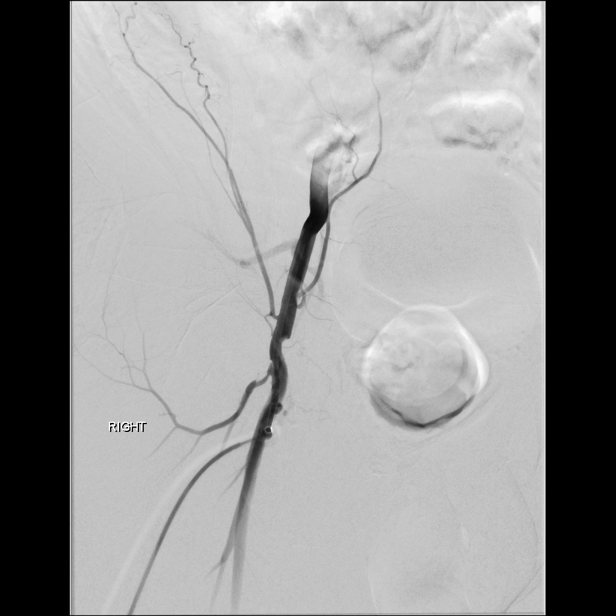

[9 of 24 positions shown; findings below may reference images not displayed]

MEDICATIONS:
Heparin 3,000 units IV; Ancef 2 g IV antibiotic was administered
within 1 hour of the procedure.

ANESTHESIA/SEDATION:
Mac anesthesia as per the [REDACTED] at [REDACTED].

CONTRAST:  Isovue 300 approximately 65 mL.

FLUOROSCOPY TIME:  Fluoroscopy Time: 34 minutes 50 seconds (820
mGy).

COMPLICATIONS:
None immediate.
The right groin was prepped and draped in the usual sterile fashion.
Thereafter using modified Seldinger technique, transfemoral access
into the right common femoral artery was obtained without
difficulty. Over a 0.035 inch guidewire, a 5 French Pinnacle sheath
was inserted. Through this, and also over 0.035 inch guidewire, a 5
French JB 1 catheter was advanced to the aortic arch region and
selectively positioned in the innominate artery, and the right
common carotid artery.
FINDINGS: Innominate artery angiogram demonstrates the origin of the right
common carotid artery and the right vertebral artery to be widely
patent.

The right vertebral artery is seen to opacify to the cranial skull
base. Wide patency is seen of the right vertebrobasilar junction and
the right posterior-inferior cerebellar artery.

The visualized basilar artery, the posterior cerebral artery, the
superior cerebellar arteries and the anterior inferior cerebellar
arteries on the lateral projection demonstrate wide patency into the
delayed arterial phase.

The right common carotid arteriogram demonstrates atherosclerotic
disease involving the right common carotid bifurcation.

There is a moderately severe stenosis of the right external carotid
artery. Its branches, however, opacify.

The right internal carotid artery at the bulb demonstrates severe
segmental stenosis, approximately 85-90% secondary to a smooth
calcified atherosclerotic plaque without evidence of intraluminal
filling defects or of ulcerations. Distal to this the right internal
carotid artery is seen to opacify to the cranial skull base. Wide
patency is seen of the petrous, cavernous and the supraclinoid right
ICA.

The right middle and the right anterior cerebral arteries opacify
into the capillary and venous phases.

There is delayed exit of contrast in the capillary phase of the
right cerebral hemisphere.

ENDOVASCULAR RIGHT VASCULARIZATION OF THE SEVERELY STENOTIC RIGHT
INTERNAL CAROTID ARTERY PROXIMALLY.

The diagnostic JB 1 catheter in the right common carotid artery was
exchanged over a 0.035 inch 300 cm Rosen exchange guidewire for an
80 cm 6 Garcia Monteiro Taliya using biplane roadmap technique
and constant fluoroscopic guidance. Good aspiration was obtained
from the hub of the Meny Marian. A gentle contrast injection
again demonstrated no evidence of vasospasm, dissections or of
intraluminal defects. No change was seen in the severely stenosed
right internal carotid artery proximally.

Measurements were then performed of the right internal carotid
artery and its normal appearance distal to the severe stenosis, and
also the right common carotid artery.

A [DATE] NAV emboshield filter was then prepped and purged with
heparinized saline infusion in its housing.

The filter was then cleared of the air and captured into the
delivery catheter.

The delivery microcatheter with the micro guidewire were then
retrieved from the housing. A J configuration was given to the
distal end of the 014 inch micro guidewire.

Thereafter, using biplane roadmap technique and constant
fluoroscopic guidance, in a coaxial manner and with constant
heparinized saline infusion, the combination was navigated to the
distal end of the Meny Marian. Using a torque device, the
micro guidewire was then gently advanced without difficulty through
severely tight right internal carotid artery proximal stenosis. The
combination was then advanced into the horizontal petrous segment.
The filter was then deployed in the usual manner in the cervical
petrous junction without difficulty.

The delivery microcatheter was retrieved and removed. A control
arteriogram performed through the Meny Marian demonstrated
safe positioning of the filter in the distal right internal carotid
artery without evidence of dissections or spasm.

A 4 mm x 30 mm Viatrac 14 angioplasty microcatheter was then prepped
and purged with 50% contrast and 50% heparinized saline infusion.

Using the rapid exchange technique, this was again advanced without
difficulty and positioned adequate distance at the site of the
severe right ICA stenosis.

Control inflation was then performed using micro inflation syringe
device via micro tubing to approximately 10 atmospheres achieving a
diameter of approximately 4.1 mm where it was maintained for
approximately 20 seconds. At this time, the patient's heart rate
dropped to approximately 30 beats per minute without clinical
consequence. This returned to normal on deflation of the balloon
catheter and its removal.

Control arteriogram performed through the Meny Marian
following the angioplasty demonstrated moderately improved caliber
and flow through the angioplastied segment. At this time, a 6 mm/8
mm x 40 mm Xact stent apparatus was purged with heparinized saline
infusion retrogradely.

Using the rapid exchange technique, the delivery apparatus of the
stent was then advanced to the distal end covering the proximal and
the distal portions of the angioplastied segment of the right
internal carotid artery.

This stent was then deployed in the usual manner without difficulty.
The delivery apparatus was retrieved and removed. Filter wire and
the filter remained stable in the distal right internal carotid
artery. A control arteriogram performed through the Dudut Elsie
Isaiah into the right common carotid artery demonstrated
significantly improved caliber and flow through the stented segment
of the right internal carotid artery. There continued to be a
moderate waist. This prompted the advancement of a 5 mm x 30 mm
Viatrac 14 balloon angioplasty microcatheter which had been prepped
and purged as described previously.

This was advanced again using the rapid exchange technique and
positioned with the distal and proximal markers adequate distance
from the site of the waist. Slow control inflation was then
performed using a micro inflation syringe device and micro tubing.
Inflation was continued to just over 8 atmospheres where it was
maintained for approximately 30 seconds. Again there was slow
decrease in the heart rate to the mid 30s without clinical
consequence. The deflated balloon was then retrieved and removed. A
control arteriogram performed through the Meny Marian
demonstrated excellent flow and caliber of the angioplastied stented
segment of the right internal carotid artery.

A [DATE] filter capture catheter which had been prepped with
heparinized saline infusion was then advanced over the exchange
micro guidewire without difficulty to the proximal marker of the
filter device. The filter was captured into the filter capture
microcatheter by pulling on the micro guidewire. Once completely
captured, the micro filter with the filter capture device was then
retrieved and removed. A control arteriogram performed through the
Meny Marian in the right common carotid artery demonstrated
excellent flow through the stented segment of the right internal
carotid artery with near normal caliber. Intracranially no evidence
of intraluminal filling defects or of occlusion was seen.

Control arteriogram was then performed at 15 and 30 minutes after
the second angioplasty. These continued to demonstrate excellent
flow through the stented segment without evidence of intra stent
irregularities, or of intracranial filling defects or of occlusions.

During this time, the patient remained asymptomatic.

Patient's ACT was maintained in the region of approximately 109
seconds throughout the procedure.

The Meny Marian was then retrieved into the abdominal aorta
and exchanged over a J-tip guidewire for 6 French Pinnacle sheath
which in turn was then removed with manual compression held in the
right groin for hemostasis.

Distal pulses remained palpable in the dorsalis pedis and posterior
tibial on the left, and palpable posterior tibial and Dopplerable
dorsalis pedis on the right side unchanged from prior to the
procedure.

Patient was then transferred to the recovery and then to neuro ICU
to continue with low-dose IV heparin, close neurologic observations
and control of the patient's blood pressure.

Overnight the patient had no clinical or systemic issues.

The following day the IV heparin was stopped and the patient was
switched to aspirin 81 mg a day, and Plavix 75 mg a day.

The patient's right groin remained soft with no change in his distal
pulses.

He was able to the eat independently and ambulate prior to discharge
home under the care of his spouse. The patient was instructed to
maintain adequate hydration by drinking water and to continue his
home meds including the aspirin and Plavix.

He was advised to refrain from driving, or stooping or lifting
weights above 10 pounds for 2 weeks.

Should the patient develop symptoms of left-sided numbness,
tingling, facial droop, speech difficulties or gait abnormalities he
was advised to call [phone].

Patient expressed understanding and agreement with the above
management plan.
IMPRESSION: Status post endovascular revascularization of severely stenosed
right internal carotid proximally with stent assisted angioplasty
with distal protection as described without event.

PLAN:
Follow-up in clinic in 2 weeks time.

## 2020-09-24 DEATH — deceased

## 2020-09-28 ENCOUNTER — Inpatient Hospital Stay: Payer: Medicare Other | Admitting: Internal Medicine

## 2021-08-14 IMAGING — CT CT CHEST W/O CM
2 of 4 series · 15 of 36 positions shown, 18 images · non-contrast
Comparison: 08/14/2020 head and neck CT angiogram. 09/07/2019 chest
CT angiogram.

CLINICAL DATA: COPD, oxygen dependent. Follow-up apical pulmonary
nodule seen on recent neck CT angiogram.

EXAM:
CT CHEST WITHOUT CONTRAST
TECHNIQUE: Multidetector CT imaging of the chest was performed following the
standard protocol without IV contrast.

[Series 3: chest w/o 2mm st · axial · non-contrast · 0.91mm/px · z∈[+1228,+1564]mm · 12 of 194 slices shown, 15 images]
[im 13/194  mediastinal]
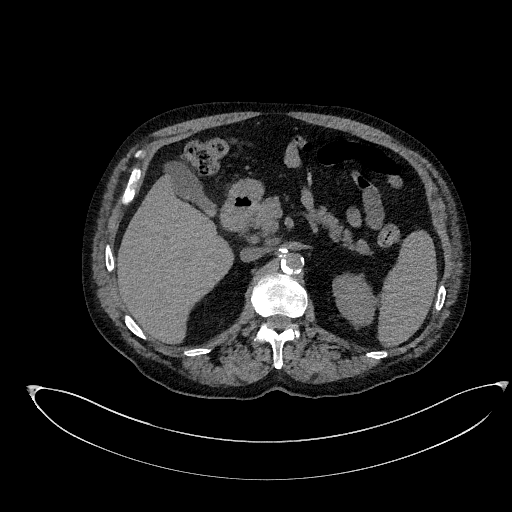
[im 13/194  lung]
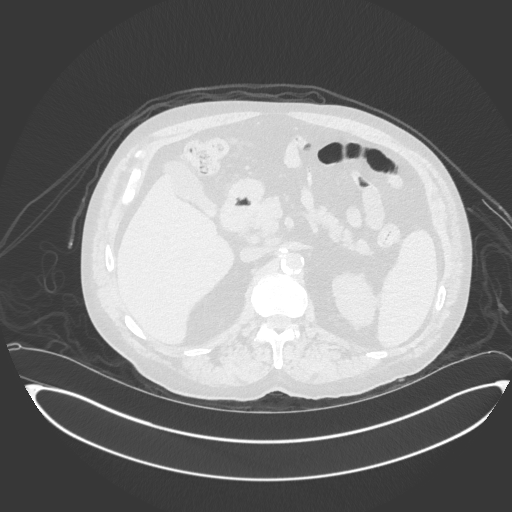
[im 26/194  lung]
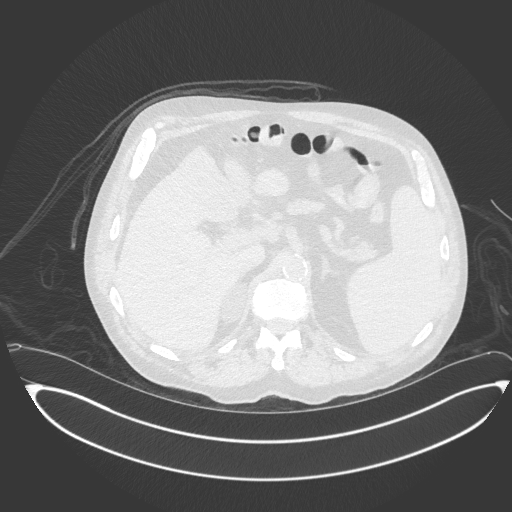
[im 39/194  lung]
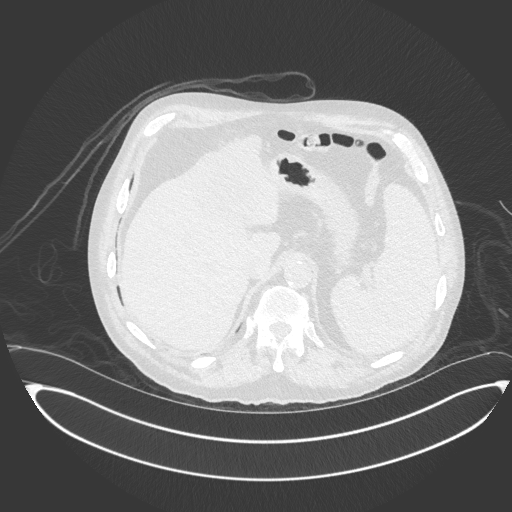
[im 65/194  lung]
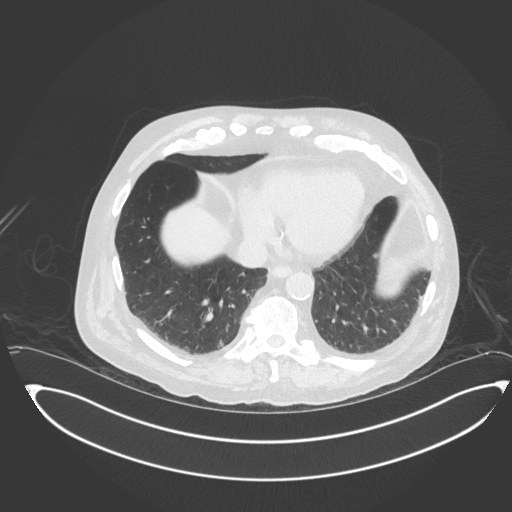
[im 78/194  mediastinal]
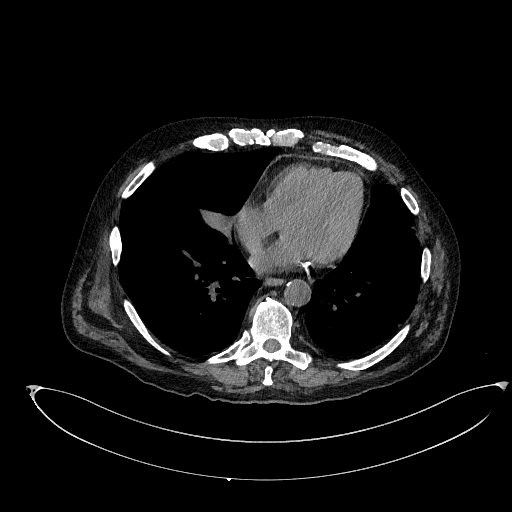
[im 78/194  lung]
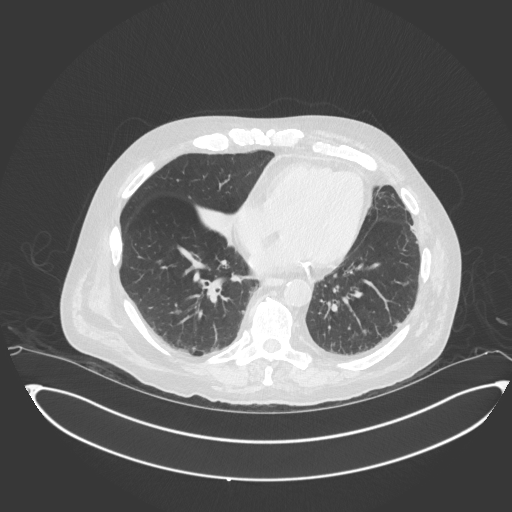
[im 91/194  lung]
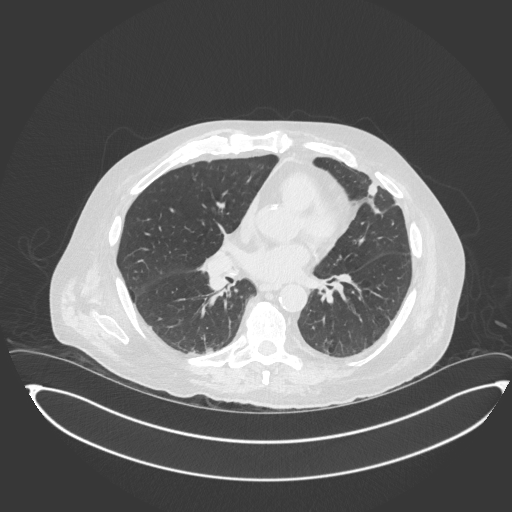
[im 103/194  lung]
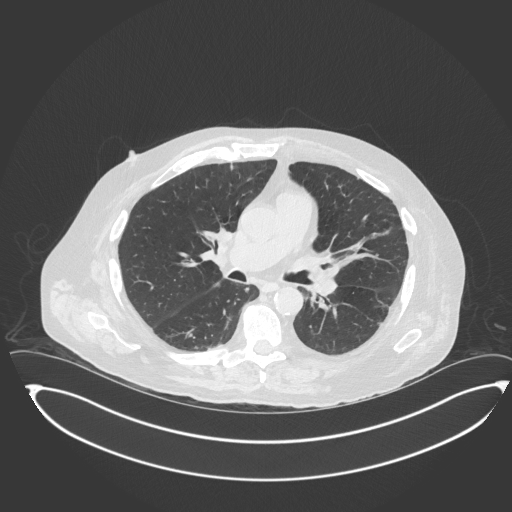
[im 116/194  lung]
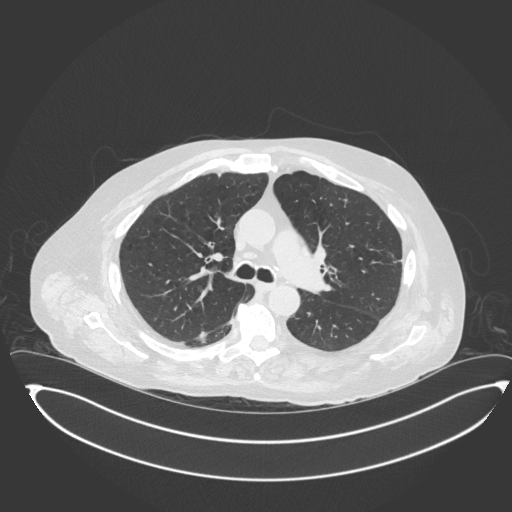
[im 129/194  mediastinal]
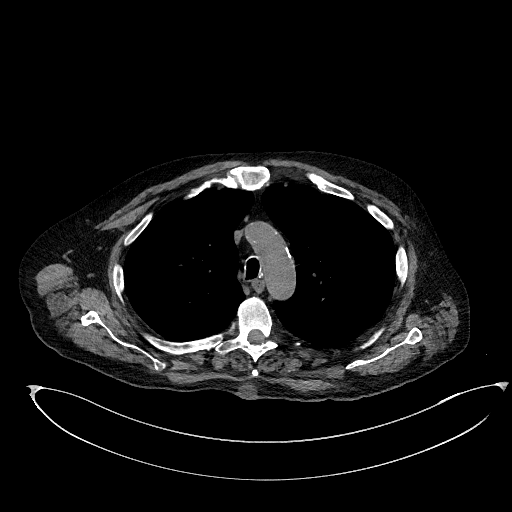
[im 129/194  lung]
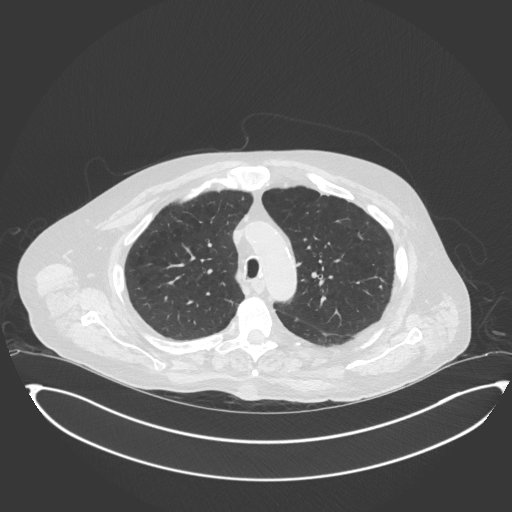
[im 155/194  lung]
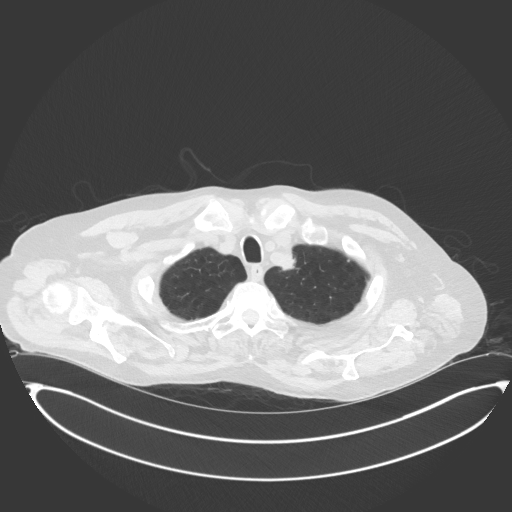
[im 168/194  lung]
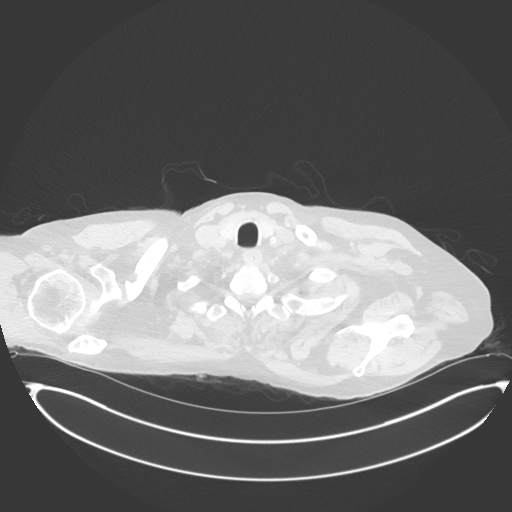
[im 181/194  lung]
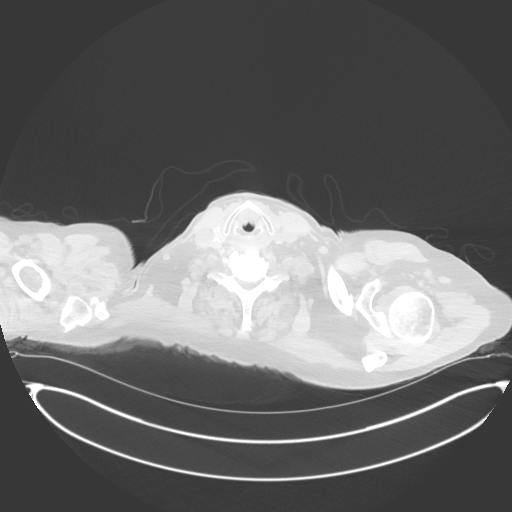

[Series 6: chest w/o 2mm st cor · coronal · non-contrast · 0.79mm/px · 3 of 161 slices shown]
[im 33/161  lung]
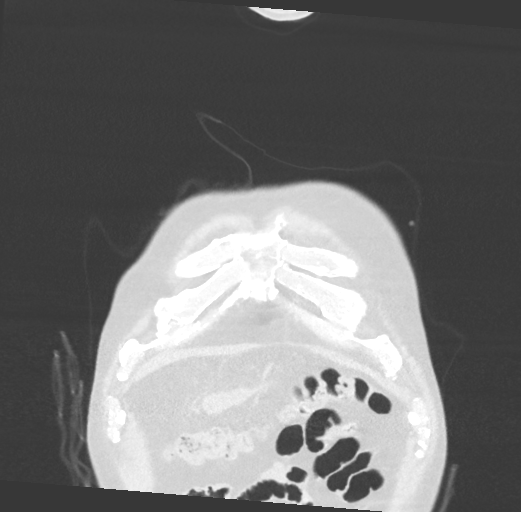
[im 65/161  lung]
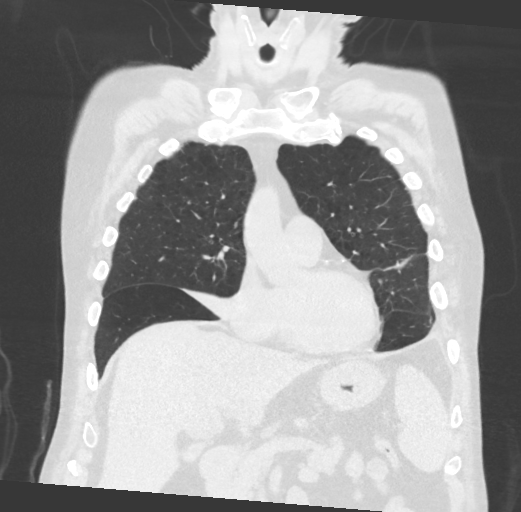
[im 97/161  lung]
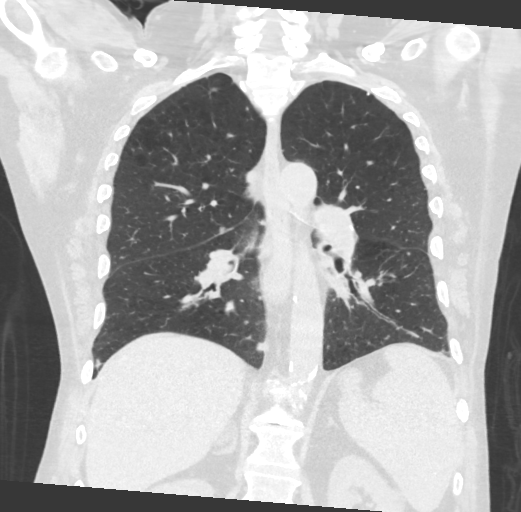

[15 of 36 positions shown; findings below may reference images not displayed]

FINDINGS: Cardiovascular: Normal heart size. No significant pericardial
effusion/thickening. Left anterior descending and left circumflex
coronary atherosclerosis. Atherosclerotic nonaneurysmal thoracic
aorta. Normal caliber pulmonary arteries.

Mediastinum/Nodes: No discrete thyroid nodules. Unremarkable
esophagus. No pathologically enlarged axillary, mediastinal or hilar
lymph nodes, noting limited sensitivity for the detection of hilar
adenopathy on this noncontrast study.

Lungs/Pleura: No pneumothorax. No pleural effusion. Severe
centrilobular emphysema with diffuse bronchial wall thickening.
Innumerable (> than 20) solid pulmonary nodules scattered throughout
both lungs, all new since 09/07/[address] chest CT, largest a pleural
based 1.8 x 1.2 cm nodule in the medial apical left upper lobe with
possible mediastinal invasion (series 5/image 21). Additional
representative pulmonary nodules measure 1.0 cm in the lingula
(series 5/image 86), 1.2 cm in the left lower lobe (series 5/image
106) and 0.9 cm posteriorly in right upper lobe (series 5/image 73).
Complete right middle lobe atelectasis.

Upper abdomen: Indistinct hypodense masslike 8.4 cm left liver dome
focus (series 3/image 141), new. Stable 3.9 cm right adrenal adenoma
with density -10 HU. Partially visualized 3.3 cm anterior upper left
renal simple cyst. Layering hyperdensity in the gallbladder could
represent small gallstones or vicarious excretion of IV contrast.

Musculoskeletal: No aggressive appearing focal osseous lesions.
Moderate thoracic spondylosis.
IMPRESSION: 1. Innumerable (> than 20) solid pulmonary nodules scattered
throughout both lungs, all new since 09/07/[address] chest [address], largest a
pleural based 1.8 cm nodule in the medial apical left upper lobe
with possible associated mediastinal invasion. Findings are most
compatible with pulmonary metastases, although the dominant medial
apical left upper lobe nodule could represent a primary bronchogenic
carcinoma.
2. Indistinct hypodense masslike 8.4 cm left liver dome focus, new,
suspicious for liver metastasis. MRI abdomen without and with IV
contrast recommended for further characterization.
3. Complete right middle lobe atelectasis.
4. Two-vessel coronary atherosclerosis.
5. Stable right adrenal adenoma.
6. Aortic Atherosclerosis (OEG2Y-XPD.D) and Emphysema (OEG2Y-L23.P).

## 2023-10-15 ENCOUNTER — Ambulatory Visit: Payer: Self-pay

## 2024-06-21 ENCOUNTER — Encounter (HOSPITAL_COMMUNITY): Payer: Self-pay | Admitting: Interventional Radiology
